# Patient Record
Sex: Female | Born: 1980
Health system: Southern US, Community
[De-identification: ages and names within clinical notes are randomized; demographics above are authoritative.]

## PROBLEM LIST (undated history)

## (undated) ENCOUNTER — Emergency Department (HOSPITAL_COMMUNITY): Disposition: A | Discharge status: Home / Self Care | Payer: Commercial Managed Care - HMO

## (undated) ENCOUNTER — Inpatient Hospital Stay (HOSPITAL_COMMUNITY): Payer: Self-pay

## (undated) DIAGNOSIS — F32A Depression, unspecified: Secondary | ICD-10-CM

## (undated) DIAGNOSIS — F419 Anxiety disorder, unspecified: Secondary | ICD-10-CM

## (undated) DIAGNOSIS — R896 Abnormal cytological findings in specimens from other organs, systems and tissues: Secondary | ICD-10-CM

## (undated) DIAGNOSIS — N200 Calculus of kidney: Secondary | ICD-10-CM

## (undated) DIAGNOSIS — R112 Nausea with vomiting, unspecified: Secondary | ICD-10-CM

## (undated) DIAGNOSIS — O139 Gestational [pregnancy-induced] hypertension without significant proteinuria, unspecified trimester: Secondary | ICD-10-CM

## (undated) DIAGNOSIS — R87629 Unspecified abnormal cytological findings in specimens from vagina: Secondary | ICD-10-CM

## (undated) DIAGNOSIS — K219 Gastro-esophageal reflux disease without esophagitis: Secondary | ICD-10-CM

## (undated) DIAGNOSIS — F988 Other specified behavioral and emotional disorders with onset usually occurring in childhood and adolescence: Secondary | ICD-10-CM

## (undated) DIAGNOSIS — F329 Major depressive disorder, single episode, unspecified: Secondary | ICD-10-CM

## (undated) DIAGNOSIS — Z8619 Personal history of other infectious and parasitic diseases: Secondary | ICD-10-CM

## (undated) DIAGNOSIS — Z9889 Other specified postprocedural states: Secondary | ICD-10-CM

## (undated) DIAGNOSIS — F319 Bipolar disorder, unspecified: Secondary | ICD-10-CM

## (undated) DIAGNOSIS — Z8759 Personal history of other complications of pregnancy, childbirth and the puerperium: Secondary | ICD-10-CM

## (undated) HISTORY — DX: Personal history of other infectious and parasitic diseases: Z86.19

## (undated) HISTORY — PX: WISDOM TOOTH EXTRACTION: SHX21

## (undated) HISTORY — DX: Abnormal cytological findings in specimens from other organs, systems and tissues: R89.6

## (undated) HISTORY — DX: Personal history of other complications of pregnancy, childbirth and the puerperium: Z87.59

## (undated) HISTORY — DX: Gastro-esophageal reflux disease without esophagitis: K21.9

## (undated) HISTORY — DX: Other specified behavioral and emotional disorders with onset usually occurring in childhood and adolescence: F98.8

## (undated) HISTORY — PX: KNEE ARTHROSCOPY: SUR90

## (undated) HISTORY — DX: Bipolar disorder, unspecified: F31.9

---
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
## 2001-06-19 ENCOUNTER — Other Ambulatory Visit: Admission: RE | Admit: 2001-06-19 | Discharge: 2001-06-19 | Payer: Self-pay | Admitting: Gynecology

## 2001-12-31 ENCOUNTER — Other Ambulatory Visit: Admission: RE | Admit: 2001-12-31 | Discharge: 2001-12-31 | Payer: Self-pay | Admitting: Gynecology

## 2002-06-25 ENCOUNTER — Other Ambulatory Visit: Admission: RE | Admit: 2002-06-25 | Discharge: 2002-06-25 | Payer: Self-pay | Admitting: Gynecology

## 2002-12-24 ENCOUNTER — Other Ambulatory Visit: Admission: RE | Admit: 2002-12-24 | Discharge: 2002-12-24 | Payer: Self-pay | Admitting: Obstetrics and Gynecology

## 2003-12-26 ENCOUNTER — Other Ambulatory Visit: Admission: RE | Admit: 2003-12-26 | Discharge: 2003-12-26 | Payer: Self-pay | Admitting: Gynecology

## 2004-12-26 ENCOUNTER — Other Ambulatory Visit: Admission: RE | Admit: 2004-12-26 | Discharge: 2004-12-26 | Payer: Self-pay | Admitting: Gynecology

## 2005-08-05 DIAGNOSIS — Z8742 Personal history of other diseases of the female genital tract: Secondary | ICD-10-CM

## 2005-08-05 HISTORY — DX: Personal history of other diseases of the female genital tract: Z87.42

## 2005-12-20 ENCOUNTER — Other Ambulatory Visit: Admission: RE | Admit: 2005-12-20 | Discharge: 2005-12-20 | Payer: Self-pay | Admitting: Gynecology

## 2006-05-05 HISTORY — PX: THERAPEUTIC ABORTION: SHX798

## 2007-08-31 ENCOUNTER — Other Ambulatory Visit: Admission: RE | Admit: 2007-08-31 | Discharge: 2007-08-31 | Payer: Self-pay | Admitting: Obstetrics and Gynecology

## 2008-09-12 ENCOUNTER — Encounter: Payer: Self-pay | Admitting: Obstetrics and Gynecology

## 2008-09-12 ENCOUNTER — Ambulatory Visit: Payer: Self-pay | Admitting: Obstetrics and Gynecology

## 2008-09-12 ENCOUNTER — Other Ambulatory Visit: Admission: RE | Admit: 2008-09-12 | Discharge: 2008-09-12 | Payer: Self-pay | Admitting: Obstetrics and Gynecology

## 2009-09-11 ENCOUNTER — Ambulatory Visit: Payer: Self-pay | Admitting: Obstetrics and Gynecology

## 2009-09-11 ENCOUNTER — Other Ambulatory Visit: Admission: RE | Admit: 2009-09-11 | Discharge: 2009-09-11 | Payer: Self-pay | Admitting: Obstetrics and Gynecology

## 2010-07-20 ENCOUNTER — Other Ambulatory Visit
Admission: RE | Admit: 2010-07-20 | Discharge: 2010-07-20 | Payer: Self-pay | Source: Home / Self Care | Admitting: Gynecology

## 2010-07-20 ENCOUNTER — Ambulatory Visit: Payer: Self-pay | Admitting: Gynecology

## 2010-10-12 ENCOUNTER — Ambulatory Visit (INDEPENDENT_AMBULATORY_CARE_PROVIDER_SITE_OTHER): Payer: 59 | Admitting: Women's Health

## 2010-10-12 DIAGNOSIS — R823 Hemoglobinuria: Secondary | ICD-10-CM

## 2010-10-12 DIAGNOSIS — B373 Candidiasis of vulva and vagina: Secondary | ICD-10-CM

## 2010-10-12 DIAGNOSIS — N898 Other specified noninflammatory disorders of vagina: Secondary | ICD-10-CM

## 2011-01-09 ENCOUNTER — Ambulatory Visit (INDEPENDENT_AMBULATORY_CARE_PROVIDER_SITE_OTHER): Payer: 59 | Admitting: Gynecology

## 2011-01-09 DIAGNOSIS — L293 Anogenital pruritus, unspecified: Secondary | ICD-10-CM

## 2011-01-09 DIAGNOSIS — N898 Other specified noninflammatory disorders of vagina: Secondary | ICD-10-CM

## 2011-01-09 DIAGNOSIS — B373 Candidiasis of vulva and vagina: Secondary | ICD-10-CM

## 2011-01-09 DIAGNOSIS — R823 Hemoglobinuria: Secondary | ICD-10-CM

## 2011-07-18 DIAGNOSIS — F319 Bipolar disorder, unspecified: Secondary | ICD-10-CM | POA: Insufficient documentation

## 2011-07-18 DIAGNOSIS — IMO0001 Reserved for inherently not codable concepts without codable children: Secondary | ICD-10-CM | POA: Insufficient documentation

## 2011-07-18 DIAGNOSIS — F988 Other specified behavioral and emotional disorders with onset usually occurring in childhood and adolescence: Secondary | ICD-10-CM | POA: Insufficient documentation

## 2011-07-25 ENCOUNTER — Ambulatory Visit (INDEPENDENT_AMBULATORY_CARE_PROVIDER_SITE_OTHER): Payer: 59 | Admitting: Gynecology

## 2011-07-25 ENCOUNTER — Encounter: Payer: Self-pay | Admitting: Gynecology

## 2011-07-25 VITALS — BP 126/74 | Ht 63.0 in | Wt 165.0 lb

## 2011-07-25 DIAGNOSIS — Z01419 Encounter for gynecological examination (general) (routine) without abnormal findings: Secondary | ICD-10-CM

## 2011-07-25 DIAGNOSIS — R102 Pelvic and perineal pain: Secondary | ICD-10-CM

## 2011-07-25 DIAGNOSIS — B3731 Acute candidiasis of vulva and vagina: Secondary | ICD-10-CM

## 2011-07-25 DIAGNOSIS — N949 Unspecified condition associated with female genital organs and menstrual cycle: Secondary | ICD-10-CM

## 2011-07-25 DIAGNOSIS — R823 Hemoglobinuria: Secondary | ICD-10-CM

## 2011-07-25 DIAGNOSIS — N898 Other specified noninflammatory disorders of vagina: Secondary | ICD-10-CM

## 2011-07-25 DIAGNOSIS — B373 Candidiasis of vulva and vagina: Secondary | ICD-10-CM

## 2011-07-25 MED ORDER — NORGESTIM-ETH ESTRAD TRIPHASIC 0.18/0.215/0.25 MG-35 MCG PO TABS: 1.0000 | ORAL_TABLET | Freq: Every day | ORAL | Status: DC

## 2011-07-25 MED ORDER — FLUCONAZOLE 150 MG PO TABS: 150.0000 mg | ORAL_TABLET | Freq: Once | ORAL | Status: AC

## 2011-07-25 NOTE — Progress Notes (Signed)
Jocelyn Waters Aug 15, 1980 [id number]        30 y.o.  for annual exam.  Does note over the past year she is having some fleeting pelvic pain comes every several days lasts just for several minutes sometimes radiates down her thighs. Not related to activity. She is having some constipation the last month due to dietary changes. Not having any dyspareunia nausea vomiting UTI symptoms. Does think that she has a yeast infection with some mild itching and she is prone to getting yeast infections.  Past medical history,surgical history, medications, allergies, family history and social history were all reviewed and documented in the EPIC chart. ROS:  Was performed and pertinent positives and negatives are included in the history.  Exam: chaperone present Filed Vitals:   07/25/11 1513  BP: 126/74   General appearance  Normal Skin grossly normal Head/Neck normal with no cervical or supraclavicular adenopathy thyroid normal Lungs  clear Cardiac RR, without RMG Abdominal  soft, nontender, without masses, organomegaly or hernia Breasts  examined lying and sitting without masses, retractions, discharge or axillary adenopathy. Pelvic  Ext/BUS/vagina  normal with white discharge  Cervix  normal    Uterus  axial, normal size, shape and contour, midline and mobile nontender   Adnexa  Without masses or tenderness    Anus and perineum  normal   Rectovaginal  normal sphincter tone without palpated masses or tenderness.    Assessment/Plan:  30 y.o. female for annual exam.    1. White discharge. KOH wet prep is positive for yeast. We'll treat with Diflucan 150x1 dose. She does have boric acid suppositories that she does use on a weekly basis and notes that that seems to help she'll continue to do so. 2. Fleeting pelvic pain.  No dyspareunia. She does have some dysmenorrhea. We'll start with ultrasound and then go from there. Possibilities for endometriosis was discussed. If ultrasound is normal options for  laparoscopy were reviewed and we'll rediscuss after the ultrasound. Will check urinalysis. 3. Birth control. She is doing well on birth control pills wants to continue I refilled her tri-Sprintec times a year. 4. Breast self. SBE monthly reviewed. 5. Pap smear. I did not do a Pap smear today. She has no history of significantly abnormal Paps in the past and has numerous normal reports in her chart the last one in 2011. We'll plan less frequent screening every 3 years per current guidelines.  6. Health maintenance. She just saw her primary and blood work was done to include cholesterol. No other blood work was done today she'll continue follow up with him for routine health maintenance.    Dara Lords MD, 4:25 PM 07/25/2011

## 2011-07-25 NOTE — Progress Notes (Signed)
Addended byCammie Mcgee T on: 07/25/2011 05:30 PM   Modules accepted: Orders

## 2011-07-25 NOTE — Patient Instructions (Signed)
Follow up for ultrasound. 

## 2011-07-27 LAB — URINE CULTURE

## 2011-08-13 ENCOUNTER — Ambulatory Visit (INDEPENDENT_AMBULATORY_CARE_PROVIDER_SITE_OTHER): Payer: 59 | Admitting: Gynecology

## 2011-08-13 ENCOUNTER — Encounter: Payer: Self-pay | Admitting: Gynecology

## 2011-08-13 ENCOUNTER — Ambulatory Visit (INDEPENDENT_AMBULATORY_CARE_PROVIDER_SITE_OTHER): Payer: 59

## 2011-08-13 DIAGNOSIS — R102 Pelvic and perineal pain: Secondary | ICD-10-CM

## 2011-08-13 DIAGNOSIS — N949 Unspecified condition associated with female genital organs and menstrual cycle: Secondary | ICD-10-CM

## 2011-08-13 DIAGNOSIS — N8300 Follicular cyst of ovary, unspecified side: Secondary | ICD-10-CM

## 2011-08-13 NOTE — Patient Instructions (Signed)
Increase daily fiber as discussed and fluids. If constipation and or abdominal pain continues then call and we will arrange for a gastroenterology referral.

## 2011-08-13 NOTE — Progress Notes (Signed)
Patient presents for ultrasound due to abdominopelvic discomfort described in her previous office visit note.  Ultrasound today shows endometrial echoes 3.5 mm with a normal uterine echo pattern. Right and left ovaries are visualized and normal. There are no abnormalities seen and no cul-de-sac fluid.  Reviewed results of ultrasound with the patient. I feel her pain is probably GI related as she is having constipation issues. I recommended daily fiber supplements with fluids. If her constipation and or abdominal pain continue, she knows to call me and I will refer to gastroenterology for further evaluation. If her symptoms clear then we'll follow. Patient's comfortable with the plan.

## 2012-01-31 ENCOUNTER — Telehealth: Payer: Self-pay | Admitting: *Deleted

## 2012-01-31 MED ORDER — FLUCONAZOLE 150 MG PO TABS: 150.0000 mg | ORAL_TABLET | Freq: Once | ORAL | Status: AC

## 2012-01-31 NOTE — Telephone Encounter (Signed)
Diflucan 150mg x 1 dose

## 2012-01-31 NOTE — Telephone Encounter (Signed)
Left on voicemail rx sent. 

## 2012-01-31 NOTE — Telephone Encounter (Signed)
Pt calling c/o recurrent yeast infection itching, white discharge x 2 days now, pt is requesting Rx. Last yeast infection back in 07/25/11. Please advise

## 2012-05-01 ENCOUNTER — Other Ambulatory Visit: Payer: Self-pay | Admitting: Gynecology

## 2012-05-01 ENCOUNTER — Ambulatory Visit (INDEPENDENT_AMBULATORY_CARE_PROVIDER_SITE_OTHER): Payer: 59 | Admitting: Gynecology

## 2012-05-01 ENCOUNTER — Encounter: Payer: Self-pay | Admitting: Gynecology

## 2012-05-01 ENCOUNTER — Telehealth: Payer: Self-pay | Admitting: Gynecology

## 2012-05-01 DIAGNOSIS — N898 Other specified noninflammatory disorders of vagina: Secondary | ICD-10-CM

## 2012-05-01 DIAGNOSIS — L738 Other specified follicular disorders: Secondary | ICD-10-CM

## 2012-05-01 DIAGNOSIS — Z309 Encounter for contraceptive management, unspecified: Secondary | ICD-10-CM

## 2012-05-01 DIAGNOSIS — Z3049 Encounter for surveillance of other contraceptives: Secondary | ICD-10-CM

## 2012-05-01 DIAGNOSIS — L678 Other hair color and hair shaft abnormalities: Secondary | ICD-10-CM

## 2012-05-01 DIAGNOSIS — L739 Follicular disorder, unspecified: Secondary | ICD-10-CM

## 2012-05-01 LAB — WET PREP FOR TRICH, YEAST, CLUE
Clue Cells Wet Prep HPF POC: NONE SEEN
Yeast Wet Prep HPF POC: NONE SEEN

## 2012-05-01 MED ORDER — LEVONORGESTREL 20 MCG/24HR IU IUD: INTRAUTERINE_SYSTEM | Freq: Once | INTRAUTERINE | Status: DC

## 2012-05-01 NOTE — Progress Notes (Signed)
Patient presents with several issues: 1. Vaginal discharge with some irritation. No odor. Just started period today. 2. ? Ingrown hair off although with discomfort started several days ago. 3. Contraceptive management with questions about options.  Exam with Fleet Contras assistant External with 1-2 cm boil/folliculitis left upper labia majora with some fluctuance, overlying erythema no pointing. No overt cellulitis or inguinal adenopathy. Vagina with cakey discharge and blood. Cervix normal. Uterus normal size midline mobile nontender. Adnexa without masses or tenderness.  Assessment and plan: 1. Vaginal discharge. Wet prep is unremarkable although she is starting.Jovita Gamma her sample of Gynazole-1 to treat suspected yeast. Follow up if symptoms persist or recur. 2. Folliculitis/boil. Recommended warm soaks to the area. If worsens or spreads office visit for reevaluation. If drains and resolves and follow. Do not feel antibiotics at this point warranted. 3. Contraceptive management. I reviewed options with her to include patch ring, Implanon, IUDs both Mirena and ParaGard, sterilization. She's contemplating marriage with possible pregnancy in the future. I gave her literature on Mirena and she is interested in considering initial check with her insurance. I reviewed the insertional process with her and the general risks.  Patient will follow up if she decides to pursue otherwise she is due for her annual in December and knows to schedule this and follow up at that time.

## 2012-05-01 NOTE — Telephone Encounter (Signed)
Pt advised that her [employer] ins covers the Mirena IUD & its insertion at 100%,no copay.Appt is scheduled for 05/04/12 with TF/WL

## 2012-05-01 NOTE — Patient Instructions (Signed)
Warm compresses to vulvar area.  Office visit if area remains tender or enlarges.

## 2012-05-04 ENCOUNTER — Ambulatory Visit (INDEPENDENT_AMBULATORY_CARE_PROVIDER_SITE_OTHER): Payer: 59 | Admitting: Gynecology

## 2012-05-04 ENCOUNTER — Encounter: Payer: Self-pay | Admitting: Gynecology

## 2012-05-04 DIAGNOSIS — Z30430 Encounter for insertion of intrauterine contraceptive device: Secondary | ICD-10-CM

## 2012-05-04 HISTORY — PX: INTRAUTERINE DEVICE INSERTION: SHX323

## 2012-05-04 NOTE — Patient Instructions (Addendum)
Intrauterine Device Insertion Most often, an intrauterine device (IUD) is inserted into the uterus to prevent pregnancy. There are 2 types of IUDs available:  Copper IUD. This type of IUD creates an environment that is not favorable to sperm survival. The mechanism of action of the copper IUD is not known for certain. It can stay in place for 10 years.   Hormone IUD. This type of IUD contains the hormone progestin (synthetic progesterone). The progestin thickens the cervical mucus and prevents sperm from entering the uterus, and it also thins the uterine lining. There is no evidence that the hormone IUD prevents implantation. The hormone IUD can stay in place for up to 5 years.  An IUD is the most cost-effective birth control if left in place for the full duration. It may be removed at any time. LET YOUR CAREGIVER KNOW ABOUT:  Sensitivity to metals.   Medicines taken including herbs, eyedrops, over-the-counter medicines, and creams.   Use of steroids (by mouth or creams).   Previous problems with anesthetics or numbing medicine.   Previous gynecological surgery.   History of blood clots or clotting disorders.   Possibility of pregnancy.   Menstrual irregularities.   Concerns regarding unusual vaginal discharge or odors.   Previous experience with an IUD.   Other health problems.  RISKS AND COMPLICATIONS  Accidental puncture (perforation) of the uterus.   Accidental placement of the IUD either in the muscle layer of the uterus (myometrium) or outside the uterus. If this happen, the IUD can be found essentially floating around the bowels. When this happens, the IUD must be taken out surgically.   The IUD may fall out of the uterus (expulsion). This is more common in women who have recently had a child.    Pregnancy in the fallopian tube (ectopic).  BEFORE THE PROCEDURE  Schedule the IUD insertion for when you will have your menstrual period or right after, to make sure you  are not pregnant. Placement of the IUD is better tolerated shortly after a menstrual cycle.   You may need to take tests or be examined to make sure you are not pregnant.   You may be required to take a pregnancy test.   You may be required to get checked for sexually transmitted infections (STIs) prior to placement. Placing an IUD in someone who has an infection can make an infection worse.   You may be given a pain reliever to take 1 or 2 hours before the procedure.   An exam will be performed to determine the size and position of your uterus.   Ask your caregiver about changing or stopping your regular medicines.  PROCEDURE   A tool (speculum) is placed in the vagina. This allows your caregiver to see the lower part of the uterus (cervix).   The cervix is prepped with a medicine that lowers the risk of infection.   You may be given a medicine to numb each side of the cervix (intracervical or paracervical block). This is used to block and control any discomfort with insertion.   A tool (uterine sound) is inserted into the uterus to determine the length of the uterine cavity and the direction the uterus may be tilted.   A slim instrument (IUD inserter) is inserted through the cervical canal and into your uterus.   The IUD is placed in the uterine cavity and the insertion device is removed.   The nylon string that is attached to the IUD, and used for   eventual IUD removal, is trimmed. It is trimmed so that it lays high in the vagina, just outside the cervix.  AFTER THE PROCEDURE  You may have bleeding after the procedure. This is normal. It varies from light spotting for a few days to menstrual-like bleeding.   You may have mild cramping.   Practice checking the string coming out of the cervix to make sure the IUD remains in the uterus. If you cannot feel the string, you should schedule a "string check" with your caregiver.   If you had a hormone IUD inserted, expect that your  period may be lighter or nonexistent within a year's time (though this is not always the case). There may be delayed fertility with the hormone IUD as a result of its progesterone effect. When you are ready to become pregnant, it is suggested to have the IUD removed up to 1 year in advance.   Yearly exams are advised.  Document Released: 03/20/2011 Document Revised: 07/11/2011 Document Reviewed: 03/20/2011 Exit[employer] Patient Information 2012 [employer]. 

## 2012-05-04 NOTE — Progress Notes (Signed)
Patient presents for Mirena IUD placement. She has read through the booklet, has no contraindications and signed the consent form. She currently is on a normal menses.  I reviewed the insertional process with her as well as the risks to include infection either immediate or long-term, uterine perforation or migration requiring surgery to remove, other complications such as pain, hormonal side effects, infertility and possibilities of failure with subsequent pregnancy.    Exam with Sherrilyn Rist assistant Pelvic: External BUS vagina normal. Cervix normal with normal menses flow. Uterus anteverted, normal size shape contour midline mobile nontender. Adnexa without masses or tenderness.  Procedure: The cervix was cleansed with Betadine, anterior lip grasped with a single-tooth tenaculum, the uterus was sounded and a Mirena IUD was placed according to manufacturer's recommendations without difficulty. The strings were trimmed. The patient tolerated well and will follow up in one month for a postinsertional check.  Lot number: [id number]

## 2012-05-05 ENCOUNTER — Encounter: Payer: Self-pay | Admitting: Gynecology

## 2012-06-01 ENCOUNTER — Ambulatory Visit (INDEPENDENT_AMBULATORY_CARE_PROVIDER_SITE_OTHER): Payer: 59 | Admitting: Gynecology

## 2012-06-01 ENCOUNTER — Encounter: Payer: Self-pay | Admitting: Gynecology

## 2012-06-01 DIAGNOSIS — Z30431 Encounter for routine checking of intrauterine contraceptive device: Secondary | ICD-10-CM

## 2012-06-01 NOTE — Patient Instructions (Signed)
Intrauterine Device Insertion Most often, an intrauterine device (IUD) is inserted into the uterus to prevent pregnancy. There are 2 types of IUDs available:  Copper IUD. This type of IUD creates an environment that is not favorable to sperm survival. The mechanism of action of the copper IUD is not known for certain. It can stay in place for 10 years.  Hormone IUD. This type of IUD contains the hormone progestin (synthetic progesterone). The progestin thickens the cervical mucus and prevents sperm from entering the uterus, and it also thins the uterine lining. There is no evidence that the hormone IUD prevents implantation. The hormone IUD can stay in place for up to 5 years. An IUD is the most cost-effective birth control if left in place for the full duration. It may be removed at any time. LET YOUR CAREGIVER KNOW ABOUT:  Sensitivity to metals.  Medicines taken including herbs, eyedrops, over-the-counter medicines, and creams.  Use of steroids (by mouth or creams).  Previous problems with anesthetics or numbing medicine.  Previous gynecological surgery.  History of blood clots or clotting disorders.  Possibility of pregnancy.  Menstrual irregularities.  Concerns regarding unusual vaginal discharge or odors.  Previous experience with an IUD.  Other health problems. RISKS AND COMPLICATIONS  Accidental puncture (perforation) of the uterus.  Accidental placement of the IUD either in the muscle layer of the uterus (myometrium) or outside the uterus. If this happen, the IUD can be found essentially floating around the bowels. When this happens, the IUD must be taken out surgically.  The IUD may fall out of the uterus (expulsion). This is more common in women who have recently had a child.   Pregnancy in the fallopian tube (ectopic). BEFORE THE PROCEDURE  Schedule the IUD insertion for when you will have your menstrual period or right after, to make sure you are not pregnant.  Placement of the IUD is better tolerated shortly after a menstrual cycle.  You may need to take tests or be examined to make sure you are not pregnant.  You may be required to take a pregnancy test.  You may be required to get checked for sexually transmitted infections (STIs) prior to placement. Placing an IUD in someone who has an infection can make an infection worse.  You may be given a pain reliever to take 1 or 2 hours before the procedure.  An exam will be performed to determine the size and position of your uterus.  Ask your caregiver about changing or stopping your regular medicines. PROCEDURE   A tool (speculum) is placed in the vagina. This allows your caregiver to see the lower part of the uterus (cervix).  The cervix is prepped with a medicine that lowers the risk of infection.  You may be given a medicine to numb each side of the cervix (intracervical or paracervical block). This is used to block and control any discomfort with insertion.  A tool (uterine sound) is inserted into the uterus to determine the length of the uterine cavity and the direction the uterus may be tilted.  A slim instrument (IUD inserter) is inserted through the cervical canal and into your uterus.  The IUD is placed in the uterine cavity and the insertion device is removed.  The nylon string that is attached to the IUD, and used for eventual IUD removal, is trimmed. It is trimmed so that it lays high in the vagina, just outside the cervix. AFTER THE PROCEDURE  You may have bleeding after the  attached to the IUD, and used for eventual IUD removal, is trimmed. It is trimmed so that it lays high in the vagina, just outside the cervix.  AFTER THE PROCEDURE  · You may have bleeding after the procedure. This is normal. It varies from light spotting for a few days to menstrual-like bleeding.   · You may have mild cramping.  · Practice checking the string coming out of the cervix to make sure the IUD remains in the uterus. If you cannot feel the string, you should schedule a "string check" with your caregiver.  · If you had a hormone IUD inserted, expect that your period may be lighter or nonexistent  within a year's time (though this is not always the case). There may be delayed fertility with the hormone IUD as a result of its progesterone effect. When you are ready to become pregnant, it is suggested to have the IUD removed up to 1 year in advance.  · Yearly exams are advised.  Document Released: 03/20/2011 Document Revised: 10/14/2011 Document Reviewed: 03/20/2011  Exit[employer]® Patient Information ©2013 [employer].

## 2012-06-01 NOTE — Progress Notes (Signed)
Patient presents for IUD follow up. That Mirena placed 05/04/2012. Has had some spotting on and off but overall is doing well.  Exam with Sherrilyn Rist Asst. Abdomen soft nontender without masses guarding rebound organomegaly. Pelvic external BUS vagina with dark brown staining. Cervix normal with IUD string visualized and appropriate length. Uterus normal size midline mobile nontender. Adnexa without masses or tenderness.  Assessment and plan: IUD check up doing well. Assuming spotting resolves, she's due for her annual in December and will follow up for this. Follow up sooner if any issues.

## 2012-07-30 ENCOUNTER — Encounter: Payer: Self-pay | Admitting: Gynecology

## 2012-07-30 ENCOUNTER — Ambulatory Visit (INDEPENDENT_AMBULATORY_CARE_PROVIDER_SITE_OTHER): Payer: 59 | Admitting: Gynecology

## 2012-07-30 VITALS — BP 124/82 | Ht 63.0 in | Wt 145.0 lb

## 2012-07-30 DIAGNOSIS — Z13220 Encounter for screening for lipoid disorders: Secondary | ICD-10-CM

## 2012-07-30 DIAGNOSIS — Z131 Encounter for screening for diabetes mellitus: Secondary | ICD-10-CM

## 2012-07-30 DIAGNOSIS — Z01419 Encounter for gynecological examination (general) (routine) without abnormal findings: Secondary | ICD-10-CM

## 2012-07-30 DIAGNOSIS — F3289 Other specified depressive episodes: Secondary | ICD-10-CM

## 2012-07-30 DIAGNOSIS — F329 Major depressive disorder, single episode, unspecified: Secondary | ICD-10-CM

## 2012-07-30 LAB — CBC WITH DIFFERENTIAL/PLATELET
Basophils Relative: 1 % (ref 0–1)
Eosinophils Absolute: 0.1 10*3/uL (ref 0.0–0.7)
HCT: 39.9 % (ref 36.0–46.0)
Hemoglobin: 13.7 g/dL (ref 12.0–15.0)
Lymphs Abs: 2.1 10*3/uL (ref 0.7–4.0)
MCH: 29.3 pg (ref 26.0–34.0)
MCHC: 34.3 g/dL (ref 30.0–36.0)
MCV: 85.4 fL (ref 78.0–100.0)
Monocytes Absolute: 0.5 10*3/uL (ref 0.1–1.0)
Monocytes Relative: 7 % (ref 3–12)
Neutrophils Relative %: 60 % (ref 43–77)
RBC: 4.67 MIL/uL (ref 3.87–5.11)

## 2012-07-30 LAB — GLUCOSE, RANDOM: Glucose, Bld: 82 mg/dL (ref 70–99)

## 2012-07-30 LAB — LIPID PANEL
HDL: 48 mg/dL (ref >39)
Total CHOL/HDL Ratio: 3.6 Ratio
VLDL: 37 mg/dL (ref 0–40)

## 2012-07-30 LAB — TSH: TSH: 1.6 u[IU]/mL (ref 0.350–4.500)

## 2012-07-30 LAB — FOLLICLE STIMULATING HORMONE: FSH: 9.3 m[IU]/mL

## 2012-07-30 NOTE — Patient Instructions (Signed)
You can obtain your lab results by logging on to my chart. Follow up in one year, sooner as needed.

## 2012-07-30 NOTE — Progress Notes (Signed)
Jocelyn Waters Dec 16, 1980 [id number]        31 y.o.  G1P0010 for annual exam.  Several issues noted below.  Past medical history,surgical history, medications, allergies, family history and social history were all reviewed and documented in the EPIC chart. ROS:  Was performed and pertinent positives and negatives are included in the history.  Exam: Fleet Contras assistant Filed Vitals:   07/30/12 1037 07/30/12 1102  BP: 142/90 124/82  Height: 5\' 3"  (1.6 m)   Weight: 145 lb (65.772 kg)    General appearance  Normal Skin grossly normal Head/Neck normal with no cervical or supraclavicular adenopathy thyroid normal Lungs  clear Cardiac RR, without RMG Abdominal  soft, nontender, without masses, organomegaly or hernia Breasts  examined lying and sitting without masses, retractions, discharge or axillary adenopathy. Pelvic  Ext/BUS/vagina  normal   Cervix  normal IUD string visualized  Uterus  anteverted, normal size, shape and contour, midline and mobile nontender   Adnexa  Without masses or tenderness    Anus and perineum  normal   Rectovaginal  normal sphincter tone without palpated masses or tenderness.    Assessment/Plan:  31 y.o. G33P0010 female for annual exam.   1. Mirena IUD 04/2012. Scant to absent menses. Overall doing well we'll continue to monitor. IUD string visualized. 2. Anxiety/depression. Having a slight flare now. Wondered if it was hormonally related. Is being followed for bipolar disorder and ADD.  Check baseline TSH FSH. Otherwise recommended follow up with her psychiatry group who she sees on a regular basis. 3. Breast health. SBE monthly reviewed. 4. Pap smear 07/2010. No Pap smear done today. No history of significant abnormal Pap smears. Plan repeat next year at 3 year interval. 5. Health maintenance. Blood pressure initially 142/90. Retake 124/82. Baseline CBC lipid profile glucose urinalysis ordered. Follow up one year, sooner as needed.    Dara Lords MD,  11:23 AM 07/30/2012

## 2012-07-31 LAB — URINALYSIS W MICROSCOPIC + REFLEX CULTURE
Bilirubin Urine: NEGATIVE
Glucose, UA: NEGATIVE mg/dL
Protein, ur: NEGATIVE mg/dL
Urobilinogen, UA: 0.2 mg/dL (ref 0.0–1.0)

## 2012-08-01 LAB — URINE CULTURE: Colony Count: 5000

## 2012-09-14 ENCOUNTER — Encounter: Payer: Self-pay | Admitting: Gynecology

## 2012-12-21 ENCOUNTER — Ambulatory Visit (INDEPENDENT_AMBULATORY_CARE_PROVIDER_SITE_OTHER): Payer: 59 | Admitting: Women's Health

## 2012-12-21 ENCOUNTER — Encounter: Payer: Self-pay | Admitting: Women's Health

## 2012-12-21 DIAGNOSIS — R102 Pelvic and perineal pain: Secondary | ICD-10-CM

## 2012-12-21 DIAGNOSIS — N949 Unspecified condition associated with female genital organs and menstrual cycle: Secondary | ICD-10-CM

## 2012-12-21 LAB — URINALYSIS W MICROSCOPIC + REFLEX CULTURE
Bacteria, UA: NONE SEEN
Bilirubin Urine: NEGATIVE
Glucose, UA: NEGATIVE mg/dL
Ketones, ur: NEGATIVE mg/dL
Protein, ur: NEGATIVE mg/dL
Urobilinogen, UA: 0.2 mg/dL (ref 0.0–1.0)
WBC, UA: NONE SEEN WBC/hpf (ref <3)

## 2012-12-21 LAB — WET PREP FOR TRICH, YEAST, CLUE
Trich, Wet Prep: NONE SEEN
Yeast Wet Prep HPF POC: NONE SEEN

## 2012-12-21 MED ORDER — IBUPROFEN 600 MG PO TABS: 600.0000 mg | ORAL_TABLET | Freq: Three times a day (TID) | ORAL | Status: DC | PRN

## 2012-12-21 NOTE — Progress Notes (Signed)
Patient ID: Jocelyn Waters, female   DOB: 12/01/1980, 32 y.o.   MRN: [id number] Presents with complaint of pelvic pressure with a pinching sensation for several weeks. Rates pain at 4-5. Mirena IUD placed 04/2012 with rare spotting. Has had some nausea, no vomiting, denies urinary symptoms of pain, burning or frequency. Denies a fever. Same partner.  Exam: Appears well, external genitalia within normal limits, speculum exam scant white discharge, wet prep negative, IUD string not visible, bimanual - tenderness throughout the exam, no adnexal fullness. UA: Negative. U PT negative.  Pelvic pain History of anxiety/bipolar disease  Plan: Motrin 600 every 8 hours as needed for pain, schedule ultrasound.

## 2012-12-22 LAB — URINE CULTURE
Colony Count: NO GROWTH
Organism ID, Bacteria: NO GROWTH

## 2012-12-23 ENCOUNTER — Ambulatory Visit (INDEPENDENT_AMBULATORY_CARE_PROVIDER_SITE_OTHER): Payer: 59 | Admitting: Women's Health

## 2012-12-23 ENCOUNTER — Ambulatory Visit (INDEPENDENT_AMBULATORY_CARE_PROVIDER_SITE_OTHER): Payer: 59

## 2012-12-23 ENCOUNTER — Other Ambulatory Visit: Payer: Self-pay | Admitting: Women's Health

## 2012-12-23 DIAGNOSIS — R102 Pelvic and perineal pain: Secondary | ICD-10-CM

## 2012-12-23 DIAGNOSIS — N83209 Unspecified ovarian cyst, unspecified side: Secondary | ICD-10-CM

## 2012-12-23 DIAGNOSIS — N949 Unspecified condition associated with female genital organs and menstrual cycle: Secondary | ICD-10-CM

## 2012-12-23 DIAGNOSIS — Z975 Presence of (intrauterine) contraceptive device: Secondary | ICD-10-CM

## 2012-12-23 DIAGNOSIS — N839 Noninflammatory disorder of ovary, fallopian tube and broad ligament, unspecified: Secondary | ICD-10-CM

## 2012-12-23 DIAGNOSIS — N831 Corpus luteum cyst of ovary, unspecified side: Secondary | ICD-10-CM

## 2012-12-23 DIAGNOSIS — N8300 Follicular cyst of ovary, unspecified side: Secondary | ICD-10-CM

## 2012-12-23 NOTE — Progress Notes (Signed)
Patient ID: Jocelyn Waters, female   DOB: 11-Jan-1981, 32 y.o.   MRN: [id number] Presents for ultrasound. Was seen in the office with complaint of pinching sensation/ pain with certain movements 2 days ago. Mirena IUD with rare cycle. History of anxiety.  Ultrasound: Anteverted uterus with IUD seen in normal position. Uterus deviated slightly to the right. Right ovary with thickwalled cystic mass with irregular shape border, avascular 31 x 23 x 18 mm. Left ovary normal. Negative cul-de-sac.  Probable functional cyst.   Plan: Continue Motrin 600 every 8 hours as needed. Repeat ultrasound in 2 months to check for resolution of cyst.

## 2013-02-15 ENCOUNTER — Other Ambulatory Visit: Payer: 59

## 2013-02-15 ENCOUNTER — Encounter: Payer: Self-pay | Admitting: Women's Health

## 2013-02-15 ENCOUNTER — Ambulatory Visit: Payer: 59 | Admitting: Women's Health

## 2013-02-15 ENCOUNTER — Ambulatory Visit (INDEPENDENT_AMBULATORY_CARE_PROVIDER_SITE_OTHER): Payer: 59

## 2013-02-15 ENCOUNTER — Ambulatory Visit (INDEPENDENT_AMBULATORY_CARE_PROVIDER_SITE_OTHER): Payer: 59 | Admitting: Women's Health

## 2013-02-15 DIAGNOSIS — N8300 Follicular cyst of ovary, unspecified side: Secondary | ICD-10-CM

## 2013-02-15 DIAGNOSIS — N83209 Unspecified ovarian cyst, unspecified side: Secondary | ICD-10-CM

## 2013-02-15 NOTE — Progress Notes (Signed)
Patient ID: Jocelyn Waters, female   DOB: February 28, 1981, 32 y.o.   MRN: [id number] Presents for followup ultrasound. Was noted to have an avascular 31 x 23 x 18 mm cyst 12/2012 that was causing pain. Mirena IUD with rare cycles. Pain free, no complaints today  Ultrasound: Anteverted uterus deviates to the right. IUD seen in normal position. Right and left ovary normal with follicles. Previous cyst is resolved.  Normal GYN ultrasound with IUD  Plan: Reviewed normality of ultrasound. Annual exam in December or if problems sooner.

## 2013-06-05 HISTORY — PX: IUD REMOVAL: SHX5392

## 2013-06-10 ENCOUNTER — Other Ambulatory Visit: Payer: Self-pay

## 2013-06-22 ENCOUNTER — Ambulatory Visit (INDEPENDENT_AMBULATORY_CARE_PROVIDER_SITE_OTHER): Payer: 59 | Admitting: Women's Health

## 2013-06-22 ENCOUNTER — Encounter: Payer: Self-pay | Admitting: Women's Health

## 2013-06-22 DIAGNOSIS — Z30432 Encounter for removal of intrauterine contraceptive device: Secondary | ICD-10-CM

## 2013-06-22 DIAGNOSIS — Z975 Presence of (intrauterine) contraceptive device: Secondary | ICD-10-CM

## 2013-06-22 NOTE — Progress Notes (Signed)
Patient ID: Jocelyn Waters, female   DOB: 1980/11/25, 32 y.o.   MRN: [id number] Presents to have Mirena IUD removed. Rare cycle, IUD placed September 2013. Would like to try to conceive.   Exam: Appears well, external genitalia within normal limits, speculum exam cervix pink IUD strings not visible IUD grasped with curved forceps in cervical os removed intact shown to patient and discarded. Tolerated well. Bimanual no CMT or adnexal tenderness.  Mirena IUD removal/Preconceptual counseling  Plan: Prenatal vitamin daily, avoid over-the-counter medications, alcohol,  Adderall or Xanax when trying to conceive. Safe pregnancy behaviors reviewed. Reviewed we no longer deliver, viability ultrasound after missed cycle. Reviewed may take several months to get a regular cycle.

## 2013-08-04 ENCOUNTER — Encounter: Payer: 59 | Admitting: Women's Health

## 2013-08-18 ENCOUNTER — Encounter: Payer: Self-pay | Admitting: Women's Health

## 2013-08-18 ENCOUNTER — Ambulatory Visit (INDEPENDENT_AMBULATORY_CARE_PROVIDER_SITE_OTHER): Payer: 59 | Admitting: Women's Health

## 2013-08-18 ENCOUNTER — Other Ambulatory Visit (HOSPITAL_COMMUNITY)
Admission: RE | Admit: 2013-08-18 | Discharge: 2013-08-18 | Disposition: A | Discharge status: Home / Self Care | Payer: 59 | Source: Ambulatory Visit | Attending: Gynecology | Admitting: Gynecology

## 2013-08-18 VITALS — BP 122/72 | Ht 62.75 in | Wt 168.4 lb

## 2013-08-18 DIAGNOSIS — Z01419 Encounter for gynecological examination (general) (routine) without abnormal findings: Secondary | ICD-10-CM

## 2013-08-18 NOTE — Addendum Note (Signed)
Addended by: Richardson ChiquitoWILKINSON, KARI S on: 08/18/2013 02:53 PM   Modules accepted: Orders

## 2013-08-18 NOTE — Patient Instructions (Signed)

## 2013-08-18 NOTE — Progress Notes (Signed)
Jocelyn Waters 03/12/1981 [id number]    History:    The patient presents for annual exam.  Mirena IUD removed 06/2013 and having regular monthly cycle since. Desiring conception. Normal Pap history. Aware of safe pregnancy behaviors. Currently on prenatal vitamin only. History of anxiety and depression in the past.  Past medical history, past surgical history, family history and social history were all reviewed and documented in the EPIC chart.   ROS:  A  ROS was performed and pertinent positives and negatives are included in the history.  Exam:  Filed Vitals:   08/18/13 0945  BP: 122/72    General appearance:  Normal Head/Neck:  Normal, without cervical or supraclavicular adenopathy. Thyroid:  Symmetrical, normal in size, without palpable masses or nodularity. Respiratory  Effort:  Normal  Auscultation:  Clear without wheezing or rhonchi Cardiovascular  Auscultation:  Regular rate, without rubs, murmurs or gallops  Edema/varicosities:  Not grossly evident Abdominal  Soft,nontender, without masses, guarding or rebound.  Liver/spleen:  No organomegaly noted  Hernia:  None appreciated  Skin  Inspection:  Grossly normal  Palpation:  Grossly normal Neurologic/psychiatric  Orientation:  Normal with appropriate conversation.  Mood/affect:  Normal  Genitourinary    Breasts: Examined lying and sitting.     Right: Without masses, retractions, discharge or axillary adenopathy.     Left: Without masses, retractions, discharge or axillary adenopathy.   Inguinal/mons:  Normal without inguinal adenopathy  External genitalia:  Normal  BUS/Urethra/Skene's glands:  Normal  Bladder:  Normal  Vagina:  Normal  Cervix:  Normal  Uterus:   normal in size, shape and contour.  Midline and mobile  Adnexa/parametria:     Rt: Without masses or tenderness.   Lt: Without masses or tenderness.  Anus and perineum: Normal  Digital rectal exam: Normal sphincter tone without palpated masses or  tenderness  Assessment/Plan:  33 y.o. MWF G0 for annual exam.    Desiring conception History of anxiety and depression on no medication  Plan: Reviewed ovulation predictors, instructed to call or return to office with missed cycle for viability ultrasound. Aware we no longer deliver. Continue prenatal vitamin daily, safe pregnancy behaviors. SBE's, regular exercise, calcium rich diet encouraged. CBC, UA, Pap. Pap normal 2011, new screening guidelines reviewed.    Harrington ChallengerYOUNG,NANCY J [facility] SouthwestWHNP, 10:43 AM 08/18/2013

## 2014-03-07 ENCOUNTER — Telehealth: Payer: Self-pay | Admitting: *Deleted

## 2014-03-07 NOTE — Telephone Encounter (Signed)
Pt left message regarding her questions on conception KW CMA

## 2014-03-23 NOTE — Telephone Encounter (Signed)
Pt never called back. KW CMA 

## 2014-03-30 ENCOUNTER — Ambulatory Visit (INDEPENDENT_AMBULATORY_CARE_PROVIDER_SITE_OTHER): Payer: 59 | Admitting: Women's Health

## 2014-03-30 ENCOUNTER — Encounter: Payer: Self-pay | Admitting: Women's Health

## 2014-03-30 DIAGNOSIS — N926 Irregular menstruation, unspecified: Secondary | ICD-10-CM

## 2014-03-30 LAB — TSH: TSH: 2.119 u[IU]/mL (ref 0.350–4.500)

## 2014-03-30 NOTE — Progress Notes (Signed)
Patient ID: Jocelyn Waters, female   DOB: February 17, 1981, 33 y.o.   MRN: [id number] Presents with complaint of not conceiving after 9 months of unprotected intercourse. Monthly cycle every 24-29 days for 2-3 days. States cycles are lighter than they had been in the past. Ovulation predictor kits are showing ovulation, having intercourse every other day, day 7 through 21  most months. Reports husband is healthy.  Exam: Appears well  Desiring conception  Plan: TSH and prolactin today, semen analysis will get scheduled, day 3 of next cycle FSH and estradiol, day 22-25 progesterone level. If all  tests normal proceed to a hysterosalpingogram, reviewed importance of antibiotic day before, day of, day after procedure. If all normal referral to [facility]. Continue prenatal vitamin daily and healthy lifestyle.

## 2014-03-31 ENCOUNTER — Encounter: Payer: Self-pay | Admitting: Women's Health

## 2014-03-31 LAB — PROLACTIN: Prolactin: 8 ng/mL

## 2014-04-12 ENCOUNTER — Other Ambulatory Visit: Payer: 59

## 2014-04-12 DIAGNOSIS — N926 Irregular menstruation, unspecified: Secondary | ICD-10-CM

## 2014-04-13 ENCOUNTER — Encounter: Payer: Self-pay | Admitting: Women's Health

## 2014-04-13 LAB — PROGESTERONE: PROGESTERONE: 9.9 ng/mL

## 2014-04-14 ENCOUNTER — Telehealth: Payer: Self-pay

## 2014-04-14 NOTE — Telephone Encounter (Signed)
Thanks

## 2014-04-14 NOTE — Telephone Encounter (Signed)
At Maryelizabeth Rowan, NP request I scheduled patient's husband an appointment with [facility] for 05/20/14 at 8:00am with Dr. Marcine Matar.  Semen analysis faxed to them. Koya informed.  I faxed medical records along with referral request form to Dr. Lyndal Rainbow office. Nala knows she will hear from them with an appointment date/time after they review her records.

## 2014-04-15 ENCOUNTER — Telehealth: Payer: Self-pay

## 2014-04-15 NOTE — Telephone Encounter (Signed)
Dr. Lyndal Rainbow office called. Patient's appt is 05/20/14 at 1:30pm at the [facility] office. I called patient and left message although I do believe they called her to schedule it.

## 2014-04-18 ENCOUNTER — Encounter: Payer: Self-pay | Admitting: Gynecology

## 2014-04-18 ENCOUNTER — Ambulatory Visit (INDEPENDENT_AMBULATORY_CARE_PROVIDER_SITE_OTHER): Payer: 59 | Admitting: Gynecology

## 2014-04-18 DIAGNOSIS — N912 Amenorrhea, unspecified: Secondary | ICD-10-CM

## 2014-04-18 LAB — PREGNANCY, URINE: Preg Test, Ur: POSITIVE

## 2014-04-18 NOTE — Progress Notes (Signed)
Jocelyn Waters 06-Jan-1981 [id number]        33 y.o.  G1P0010 presents 1 day late for her menses. Has been trying to achieve pregnancy. Is on a prenatal vitamin. Also takes zyrtec but no other medication.  Past medical history,surgical history, problem list, medications, allergies, family history and social history were all reviewed and documented in the EPIC chart.  Directed ROS with pertinent positives and negatives documented in the history of present illness/assessment and plan.  Exam: Kim assistant General appearance:  Normal Abdomen soft nontender without masses guarding rebound organomegaly. Pelvic external BUS vagina normal. Cervix normal. Uterus grossly normal in size midline mobile nontender. Adnexa without masses or tenderness.  Assessment/Plan:  33 y.o. G1P0010 late for her menses x1 day. Positive UPT. Schedule ultrasound for viability in 2 weeks. Routine early prenatal counseling reviewed with avoidance issues discussed. Patient understands we do not do obstetrics and will make an appointment to see an obstetrician for her new OB appointment following her ultrasound for viability.   Note: This document was prepared with digital dictation and possible smart phrase technology. Any transcriptional errors that result from this process are unintentional.   Dara Lords MD, 2:13 PM 04/18/2014

## 2014-04-18 NOTE — Patient Instructions (Signed)
Follow up for ultrasound as scheduled 

## 2014-05-04 ENCOUNTER — Ambulatory Visit (INDEPENDENT_AMBULATORY_CARE_PROVIDER_SITE_OTHER): Payer: 59 | Admitting: Gynecology

## 2014-05-04 ENCOUNTER — Ambulatory Visit (INDEPENDENT_AMBULATORY_CARE_PROVIDER_SITE_OTHER): Payer: 59

## 2014-05-04 ENCOUNTER — Encounter: Payer: Self-pay | Admitting: Gynecology

## 2014-05-04 ENCOUNTER — Other Ambulatory Visit: Payer: Self-pay | Admitting: Gynecology

## 2014-05-04 DIAGNOSIS — N831 Corpus luteum cyst of ovary, unspecified side: Secondary | ICD-10-CM

## 2014-05-04 DIAGNOSIS — O219 Vomiting of pregnancy, unspecified: Secondary | ICD-10-CM

## 2014-05-04 DIAGNOSIS — N912 Amenorrhea, unspecified: Secondary | ICD-10-CM

## 2014-05-04 LAB — US OB TRANSVAGINAL

## 2014-05-04 MED ORDER — DOXYLAMINE-PYRIDOXINE 10-10 MG PO TBEC: 2.0000 | DELAYED_RELEASE_TABLET | Freq: Every evening | ORAL | Status: DC

## 2014-05-04 NOTE — Patient Instructions (Signed)
Start the diclegis medication for the nausea. Take 2 pills at bedtime to see if this doesn't help. You can add one pill in the morning if you need control later in the day and one pill at lunchtime for a total of 4 maximum daily.

## 2014-05-04 NOTE — Progress Notes (Signed)
Jocelyn Waters Jun 16, 1981 [id number]        33 y.o.  G2P0010 presents with her husband at [redacted] weeks gestation for viability ultrasound. Is having a lot of nausea throughout the day. Has tried dietary changes but seems to be getting worse.  Past medical history,surgical history, problem list, medications, allergies, family history and social history were all reviewed and documented in the EPIC chart.  Directed ROS with pertinent positives and negatives documented in the history of present illness/assessment and plan.  Ultrasound shows single viable IUP with size equal dates at 6 weeks 3 days.  Assessment/Plan:  33 y.o. G2P0010 with single viable IUP at 6 weeks. Patient is going to make an appointment to see an obstetrician for ongoing obstetrical care within the next several weeks. Reviewed options for management of her nausea and ultimately has decided on Diclegis 2 pills at bedtime #60 with 1 refill.     Dara LordsFONTAINE,Ahsan Esterline P MD, 3:49 PM 05/04/2014

## 2014-05-09 ENCOUNTER — Telehealth: Payer: Self-pay | Admitting: *Deleted

## 2014-05-09 NOTE — Telephone Encounter (Signed)
FYI Pt is [redacted] weeks pregnant seen at [facility] on 05/04/14 for follow up ultrasound. Pt said on Saturday she noticed brown spotting when wiping only, no pain, appointment with [facility] on 04/16/14. I told pt not usually to have brown spotting in early pregnancy as long as no pain and significant amount of blood. Should anything else be related to pt?

## 2014-05-09 NOTE — Telephone Encounter (Signed)
No. I would recommend watching for now. I assume her new OB appointment is 05/16/2014 and not 04/16/2014 is in your note.

## 2014-05-09 NOTE — Telephone Encounter (Signed)
Pt informed the appointment with OB is on 05/16/14.

## 2014-05-16 ENCOUNTER — Other Ambulatory Visit: Payer: Self-pay

## 2014-06-06 ENCOUNTER — Encounter: Payer: Self-pay | Admitting: Gynecology

## 2014-06-07 ENCOUNTER — Inpatient Hospital Stay (HOSPITAL_COMMUNITY): Admission: AD | Admit: 2014-06-07 | Payer: 59 | Source: Ambulatory Visit | Admitting: Obstetrics

## 2014-06-16 LAB — OB RESULTS CONSOLE GC/CHLAMYDIA
CHLAMYDIA, DNA PROBE: NEGATIVE
Gonorrhea: NEGATIVE

## 2014-06-16 LAB — OB RESULTS CONSOLE HEPATITIS B SURFACE ANTIGEN: HEP B S AG: NEGATIVE

## 2014-06-16 LAB — OB RESULTS CONSOLE ABO/RH: RH Type: POSITIVE

## 2014-06-16 LAB — OB RESULTS CONSOLE ANTIBODY SCREEN: ANTIBODY SCREEN: NEGATIVE

## 2014-06-16 LAB — OB RESULTS CONSOLE RPR: RPR: NONREACTIVE

## 2014-06-16 LAB — OB RESULTS CONSOLE HIV ANTIBODY (ROUTINE TESTING): HIV: NONREACTIVE

## 2014-06-16 LAB — OB RESULTS CONSOLE RUBELLA ANTIBODY, IGM: Rubella: IMMUNE

## 2014-08-05 DIAGNOSIS — Z87442 Personal history of urinary calculi: Secondary | ICD-10-CM

## 2014-08-05 HISTORY — DX: Personal history of urinary calculi: Z87.442

## 2014-08-05 NOTE — L&D Delivery Note (Signed)
Delivery Note Patient pushed for 1 1/2 hours after she was noted to be C/C/+1 and at 8:35 AM a viable and healthy female was delivered via Vaginal, Spontaneous Delivery (Presentation: Right Occiput Anterior).  APGAR: 7/8 weight pending. Shoulders and body were easily delivered.   Placenta status: Intact, Spontaneous.  Cord: 3 vessels with the following complications: None.    Anesthesia: Epidural  Episiotomy: None Lacerations:  NONE  Est. Blood Loss (mL): 347  Mom to postpartum.  Baby to Couplet care / Skin to Skin.  Essie HartINN, Avondre Richens STACIA 12/09/2014, 8:55 AM

## 2014-08-19 ENCOUNTER — Inpatient Hospital Stay (HOSPITAL_COMMUNITY): Payer: 59

## 2014-08-19 ENCOUNTER — Encounter (HOSPITAL_COMMUNITY): Payer: Self-pay | Admitting: *Deleted

## 2014-08-19 ENCOUNTER — Observation Stay (HOSPITAL_COMMUNITY)
Admission: AD | Admit: 2014-08-19 | Discharge: 2014-08-19 | Disposition: A | Discharge status: Home / Self Care | Payer: 59 | Source: Ambulatory Visit | Attending: Obstetrics and Gynecology | Admitting: Obstetrics and Gynecology

## 2014-08-19 DIAGNOSIS — O9989 Other specified diseases and conditions complicating pregnancy, childbirth and the puerperium: Secondary | ICD-10-CM | POA: Diagnosis present

## 2014-08-19 DIAGNOSIS — O2302 Infections of kidney in pregnancy, second trimester: Principal | ICD-10-CM | POA: Diagnosis present

## 2014-08-19 DIAGNOSIS — Z79899 Other long term (current) drug therapy: Secondary | ICD-10-CM | POA: Diagnosis not present

## 2014-08-19 DIAGNOSIS — Z8719 Personal history of other diseases of the digestive system: Secondary | ICD-10-CM | POA: Diagnosis not present

## 2014-08-19 DIAGNOSIS — Z8659 Personal history of other mental and behavioral disorders: Secondary | ICD-10-CM | POA: Diagnosis not present

## 2014-08-19 DIAGNOSIS — R109 Unspecified abdominal pain: Secondary | ICD-10-CM

## 2014-08-19 DIAGNOSIS — Z87891 Personal history of nicotine dependence: Secondary | ICD-10-CM | POA: Insufficient documentation

## 2014-08-19 DIAGNOSIS — Z3A22 22 weeks gestation of pregnancy: Secondary | ICD-10-CM | POA: Insufficient documentation

## 2014-08-19 DIAGNOSIS — N10 Acute tubulo-interstitial nephritis: Secondary | ICD-10-CM

## 2014-08-19 LAB — URINALYSIS, ROUTINE W REFLEX MICROSCOPIC
Bilirubin Urine: NEGATIVE
Glucose, UA: NEGATIVE mg/dL
Ketones, ur: NEGATIVE mg/dL
Leukocytes, UA: NEGATIVE
Nitrite: NEGATIVE
PROTEIN: 100 mg/dL — AB
Urobilinogen, UA: 0.2 mg/dL (ref 0.0–1.0)
pH: 6 (ref 5.0–8.0)

## 2014-08-19 LAB — CBC WITH DIFFERENTIAL/PLATELET
BASOS ABS: 0 10*3/uL (ref 0.0–0.1)
BASOS PCT: 0 % (ref 0–1)
EOS PCT: 1 % (ref 0–5)
Eosinophils Absolute: 0.1 10*3/uL (ref 0.0–0.7)
HCT: 29.9 % — ABNORMAL LOW (ref 36.0–46.0)
Hemoglobin: 10.1 g/dL — ABNORMAL LOW (ref 12.0–15.0)
LYMPHS PCT: 13 % (ref 12–46)
Lymphs Abs: 1.7 10*3/uL (ref 0.7–4.0)
MCH: 29.7 pg (ref 26.0–34.0)
MCHC: 33.8 g/dL (ref 30.0–36.0)
MCV: 87.9 fL (ref 78.0–100.0)
Monocytes Absolute: 1.2 10*3/uL — ABNORMAL HIGH (ref 0.1–1.0)
Monocytes Relative: 9 % (ref 3–12)
NEUTROS ABS: 10.8 10*3/uL — AB (ref 1.7–7.7)
NEUTROS PCT: 77 % (ref 43–77)
Platelets: 202 10*3/uL (ref 150–400)
RBC: 3.4 MIL/uL — ABNORMAL LOW (ref 3.87–5.11)
RDW: 13.5 % (ref 11.5–15.5)
WBC: 13.8 10*3/uL — AB (ref 4.0–10.5)

## 2014-08-19 LAB — URINE MICROSCOPIC-ADD ON

## 2014-08-19 MED ORDER — CEFAZOLIN SODIUM 1-5 GM-% IV SOLN
1.0000 g | Freq: Three times a day (TID) | INTRAVENOUS | Status: DC
  Administered 2014-08-19 (×2): 1 g via INTRAVENOUS
  Filled 2014-08-19 (×3): qty 50

## 2014-08-19 MED ORDER — ACETAMINOPHEN 325 MG PO TABS: 650.0000 mg | ORAL_TABLET | ORAL | Status: DC | PRN

## 2014-08-19 MED ORDER — PROMETHAZINE HCL 12.5 MG PO TABS: 25.0000 mg | ORAL_TABLET | Freq: Four times a day (QID) | ORAL | Status: DC | PRN

## 2014-08-19 MED ORDER — HYDROMORPHONE HCL 1 MG/ML IJ SOLN
1.0000 mg | INTRAMUSCULAR | Status: DC | PRN
  Administered 2014-08-19 (×2): 1 mg via INTRAVENOUS
  Filled 2014-08-19: qty 1
  Filled 2014-08-19: qty 2

## 2014-08-19 MED ORDER — OXYCODONE-ACETAMINOPHEN 5-325 MG PO TABS: 1.0000 | ORAL_TABLET | ORAL | Status: DC | PRN

## 2014-08-19 MED ORDER — LACTATED RINGERS IV SOLN
INTRAVENOUS | Status: DC
  Administered 2014-08-19 (×2): via INTRAVENOUS

## 2014-08-19 MED ORDER — CALCIUM CARBONATE ANTACID 500 MG PO CHEW
2.0000 | CHEWABLE_TABLET | ORAL | Status: DC | PRN
  Filled 2014-08-19: qty 2

## 2014-08-19 MED ORDER — LACTATED RINGERS IV SOLN
INTRAVENOUS | Status: DC
  Administered 2014-08-19: 03:00:00 via INTRAVENOUS

## 2014-08-19 MED ORDER — DOCUSATE SODIUM 100 MG PO CAPS
100.0000 mg | ORAL_CAPSULE | Freq: Every day | ORAL | Status: DC
  Administered 2014-08-19: 100 mg via ORAL
  Filled 2014-08-19 (×2): qty 1

## 2014-08-19 MED ORDER — PRENATAL MULTIVITAMIN CH
1.0000 | ORAL_TABLET | Freq: Every day | ORAL | Status: DC
Start: 2014-08-19 — End: 2014-08-19
  Administered 2014-08-19: 1 via ORAL
  Filled 2014-08-19 (×2): qty 1

## 2014-08-19 MED ORDER — ZOLPIDEM TARTRATE 5 MG PO TABS: 5.0000 mg | ORAL_TABLET | Freq: Every evening | ORAL | Status: DC | PRN

## 2014-08-19 MED ORDER — PROMETHAZINE HCL 25 MG/ML IJ SOLN
12.5000 mg | INTRAMUSCULAR | Status: DC | PRN
  Administered 2014-08-19 (×3): 12.5 mg via INTRAVENOUS
  Filled 2014-08-19 (×3): qty 1

## 2014-08-19 NOTE — H&P (Signed)
See full NMW note below; Briefly this is Waters 34 y.o. G2P0010 335w4d with flank pain and frequency.  UA was neg for LE but had large blood.  No hx of kidney stone but may have one.  Admitted for fetal monitoring, antibiotics in case of early pyelo, fluids and pain control.  Urine is being strained.   Today: Filed Vitals:   08/19/14 0148 08/19/14 0424  BP: 131/75 133/78  Pulse: 97 90  Temp: 98.1 F (36.7 C) 98.1 F (36.7 C)  TempSrc: Oral Oral  Resp: 20 20  Height: 5\' 3"  (1.6 m)   Weight: 87.998 kg (194 lb)   SpO2:  100%    Urine culture P  Renal US- hydro on R but no stone seen; ureteral jets seen bilaterally  Jocelyn Waters      HPI This is Waters 34 y.o. female at 4635w4d who presents with c/o right flank pain and dysuria. Also had some hematuria today.   RN Note:  Expand All Collapse All  PT SAYS SHE STARTED HAVING PAIN IN RIGHT BACK SIDE ON Tuesday NIGHT - WENT AWAY- THEN TODAY VOID Waters LOT - THEN THIS AFTERNOON VOIDING BLOOD- AND PAIN IS SO BAD MAKES HER VOMIT- LAST VOMIT WAS AT 0030. TOOK TYLENOL AT 2300- SLIGHT RELIEF. NEVER HAD KIDNEY STONES.          OB History    Gravida Para Term Preterm AB TAB SAB Ectopic Multiple Living   2    1 1     0      Past Medical History  Diagnosis Date  . Reflux   . Bipolar disorder   . ADD (attention deficit disorder)   . ASCUS (atypical squamous cells of undetermined significance) on Pap smear 2007    Past Surgical History  Procedure Laterality Date  . Therapeutic abortion  05/2006  . Knee arthroscopy    . Intrauterine device insertion  05/04/2012    Mirena  . Iud removal  06/2013    Family History  Problem Relation Age of Onset  . Diabetes Maternal Aunt   . Hypertension Maternal Grandmother   . Hypertension Paternal Grandmother   . Diabetes Paternal Grandfather     History   Substance Use Topics  . Smoking status: Former Smoker -- 0.10 packs/day    Quit date: 09/01/2011  . Smokeless tobacco: Never Used  . Alcohol Use: No     Comment: SOCIALY    Allergies:  Allergies  Allergen Reactions  . Septra [Bactrim] Rash  . Sulfa Antibiotics Rash    Prescriptions prior to admission  Medication Sig Dispense Refill Last Dose  . Doxylamine-Pyridoxine (DICLEGIS) 10-10 MG TBEC Take 2 tablets by mouth Nightly. 60 tablet 1 Past Week at Unknown time  . ondansetron (ZOFRAN) 8 MG tablet Take by mouth every 8 (eight) hours as needed for nausea or vomiting.   Past Month at Unknown time  . Prenatal Multivit-Min-Fe-FA (PRENATAL VITAMINS PO) Take by mouth.   08/18/2014 at Unknown time    Review of Systems  Constitutional: Negative for fever, chills and malaise/fatigue.  Gastrointestinal: Positive for nausea and abdominal pain. Negative for vomiting, diarrhea and constipation.  Genitourinary: Positive for dysuria, hematuria and flank pain.  Neurological: Negative for headaches.   Physical Exam   Blood pressure 131/75, pulse 97, temperature 98.1 F (36.7 C), temperature source Oral, resp. rate 20, height 5\' 3"  (1.6 m), weight 194 lb (87.998 kg), last menstrual period 03/21/2014.  Physical Exam  Constitutional: She is oriented to person,  place, and time. She appears well-developed and well-nourished. No distress.  HENT:  Head: Normocephalic.  Cardiovascular: Normal rate and regular rhythm. Exam reveals no gallop and no friction rub.  No murmur heard. Respiratory: Effort normal and breath sounds normal. No respiratory distress. She has no wheezes. She has no rales.  GI: Soft. She exhibits no distension and no mass. There is tenderness (over right flank, + CVA tenderness). There is no rebound and no guarding.  Musculoskeletal: Normal range of motion.  Neurological: She is alert and oriented to person, place, and  time.  Skin: Skin is warm and dry.  Psychiatric: She has Waters normal mood and affect.    MAU Course  Procedures  MDM  Lab Results Last 24 Hours    Results for orders placed or performed during the hospital encounter of 08/19/14 (from the past 24 hour(s))  Urinalysis, Routine w reflex microscopic Status: Abnormal   Collection Time: 08/19/14 1:30 AM  Result Value Ref Range   Color, Urine YELLOW YELLOW   APPearance CLEAR CLEAR   Specific Gravity, Urine >1.030 (H) 1.005 - 1.030   pH 6.0 5.0 - 8.0   Glucose, UA NEGATIVE NEGATIVE mg/dL   Hgb urine dipstick LARGE (Waters) NEGATIVE   Bilirubin Urine NEGATIVE NEGATIVE   Ketones, ur NEGATIVE NEGATIVE mg/dL   Protein, ur 161 (Waters) NEGATIVE mg/dL   Urobilinogen, UA 0.2 0.0 - 1.0 mg/dL   Nitrite NEGATIVE NEGATIVE   Leukocytes, UA NEGATIVE NEGATIVE  Urine microscopic-add on Status: None   Collection Time: 08/19/14 1:30 AM  Result Value Ref Range   Squamous Epithelial / LPF RARE RARE   WBC, UA 0-2 <3 WBC/hpf   RBC / HPF 11-20 <3 RBC/hpf   Bacteria, UA RARE RARE  CBC with Differential Status: Abnormal   Collection Time: 08/19/14 2:40 AM  Result Value Ref Range   WBC 13.8 (H) 4.0 - 10.5 K/uL   RBC 3.40 (L) 3.87 - 5.11 MIL/uL   Hemoglobin 10.1 (L) 12.0 - 15.0 g/dL   HCT 09.6 (L) 04.5 - 40.9 %   MCV 87.9 78.0 - 100.0 fL   MCH 29.7 26.0 - 34.0 pg   MCHC 33.8 30.0 - 36.0 g/dL   RDW 81.1 91.4 - 78.2 %   Platelets 202 150 - 400 K/uL   Neutrophils Relative % 77 43 - 77 %   Neutro Abs 10.8 (H) 1.7 - 7.7 K/uL   Lymphocytes Relative 13 12 - 46 %   Lymphs Abs 1.7 0.7 - 4.0 K/uL   Monocytes Relative 9 3 - 12 %   Monocytes Absolute 1.2 (H) 0.1 - 1.0 K/uL   Eosinophils Relative 1 0 - 5 %   Eosinophils Absolute 0.1 0.0 - 0.7 K/uL   Basophils Relative 0 0 - 1 %   Basophils Absolute  0.0 0.0 - 0.1 K/uL       Assessment and Plan  Waters: [facility] at [redacted]w[redacted]d   Probable pyelonephritis  P: Admit   IV hydration  Ancef  Strain urine

## 2014-08-19 NOTE — Progress Notes (Signed)
Patient discharged home with family members.  Discharged instructions reviewed. Patient did not have any questions about discharge instructions. Prescription given to patient.  Discharged via wheelchair to car with J.Bass, NT.

## 2014-08-19 NOTE — MAU Note (Signed)
PT  SAYS SHE STARTED HAVING  PAIN IN  RIGHT  BACK  SIDE  ON Tuesday  NIGHT -  WENT  AWAY-  THEN  TODAY  VOID  A LOT  -  THEN THIS  AFTERNOON   VOIDING  BLOOD-  AND  PAIN IS  SO BAD  MAKES HER  VOMIT-  LAST VOMIT  WAS AT 0030.   TOOK TYLENOL   AT 2300-    SLIGHT  RELIEF.       NEVER  HAD  KIDNEY  STONES.

## 2014-08-19 NOTE — Plan of Care (Signed)
Problem: Phase II Progression Outcomes Goal: Electronic fetal monitoring(Doppler,Continuous,Intermittent) EFM (Doppler, Continuous, Intermittent)  Outcome: Completed/Met Date Met:  08/19/14 FHR Q8h

## 2014-08-19 NOTE — Discharge Summary (Signed)
Physician Discharge Summary  Patient ID: Jocelyn Waters MRN: [id number] DOB/AGE: [birth year] 34 y.o.  Admit date: 08/19/2014 Discharge date: 08/19/2014  Admission Diagnoses:kidney stone  Discharge Diagnoses: same   Discharged Condition: good  Hospital Course: Uncomplicated after fluids.  No signs of pyelo- culture pending.  Consults: None  Significant Diagnostic Studies: labs:  Results for orders placed or performed during the hospital encounter of 08/19/14 (from the past 24 hour(s))  Urinalysis, Routine w reflex microscopic     Status: Abnormal   Collection Time: 08/19/14  1:30 AM  Result Value Ref Range   Color, Urine YELLOW YELLOW   APPearance CLEAR CLEAR   Specific Gravity, Urine >1.030 (H) 1.005 - 1.030   pH 6.0 5.0 - 8.0   Glucose, UA NEGATIVE NEGATIVE mg/dL   Hgb urine dipstick LARGE (A) NEGATIVE   Bilirubin Urine NEGATIVE NEGATIVE   Ketones, ur NEGATIVE NEGATIVE mg/dL   Protein, ur 956100 (A) NEGATIVE mg/dL   Urobilinogen, UA 0.2 0.0 - 1.0 mg/dL   Nitrite NEGATIVE NEGATIVE   Leukocytes, UA NEGATIVE NEGATIVE  Urine microscopic-add on     Status: None   Collection Time: 08/19/14  1:30 AM  Result Value Ref Range   Squamous Epithelial / LPF RARE RARE   WBC, UA 0-2 <3 WBC/hpf   RBC / HPF 11-20 <3 RBC/hpf   Bacteria, UA RARE RARE  CBC with Differential     Status: Abnormal   Collection Time: 08/19/14  2:40 AM  Result Value Ref Range   WBC 13.8 (H) 4.0 - 10.5 K/uL   RBC 3.40 (L) 3.87 - 5.11 MIL/uL   Hemoglobin 10.1 (L) 12.0 - 15.0 g/dL   HCT 21.329.9 (L) 08.636.0 - 57.846.0 %   MCV 87.9 78.0 - 100.0 fL   MCH 29.7 26.0 - 34.0 pg   MCHC 33.8 30.0 - 36.0 g/dL   RDW 46.913.5 62.911.5 - 52.815.5 %   Platelets 202 150 - 400 K/uL   Neutrophils Relative % 77 43 - 77 %   Neutro Abs 10.8 (H) 1.7 - 7.7 K/uL   Lymphocytes Relative 13 12 - 46 %   Lymphs Abs 1.7 0.7 - 4.0 K/uL   Monocytes Relative 9 3 - 12 %   Monocytes Absolute 1.2 (H) 0.1 - 1.0 K/uL   Eosinophils Relative 1 0 - 5 %   Eosinophils  Absolute 0.1 0.0 - 0.7 K/uL   Basophils Relative 0 0 - 1 %   Basophils Absolute 0.0 0.0 - 0.1 K/uL    Treatments: IV hydration  Discharge Exam: Blood pressure 118/75, pulse 93, temperature 98.4 F (36.9 C), temperature source Oral, resp. rate 12, height 5\' 3"  (1.6 m), weight 87.998 kg (194 lb), last menstrual period 03/21/2014, SpO2 97 %.   Disposition: Final discharge disposition not confirmed- to home     Medication List    TAKE these medications        Doxylamine-Pyridoxine 10-10 MG Tbec  Commonly known as:  DICLEGIS  Take 2 tablets by mouth Nightly.     ondansetron 8 MG tablet  Commonly known as:  ZOFRAN  Take by mouth every 8 (eight) hours as needed for nausea or vomiting.     oxyCODONE-acetaminophen 5-325 MG per tablet  Commonly known as:  ROXICET  Take 1 tablet by mouth every 4 (four) hours as needed for severe pain.     PRENATAL VITAMINS PO  Take by mouth.     promethazine 12.5 MG tablet  Commonly known as:  PHENERGAN  Take 2 tablets (25 mg total) by mouth every 6 (six) hours as needed for nausea or vomiting.           Follow-up Information    Follow up with Almon Hercules., MD.   Specialty:  Obstetrics and Gynecology   Contact information:   [address] [place] [address] [phone]       Signed: Loney Laurence 08/19/2014, 7:23 PM

## 2014-08-19 NOTE — Progress Notes (Signed)
Pt feeling better and tolerating PO.  Pt desires to go home.  No real signs of pyelo- more likely passed kidney stone.  Will send home with phenergan and percocet.  F/u next week.

## 2014-08-19 NOTE — MAU Provider Note (Signed)
History     CSN: [id number]  Arrival date and time: 08/19/14 0133   None     No chief complaint on file.  HPI This is a 34 y.o. female at [redacted]w[redacted]d who presents with c/o right flank pain and dysuria. Also had some hematuria today.   RN Note:  Expand All Collapse All   PT SAYS SHE STARTED HAVING PAIN IN RIGHT BACK SIDE ON Tuesday NIGHT - WENT AWAY- THEN TODAY VOID A LOT - THEN THIS AFTERNOON VOIDING BLOOD- AND PAIN IS SO BAD MAKES HER VOMIT- LAST VOMIT WAS AT 0030. TOOK TYLENOL AT 2300- SLIGHT RELIEF. NEVER HAD KIDNEY STONES.          OB History    Gravida Para Term Preterm AB TAB SAB Ectopic Multiple Living   0      Past Medical History  Diagnosis Date  . Reflux   . Bipolar disorder   . ADD (attention deficit disorder)   . ASCUS (atypical squamous cells of undetermined significance) on Pap smear 2007    Past Surgical History  Procedure Laterality Date  . Therapeutic abortion  05/2006  . Knee arthroscopy    . Intrauterine device insertion  05/04/2012    Mirena  . Iud removal  06/2013    Family History  Problem Relation Age of Onset  . Diabetes Maternal Aunt   . Hypertension Maternal Grandmother   . Hypertension Paternal Grandmother   . Diabetes Paternal Grandfather     History  Substance Use Topics  . Smoking status: Former Smoker -- 0.10 packs/day    Quit date: 09/01/2011  . Smokeless tobacco: Never Used  . Alcohol Use: No     Comment: SOCIALY    Allergies:  Allergies  Allergen Reactions  . Septra [Bactrim] Rash  . Sulfa Antibiotics Rash    Prescriptions prior to admission  Medication Sig Dispense Refill Last Dose  . Doxylamine-Pyridoxine (DICLEGIS) 10-10 MG TBEC Take 2 tablets by mouth Nightly. 60 tablet 1 Past Week at Unknown time  . ondansetron (ZOFRAN) 8 MG tablet Take by mouth every 8 (eight) hours as needed for nausea or vomiting.   Past Month at Unknown time  . Prenatal  Multivit-Min-Fe-FA (PRENATAL VITAMINS PO) Take by mouth.   08/18/2014 at Unknown time    Review of Systems  Constitutional: Negative for fever, chills and malaise/fatigue.  Gastrointestinal: Positive for nausea and abdominal pain. Negative for vomiting, diarrhea and constipation.  Genitourinary: Positive for dysuria, hematuria and flank pain.  Neurological: Negative for headaches.   Physical Exam   Blood pressure 131/75, pulse 97, temperature 98.1 F (36.7 C), temperature source Oral, resp. rate 20, height  (1.6 m), weight 194 lb (87.998 kg), last menstrual period 03/21/2014.  Physical Exam  Constitutional: She is oriented to person, place, and time. She appears well-developed and well-nourished. No distress.  HENT:  Head: Normocephalic.  Cardiovascular: Normal rate and regular rhythm.  Exam reveals no gallop and no friction rub.   No murmur heard. Respiratory: Effort normal and breath sounds normal. No respiratory distress. She has no wheezes. She has no rales.  GI: Soft. She exhibits no distension and no mass. There is tenderness (over right flank, + CVA tenderness). There is no rebound and no guarding.  Musculoskeletal: Normal range of motion.  Neurological: She is alert and oriented to person, place, and time.  Skin: Skin is warm and dry.  Psychiatric: She has a normal mood and  affect.    MAU Course  Procedures  MDM Results for orders placed or performed during the hospital encounter of 08/19/14 (from the past 24 hour(s))  Urinalysis, Routine w reflex microscopic     Status: Abnormal   Collection Time: 08/19/14  1:30 AM  Result Value Ref Range   Color, Urine YELLOW YELLOW   APPearance CLEAR CLEAR   Specific Gravity, Urine >1.030 (H) 1.005 - 1.030   pH 6.0 5.0 - 8.0   Glucose, UA NEGATIVE NEGATIVE mg/dL   Hgb urine dipstick LARGE (A) NEGATIVE   Bilirubin Urine NEGATIVE NEGATIVE   Ketones, ur NEGATIVE NEGATIVE mg/dL   Protein, ur 161100 (A) NEGATIVE mg/dL    Urobilinogen, UA 0.2 0.0 - 1.0 mg/dL   Nitrite NEGATIVE NEGATIVE   Leukocytes, UA NEGATIVE NEGATIVE  Urine microscopic-add on     Status: None   Collection Time: 08/19/14  1:30 AM  Result Value Ref Range   Squamous Epithelial / LPF RARE RARE   WBC, UA 0-2 <3 WBC/hpf   RBC / HPF 11-20 <3 RBC/hpf   Bacteria, UA RARE RARE  CBC with Differential     Status: Abnormal   Collection Time: 08/19/14  2:40 AM  Result Value Ref Range   WBC 13.8 (H) 4.0 - 10.5 K/uL   RBC 3.40 (L) 3.87 - 5.11 MIL/uL   Hemoglobin 10.1 (L) 12.0 - 15.0 g/dL   HCT 09.629.9 (L) 04.536.0 - 40.946.0 %   MCV 87.9 78.0 - 100.0 fL   MCH 29.7 26.0 - 34.0 pg   MCHC 33.8 30.0 - 36.0 g/dL   RDW 81.113.5 91.411.5 - 78.215.5 %   Platelets 202 150 - 400 K/uL   Neutrophils Relative % 77 43 - 77 %   Neutro Abs 10.8 (H) 1.7 - 7.7 K/uL   Lymphocytes Relative 13 12 - 46 %   Lymphs Abs 1.7 0.7 - 4.0 K/uL   Monocytes Relative 9 3 - 12 %   Monocytes Absolute 1.2 (H) 0.1 - 1.0 K/uL   Eosinophils Relative 1 0 - 5 %   Eosinophils Absolute 0.1 0.0 - 0.7 K/uL   Basophils Relative 0 0 - 1 %   Basophils Absolute 0.0 0.0 - 0.1 K/uL     Assessment and Plan  A:  SIUP at 6275w4d       Probable pyelonephritis  P:  Admit       IV hydration      Ancef      Strain urine        Shalea Tomczak 08/19/2014, 2:02 AM

## 2014-08-20 LAB — CULTURE, OB URINE
Colony Count: 10000
Special Requests: NORMAL

## 2014-11-22 ENCOUNTER — Observation Stay (HOSPITAL_COMMUNITY)
Admission: AD | Admit: 2014-11-22 | Discharge: 2014-11-24 | Disposition: A | Discharge status: Home / Self Care | Payer: 59 | Source: Ambulatory Visit | Attending: Obstetrics and Gynecology | Admitting: Obstetrics and Gynecology

## 2014-11-22 ENCOUNTER — Other Ambulatory Visit: Payer: Self-pay | Admitting: Obstetrics and Gynecology

## 2014-11-22 ENCOUNTER — Encounter (HOSPITAL_COMMUNITY): Payer: Self-pay | Admitting: *Deleted

## 2014-11-22 DIAGNOSIS — Z3A35 35 weeks gestation of pregnancy: Secondary | ICD-10-CM | POA: Insufficient documentation

## 2014-11-22 DIAGNOSIS — O139 Gestational [pregnancy-induced] hypertension without significant proteinuria, unspecified trimester: Secondary | ICD-10-CM | POA: Diagnosis present

## 2014-11-22 DIAGNOSIS — Z8719 Personal history of other diseases of the digestive system: Secondary | ICD-10-CM | POA: Diagnosis not present

## 2014-11-22 DIAGNOSIS — Z8659 Personal history of other mental and behavioral disorders: Secondary | ICD-10-CM | POA: Insufficient documentation

## 2014-11-22 DIAGNOSIS — Z87891 Personal history of nicotine dependence: Secondary | ICD-10-CM | POA: Insufficient documentation

## 2014-11-22 DIAGNOSIS — O1493 Unspecified pre-eclampsia, third trimester: Secondary | ICD-10-CM

## 2014-11-22 DIAGNOSIS — O1490 Unspecified pre-eclampsia, unspecified trimester: Secondary | ICD-10-CM | POA: Diagnosis present

## 2014-11-22 HISTORY — DX: Gestational (pregnancy-induced) hypertension without significant proteinuria, unspecified trimester: O13.9

## 2014-11-22 LAB — URIC ACID: Uric Acid, Serum: 4.5 mg/dL (ref 2.4–7.0)

## 2014-11-22 LAB — CBC
HCT: 30.1 % — ABNORMAL LOW (ref 36.0–46.0)
Hemoglobin: 9.9 g/dL — ABNORMAL LOW (ref 12.0–15.0)
MCH: 27.5 pg (ref 26.0–34.0)
MCHC: 32.9 g/dL (ref 30.0–36.0)
MCV: 83.6 fL (ref 78.0–100.0)
Platelets: 180 K/uL (ref 150–400)
RBC: 3.6 MIL/uL — ABNORMAL LOW (ref 3.87–5.11)
RDW: 16.5 % — ABNORMAL HIGH (ref 11.5–15.5)
WBC: 9.4 K/uL (ref 4.0–10.5)

## 2014-11-22 LAB — URINALYSIS, ROUTINE W REFLEX MICROSCOPIC
Bilirubin Urine: NEGATIVE
Glucose, UA: NEGATIVE mg/dL
Ketones, ur: NEGATIVE mg/dL
Leukocytes, UA: NEGATIVE
Nitrite: NEGATIVE
Protein, ur: NEGATIVE mg/dL
Specific Gravity, Urine: 1.015 (ref 1.005–1.030)
Urobilinogen, UA: 0.2 mg/dL (ref 0.0–1.0)
pH: 6.5 (ref 5.0–8.0)

## 2014-11-22 LAB — COMPREHENSIVE METABOLIC PANEL
ALT: 23 U/L (ref 0–35)
AST: 31 U/L (ref 0–37)
Albumin: 3.1 g/dL — ABNORMAL LOW (ref 3.5–5.2)
Alkaline Phosphatase: 93 U/L (ref 39–117)
Anion gap: 9 (ref 5–15)
BUN: 8 mg/dL (ref 6–23)
CO2: 21 mmol/L (ref 19–32)
Calcium: 8.5 mg/dL (ref 8.4–10.5)
Chloride: 107 mmol/L (ref 96–112)
Creatinine, Ser: 0.47 mg/dL — ABNORMAL LOW (ref 0.50–1.10)
GFR calc Af Amer: >90 mL/min (ref >90)
GFR calc non Af Amer: >90 mL/min (ref >90)
Glucose, Bld: 77 mg/dL (ref 70–99)
Potassium: 3.5 mmol/L (ref 3.5–5.1)
Sodium: 137 mmol/L (ref 135–145)
Total Bilirubin: 0.1 mg/dL — ABNORMAL LOW (ref 0.3–1.2)
Total Protein: 6.6 g/dL (ref 6.0–8.3)

## 2014-11-22 LAB — URINE MICROSCOPIC-ADD ON

## 2014-11-22 LAB — OB RESULTS CONSOLE GBS: STREP GROUP B AG: NEGATIVE

## 2014-11-22 LAB — PROTEIN / CREATININE RATIO, URINE
Creatinine, Urine: 80 mg/dL
Protein Creatinine Ratio: 0.21 — ABNORMAL HIGH (ref 0.00–0.15)
Total Protein, Urine: 17 mg/dL

## 2014-11-22 LAB — LACTATE DEHYDROGENASE: LDH: 159 U/L (ref 94–250)

## 2014-11-22 MED ORDER — CALCIUM CARBONATE ANTACID 500 MG PO CHEW
2.0000 | CHEWABLE_TABLET | ORAL | Status: DC | PRN
Start: 2014-11-22 — End: 2014-11-24
  Administered 2014-11-23: 400 mg via ORAL
  Filled 2014-11-22: qty 1

## 2014-11-22 MED ORDER — FERROUS SULFATE 325 (65 FE) MG PO TABS
325.0000 mg | ORAL_TABLET | Freq: Two times a day (BID) | ORAL | Status: DC
  Administered 2014-11-22 – 2014-11-24 (×4): 325 mg via ORAL
  Filled 2014-11-22 (×4): qty 1

## 2014-11-22 MED ORDER — ACETAMINOPHEN 325 MG PO TABS
650.0000 mg | ORAL_TABLET | ORAL | Status: DC | PRN
  Administered 2014-11-22: 650 mg via ORAL
  Filled 2014-11-22: qty 2

## 2014-11-22 MED ORDER — ACETAMINOPHEN 500 MG PO TABS: 1000.0000 mg | ORAL_TABLET | Freq: Four times a day (QID) | ORAL | Status: DC | PRN

## 2014-11-22 MED ORDER — DOCUSATE SODIUM 100 MG PO CAPS
100.0000 mg | ORAL_CAPSULE | Freq: Every day | ORAL | Status: DC
  Administered 2014-11-23 – 2014-11-24 (×2): 100 mg via ORAL
  Filled 2014-11-22 (×2): qty 1

## 2014-11-22 MED ORDER — PRENATAL MULTIVITAMIN CH
1.0000 | ORAL_TABLET | Freq: Every day | ORAL | Status: DC
  Administered 2014-11-22 – 2014-11-23 (×2): 1 via ORAL
  Filled 2014-11-22 (×2): qty 1

## 2014-11-22 MED ORDER — ZOLPIDEM TARTRATE 5 MG PO TABS: 5.0000 mg | ORAL_TABLET | Freq: Every evening | ORAL | Status: DC | PRN

## 2014-11-22 NOTE — MAU Note (Signed)
Increased swelling in hands and feet over weekend, took BP - it was up.  Regular appt today; BP was up 140/80.  HA since Saturday- took Tylenol, didn't help;has had some intermittent blurring with vision;  Denies constant RUQ pain.

## 2014-11-22 NOTE — MAU Note (Signed)
Urine in lab 

## 2014-11-22 NOTE — H&P (Signed)
34 y.o. 8030w1d  G2P0010 comes in sent from office for elevated BP 140/80 and pitting edema to the knees with HA unresolved by tylenol.  Otherwise has good fetal movement and no bleeding.  She has no history of elevated BPs or other medical problems.  Upon seeing pt on antenatal she states her HA has improved to mild without receiving tylenol yet.  Past Medical History  Diagnosis Date  . Reflux   . Bipolar disorder   . ADD (attention deficit disorder)   . ASCUS (atypical squamous cells of undetermined significance) on Pap smear 2007  . Pregnancy induced hypertension     Past Surgical History  Procedure Laterality Date  . Therapeutic abortion  05/2006  . Knee arthroscopy    . Intrauterine device insertion  05/04/2012    Mirena  . Iud removal  06/2013    OB History  Gravida Para Term Preterm AB SAB TAB Ectopic Multiple Living  2    1  1    0    # Outcome Date GA Lbr Len/2nd Weight Sex Delivery Anes PTL Lv  2 Current           1 TAB               History   Social History  . Marital Status: Married    Spouse Name: N/A  . Number of Children: N/A  . Years of Education: N/A   Occupational History  . Not on file.   Social History Main Topics  . Smoking status: Former Smoker -- 0.10 packs/day    Quit date: 09/01/2011  . Smokeless tobacco: Never Used  . Alcohol Use: No     Comment: SOCIALY  . Drug Use: No  . Sexual Activity: Yes    Birth Control/ Protection: None   Other Topics Concern  . Not on file   Social History Narrative   Septra and Sulfa antibiotics    Filed Vitals:   11/22/14 1929  BP: 132/86  Pulse: 92  Temp: 97.5 F (36.4 C)  Resp: 20    Card: RRR Lungs:  CTAB Abdomen:  soft, gravid Ex:  no cords, erythema, + 2 pitting edema, brisk reflexes and +2 beats of clonus FHT: 140, mod var +accels no decels TOCO quiet SVE: deferred   A/P   Admit for 23 hr obs and 24 hour urine collection  Regular low sodium diet  24hr urine and repeat CBC, CMP in  am  Continue monitoring BPs - BPs initial systolic 160s, now persistent 161W/96E130s/80s (highest diastolic 91)  Tylenol 1000mg  for HA  Jocelyn Waters

## 2014-11-22 NOTE — MAU Note (Signed)
Pt was seen in the MD"s office for regular office visit and pt 's BP was elevated  And pt presented with some swelling in feet and legs and hands

## 2014-11-22 NOTE — MAU Provider Note (Signed)
History     CSN: [id number]  Arrival date and time: 11/22/14 1559   None     Chief Complaint  Patient presents with  . Hypertension   Hypertension This is a new problem. The current episode started today. The problem has been gradually worsening since onset. Associated symptoms include headaches and peripheral edema. Pertinent negatives include no blurred vision or shortness of breath.    34 y.o. G2P0010 @[redacted]w[redacted]d  presents from the office with elevated blood pressures. Pt states she has a headache but no visual disturbances. She has +1 pitting edema in LE's. Patellar reflexes are +3. Brisk. 2 beats clonus. Denies epigastric pain    Past Medical History  Diagnosis Date  . Reflux   . Bipolar disorder   . ADD (attention deficit disorder)   . ASCUS (atypical squamous cells of undetermined significance) on Pap smear 2007  . Pregnancy induced hypertension     Past Surgical History  Procedure Laterality Date  . Therapeutic abortion  05/2006  . Knee arthroscopy    . Intrauterine device insertion  05/04/2012    Mirena  . Iud removal  06/2013    Family History  Problem Relation Age of Onset  . Diabetes Maternal Aunt   . Hypertension Maternal Grandmother   . Hypertension Paternal Grandmother   . Diabetes Paternal Grandfather     History  Substance Use Topics  . Smoking status: Former Smoker -- 0.10 packs/day    Quit date: 09/01/2011  . Smokeless tobacco: Never Used  . Alcohol Use: No     Comment: SOCIALY    Allergies:  Allergies  Allergen Reactions  . Septra [Bactrim] Rash  . Sulfa Antibiotics Rash    Prescriptions prior to admission  Medication Sig Dispense Refill Last Dose  . acetaminophen (TYLENOL) 325 MG tablet Take 650 mg by mouth every 6 (six) hours as needed for mild pain or headache.   Past Week at Unknown time  . calcium carbonate (TUMS - DOSED IN MG ELEMENTAL CALCIUM) 500 MG chewable tablet Chew 1 tablet by mouth 3 (three) times daily as needed for  indigestion or heartburn.   Past Week at Unknown time  . cetirizine (ZYRTEC) 10 MG tablet Take 10 mg by mouth daily as needed for allergies.   Past Week at Unknown time  . ferrous sulfate 325 (65 FE) MG tablet Take 325 mg by mouth 2 (two) times daily with a meal.   11/22/2014 at Unknown time  . ondansetron (ZOFRAN) 8 MG tablet Take by mouth every 8 (eight) hours as needed for nausea or vomiting.   Past Month at Unknown time  . Prenatal Vit-Fe Fumarate-FA (PRENATAL MULTIVITAMIN) TABS tablet Take 1 tablet by mouth at bedtime.   11/21/2014 at Unknown time  . promethazine (PHENERGAN) 12.5 MG tablet Take 2 tablets (25 mg total) by mouth every 6 (six) hours as needed for nausea or vomiting. 30 tablet 4 Past Week at Unknown time  . ranitidine (ZANTAC) 150 MG tablet Take 150 mg by mouth 2 (two) times daily.   11/22/2014 at Unknown time  . Doxylamine-Pyridoxine (DICLEGIS) 10-10 MG TBEC Take 2 tablets by mouth Nightly. (Patient not taking: Reported on 11/22/2014) 60 tablet 1 Not Taking at Unknown time  . oxyCODONE-acetaminophen (ROXICET) 5-325 MG per tablet Take 1 tablet by mouth every 4 (four) hours as needed for severe pain. (Patient not taking: Reported on 11/22/2014) 30 tablet 0 Not Taking at Unknown time    Review of Systems  Eyes: Negative for blurred vision.  Respiratory: Negative for shortness of breath.   Neurological: Positive for headaches.   Physical Exam   Blood pressure 134/85, pulse 96, temperature 98.1 F (36.7 C), temperature source Oral, resp. rate 18, height 5' 2.25" (1.581 m), weight 100.699 kg (222 lb), last menstrual period 03/21/2014.  11/22/14 1751  --  96  --  --  134/85 mmHg  --  --  --  -- BB     11/22/14 1736  --  101  --  --  133/82 mmHg  --  --  --  -- BB    11/22/14 1721  --  92  --  --  130/83 mmHg  --  --  --  -- BB    11/22/14 1706  --  102  --  --  139/83 mmHg  --  --  --  -- BB    11/22/14 1659  --  100  --  --  139/91 mmHg  --  --  --  -- BB    11/22/14 1628  98.1  F (36.7 C)  97  --  18  160/90 mmHg             Results for orders placed or performed during the hospital encounter of 11/22/14 (from the past 24 hour(s))  Protein / creatinine ratio, urine     Status: Abnormal   Collection Time: 11/22/14  4:29 PM  Result Value Ref Range   Creatinine, Urine 80.00 mg/dL   Total Protein, Urine 17 mg/dL   Protein Creatinine Ratio 0.21 (H) 0.00 - 0.15  CBC     Status: Abnormal   Collection Time: 11/22/14  5:05 PM  Result Value Ref Range   WBC 9.4 4.0 - 10.5 K/uL   RBC 3.60 (L) 3.87 - 5.11 MIL/uL   Hemoglobin 9.9 (L) 12.0 - 15.0 g/dL   HCT 16.1 (L) 09.6 - 04.5 %   MCV 83.6 78.0 - 100.0 fL   MCH 27.5 26.0 - 34.0 pg   MCHC 32.9 30.0 - 36.0 g/dL   RDW 40.9 (H) 81.1 - 91.4 %   Platelets 180 150 - 400 K/uL  Comprehensive metabolic panel     Status: Abnormal   Collection Time: 11/22/14  5:05 PM  Result Value Ref Range   Sodium 137 135 - 145 mmol/L   Potassium 3.5 3.5 - 5.1 mmol/L   Chloride 107 96 - 112 mmol/L   CO2 21 19 - 32 mmol/L   Glucose, Bld 77 70 - 99 mg/dL   BUN 8 6 - 23 mg/dL   Creatinine, Ser 7.82 (L) 0.50 - 1.10 mg/dL   Calcium 8.5 8.4 - 95.6 mg/dL   Total Protein 6.6 6.0 - 8.3 g/dL   Albumin 3.1 (L) 3.5 - 5.2 g/dL   AST 31 0 - 37 U/L   ALT 23 0 - 35 U/L   Alkaline Phosphatase 93 39 - 117 U/L   Total Bilirubin 0.1 (L) 0.3 - 1.2 mg/dL   GFR calc non Af Amer >90 >90 mL/min   GFR calc Af Amer >90 >90 mL/min   Anion gap 9 5 - 15  Lactate dehydrogenase     Status: None   Collection Time: 11/22/14  5:05 PM  Result Value Ref Range   LDH 159 94 - 250 U/L  Uric acid     Status: None   Collection Time: 11/22/14  5:05 PM  Result Value Ref Range   Uric Acid, Serum 4.5 2.4 - 7.0 mg/dL  NST-  Reactive Cat 1   Physical Exam  Nursing note and vitals reviewed. Constitutional: She is oriented to person, place, and time. She appears well-developed and well-nourished. No distress.  HENT:  Head: Normocephalic and atraumatic.  Neck: Normal range  of motion.  Cardiovascular: Normal rate.   Respiratory: Effort normal. No respiratory distress.  GI: Soft. She exhibits no distension and no mass. There is no tenderness. There is no rebound and no guarding.  Musculoskeletal: Normal range of motion. She exhibits edema.  +1 pitting edema in ble's  Neurological: She is alert and oriented to person, place, and time. She displays abnormal reflex.  +3 Brisk  Skin: Skin is warm and dry.  Psychiatric: She has a normal mood and affect. Her behavior is normal. Judgment and thought content normal.    MAU Course  Procedures  MDM Cbc cmp Uric acid LDH Prot/creat Ratio Spoke with Dr Claiborne Billings; Pt will be admitted for 23 hours to antenatal unit to collect 24 hour urine   Assessment and Plan  Preeclampsia  Admit Antenatal unit   Jocelyn Waters 11/22/2014, 6:00 PM

## 2014-11-23 ENCOUNTER — Observation Stay (HOSPITAL_COMMUNITY): Payer: 59

## 2014-11-23 DIAGNOSIS — O139 Gestational [pregnancy-induced] hypertension without significant proteinuria, unspecified trimester: Secondary | ICD-10-CM | POA: Insufficient documentation

## 2014-11-23 DIAGNOSIS — Z3A35 35 weeks gestation of pregnancy: Secondary | ICD-10-CM | POA: Insufficient documentation

## 2014-11-23 LAB — COMPREHENSIVE METABOLIC PANEL
ALT: 20 U/L (ref 0–35)
AST: 26 U/L (ref 0–37)
Albumin: 2.8 g/dL — ABNORMAL LOW (ref 3.5–5.2)
Alkaline Phosphatase: 88 U/L (ref 39–117)
Anion gap: 9 (ref 5–15)
BUN: 7 mg/dL (ref 6–23)
CO2: 21 mmol/L (ref 19–32)
Calcium: 8.1 mg/dL — ABNORMAL LOW (ref 8.4–10.5)
Chloride: 106 mmol/L (ref 96–112)
Creatinine, Ser: 0.53 mg/dL (ref 0.50–1.10)
GFR calc Af Amer: >90 mL/min (ref >90)
GFR calc non Af Amer: >90 mL/min (ref >90)
Glucose, Bld: 109 mg/dL — ABNORMAL HIGH (ref 70–99)
Potassium: 3.5 mmol/L (ref 3.5–5.1)
Sodium: 136 mmol/L (ref 135–145)
Total Bilirubin: 0.4 mg/dL (ref 0.3–1.2)
Total Protein: 6 g/dL (ref 6.0–8.3)

## 2014-11-23 LAB — CBC
HCT: 28.3 % — ABNORMAL LOW (ref 36.0–46.0)
Hemoglobin: 9.1 g/dL — ABNORMAL LOW (ref 12.0–15.0)
MCH: 27.1 pg (ref 26.0–34.0)
MCHC: 32.2 g/dL (ref 30.0–36.0)
MCV: 84.2 fL (ref 78.0–100.0)
Platelets: 161 10*3/uL (ref 150–400)
RBC: 3.36 MIL/uL — ABNORMAL LOW (ref 3.87–5.11)
RDW: 16.7 % — ABNORMAL HIGH (ref 11.5–15.5)
WBC: 8.9 10*3/uL (ref 4.0–10.5)

## 2014-11-23 LAB — PROTEIN, URINE, 24 HOUR
Collection Interval-UPROT: 24 hours
Protein, 24H Urine: 295 mg/d — ABNORMAL HIGH (ref <150)
Protein, Urine: 10 mg/dL (ref 5–24)
Urine Total Volume-UPROT: 2950 mL

## 2014-11-23 MED ORDER — PROMETHAZINE HCL 25 MG PO TABS
12.5000 mg | ORAL_TABLET | Freq: Four times a day (QID) | ORAL | Status: DC | PRN
  Administered 2014-11-23: 12.5 mg via ORAL
  Filled 2014-11-23: qty 1

## 2014-11-23 MED ORDER — FAMOTIDINE 20 MG PO TABS
20.0000 mg | ORAL_TABLET | Freq: Once | ORAL | Status: AC
  Administered 2014-11-23: 20 mg via ORAL
  Filled 2014-11-23: qty 1

## 2014-11-23 MED ORDER — FAMOTIDINE 20 MG PO TABS
20.0000 mg | ORAL_TABLET | Freq: Two times a day (BID) | ORAL | Status: DC
  Administered 2014-11-23 – 2014-11-24 (×2): 20 mg via ORAL
  Filled 2014-11-23 (×2): qty 1

## 2014-11-23 MED ORDER — ACETAMINOPHEN 500 MG PO TABS
1000.0000 mg | ORAL_TABLET | Freq: Four times a day (QID) | ORAL | Status: DC | PRN
  Administered 2014-11-23 – 2014-11-24 (×2): 1000 mg via ORAL
  Filled 2014-11-23 (×2): qty 2

## 2014-11-23 MED ORDER — BETAMETHASONE SOD PHOS & ACET 6 (3-3) MG/ML IJ SUSP
12.0000 mg | INTRAMUSCULAR | Status: AC
  Administered 2014-11-23 – 2014-11-24 (×2): 12 mg via INTRAMUSCULAR
  Filled 2014-11-23 (×2): qty 2

## 2014-11-23 NOTE — Progress Notes (Signed)
Ur chart review completed.  

## 2014-11-23 NOTE — Consult Note (Signed)
Maternal Fetal Medicine Consultation  Requesting Provider(s): Carrington ClampMichelle Horvath, MD  Reason for consultation: Gestational hypertension vs. Preeclampsia without severe features  HPI: Jocelyn Waters is a 34 yo G2P0010, EDD 12/26/2014 who is currently at 35w 2d seen for consultation and recommendations for deliver due to gestational hypertension vs. Preeclampsia.  [name] reports that she has become more "swollen" over the past several weeks and had a 5# weight gain over the last week.  She was admitted for a 24-hr urine protein collection and observation.  Preeclampsia labs have been within normal limits.  Her urine protein/creatinine ratio was 0.21.  A 24-hr urine is currently being collected.  [name] reports some frontal headaches that have improved with rest and tylenol.  She denies RUQ pain.  Blood pressures since admission have been mostly in the 130-140/70-80 range.  On admission, she did have a 160/90 blood pressure.  She reports that her most recent clinic ultrasound showed a normally grown fetus (4# 15 oz).  Other that nausea and vomiting through most of her prenatal course, she denies any other prenatal complications.  OB History: OB History    Gravida Para Term Preterm AB TAB SAB Ectopic Multiple Living   2    1 1     0      PMH:  Past Medical History  Diagnosis Date  . Reflux   . Bipolar disorder   . ADD (attention deficit disorder)   . ASCUS (atypical squamous cells of undetermined significance) on Pap smear 2007  . Pregnancy induced hypertension     PSH:  Past Surgical History  Procedure Laterality Date  . Therapeutic abortion  05/2006  . Knee arthroscopy    . Intrauterine device insertion  05/04/2012    Mirena  . Iud removal  06/2013   Meds:  Scheduled Meds: . betamethasone acetate-betamethasone sodium phosphate  12 mg Intramuscular Q24 Hr x 2  . docusate sodium  100 mg Oral Daily  . ferrous sulfate  325 mg Oral BID WC  . prenatal multivitamin  1 tablet Oral  Q1200   Continuous Infusions:  PRN Meds:.acetaminophen, calcium carbonate, promethazine, zolpidem   Allergies:  Allergies  Allergen Reactions  . Septra [Bactrim] Rash  . Sulfa Antibiotics Rash   FH:  Family History  Problem Relation Age of Onset  . Diabetes Maternal Aunt   . Hypertension Maternal Grandmother   . Hypertension Paternal Grandmother   . Diabetes Paternal Grandfather    Soc:  History   Social History  . Marital Status: Married    Spouse Name: N/A  . Number of Children: N/A  . Years of Education: N/A   Occupational History  . Not on file.   Social History Main Topics  . Smoking status: Former Smoker -- 0.10 packs/day    Quit date: 09/01/2011  . Smokeless tobacco: Never Used  . Alcohol Use: No     Comment: SOCIALY  . Drug Use: No  . Sexual Activity: Yes    Birth Control/ Protection: None   Other Topics Concern  . Not on file   Social History Narrative    Review of Systems: no vaginal bleeding or cramping/contractions, no LOF, no nausea/vomiting. All other systems reviewed and are negative.  PE:   Filed Vitals:   11/23/14 1057  BP: 136/88  Pulse: 91  Temp: 97.8 F (36.6 C)  Resp: 18    GEN: well-appearing female ABD: gravid, NT  Please see separate document for fetal ultrasound report.  Labs: CBC  Component Value Date/Time   WBC 8.9 11/23/2014 0815   RBC 3.36* 11/23/2014 0815   HGB 9.1* 11/23/2014 0815   HCT 28.3* 11/23/2014 0815   PLT 161 11/23/2014 0815   MCV 84.2 11/23/2014 0815   MCH 27.1 11/23/2014 0815   MCHC 32.2 11/23/2014 0815   RDW 16.7* 11/23/2014 0815   LYMPHSABS 1.7 08/19/2014 0240   MONOABS 1.2* 08/19/2014 0240   EOSABS 0.1 08/19/2014 0240   BASOSABS 0.0 08/19/2014 0240   CMP     Component Value Date/Time   NA 136 11/23/2014 0815   K 3.5 11/23/2014 0815   CL 106 11/23/2014 0815   CO2 21 11/23/2014 0815   GLUCOSE 109* 11/23/2014 0815   BUN 7 11/23/2014 0815   CREATININE 0.53 11/23/2014 0815   CALCIUM  8.1* 11/23/2014 0815   PROT 6.0 11/23/2014 0815   ALBUMIN 2.8* 11/23/2014 0815   AST 26 11/23/2014 0815   ALT 20 11/23/2014 0815   ALKPHOS 88 11/23/2014 0815   BILITOT 0.4 11/23/2014 0815   GFRNONAA >90 11/23/2014 0815   GFRAA >90 11/23/2014 0815   A/P:  1) Single IUP at 35w 2d  2) Gestational hypertension vs. Preeclampsia without severe features - 24-hr urine protein currently pending.  Concur with decision to administer betamethasone based on new [facility] guidelines.  Based on laboratory work and blood pressures, do not anticipate that the patient will meet criteria for preeclampsia with severe features - if she remains stable, feel that she could be discharged home with close outpatient follow up once the results of the 24-hr urine protein return.  She will need 2x weekly NSTs with weekly AFIs and at least twice weekly blood pressure checks.  Would also recommend weekly preeclampsia labs as an outpatient. If otherwise stable, would recommend delivery at 37 weeks.  Would move toward delivery for severe features (severe range blood pressures SBD> 160 or DB >110; thrombocytopenia < 100K, LFTS > 2x normal, Creat > 1.2 mg/dl, severe headache not improved with medications).   Recommendations were discussed with the patient and her husband.  Thank you for the opportunity to be a part of the care of Jocelyn Waters. Please contact our office if we can be of further assistance.   I spent approximately 30 minutes with this patient with over 50% of time spent in face-to-face counseling.  Alpha Gula, MD Maternal Fetal Medicine

## 2014-11-23 NOTE — Progress Notes (Addendum)
34 y.o. G2P0010 [redacted]w[redacted]d HD# admitted for 35 WKS ELEVATED BP.  PIH.  Pt currently stable with no additional c/o.  HA was better last night but returned with nausea this am.  Good FM.  Swelling is slightly better.  Filed Vitals:   11/22/14 1929 11/22/14 2154 11/22/14 2341 11/23/14 0820  BP: 132/86 135/72 131/56 144/83  Pulse: 92 90 104 95  Temp: 97.5 F (36.4 C)   97.9 F (36.6 C)  TempSrc: Oral   Oral  Resp: 20   18  Height:      Weight:        Lungs CTA Cor RRR Abd  Soft, gravid, nontender Ex SCDs FHTs  Last night 130s, good short term variability, NST R Toco  occ  Results for orders placed or performed during the hospital encounter of 11/22/14 (from the past 24 hour(s))  Protein / creatinine ratio, urine     Status: Abnormal   Collection Time: 11/22/14  4:29 PM  Result Value Ref Range   Creatinine, Urine 80.00 mg/dL   Total Protein, Urine 17 mg/dL   Protein Creatinine Ratio 0.21 (H) 0.00 - 0.15  Urinalysis, Routine w reflex microscopic     Status: Abnormal   Collection Time: 11/22/14  4:29 PM  Result Value Ref Range   Color, Urine YELLOW YELLOW   APPearance CLEAR CLEAR   Specific Gravity, Urine 1.015 1.005 - 1.030   pH 6.5 5.0 - 8.0   Glucose, UA NEGATIVE NEGATIVE mg/dL   Hgb urine dipstick TRACE (A) NEGATIVE   Bilirubin Urine NEGATIVE NEGATIVE   Ketones, ur NEGATIVE NEGATIVE mg/dL   Protein, ur NEGATIVE NEGATIVE mg/dL   Urobilinogen, UA 0.2 0.0 - 1.0 mg/dL   Nitrite NEGATIVE NEGATIVE   Leukocytes, UA NEGATIVE NEGATIVE  Urine microscopic-add on     Status: Abnormal   Collection Time: 11/22/14  4:29 PM  Result Value Ref Range   Squamous Epithelial / LPF FEW (A) RARE   WBC, UA 0-2 <3 WBC/hpf   RBC / HPF 0-2 <3 RBC/hpf   Bacteria, UA MANY (A) RARE  CBC     Status: Abnormal   Collection Time: 11/22/14  5:05 PM  Result Value Ref Range   WBC 9.4 4.0 - 10.5 K/uL   RBC 3.60 (L) 3.87 - 5.11 MIL/uL   Hemoglobin 9.9 (L) 12.0 - 15.0 g/dL   HCT 40.9 (L) 81.1 - 91.4 %   MCV 83.6 78.0 - 100.0 fL   MCH 27.5 26.0 - 34.0 pg   MCHC 32.9 30.0 - 36.0 g/dL   RDW 78.2 (H) 95.6 - 21.3 %   Platelets 180 150 - 400 K/uL  Comprehensive metabolic panel     Status: Abnormal   Collection Time: 11/22/14  5:05 PM  Result Value Ref Range   Sodium 137 135 - 145 mmol/L   Potassium 3.5 3.5 - 5.1 mmol/L   Chloride 107 96 - 112 mmol/L   CO2 21 19 - 32 mmol/L   Glucose, Bld 77 70 - 99 mg/dL   BUN 8 6 - 23 mg/dL   Creatinine, Ser 0.86 (L) 0.50 - 1.10 mg/dL   Calcium 8.5 8.4 - 57.8 mg/dL   Total Protein 6.6 6.0 - 8.3 g/dL   Albumin 3.1 (L) 3.5 - 5.2 g/dL   AST 31 0 - 37 U/L   ALT 23 0 - 35 U/L   Alkaline Phosphatase 93 39 - 117 U/L   Total Bilirubin 0.1 (L) 0.3 - 1.2 mg/dL   GFR  calc non Af Amer >90 >90 mL/min   GFR calc Af Amer >90 >90 mL/min   Anion gap 9 5 - 15  Lactate dehydrogenase     Status: None   Collection Time: 11/22/14  5:05 PM  Result Value Ref Range   LDH 159 94 - 250 U/L  Uric acid     Status: None   Collection Time: 11/22/14  5:05 PM  Result Value Ref Range   Uric Acid, Serum 4.5 2.4 - 7.0 mg/dL    A:  HD#  3248w2d with PIH and some severe symptoms that are stable.  Laboratory values are normal.  BPs are mild range.  Will have MFM do consult [id number]:delivery planning.    P: Continue 24 hour urine collection for protein.  NST today.  MFM consult.  Tylenol for HA.  Since pt is <37 weeks and we may need to deliver early, course of BMZ is ordered.  Magon Croson A

## 2014-11-23 NOTE — Plan of Care (Signed)
Problem: Consults Goal: Birthing Suites Patient Information Press F2 to bring up selections list  Outcome: Not Applicable Date Met:  73/53/29  Pt < [redacted] weeks EGA

## 2014-11-24 NOTE — Discharge Instructions (Signed)
°Hypertension During Pregnancy °Hypertension is also called high blood pressure. Blood pressure moves blood in your body. Sometimes, the force that moves the blood becomes too strong. When you are pregnant, this condition should be watched carefully. It can cause problems for you and your baby. °HOME CARE  °· Make and keep all of your doctor visits. °· Take medicine as told by your doctor. Tell your doctor about all medicines you take. °· Eat very little salt. °· Exercise regularly. °· Do not drink alcohol. °· Do not smoke. °· Do not have drinks with caffeine. °· Lie on your left side when resting. °· Your health care provider may ask you to take one low-dose aspirin (81mg) each day. °GET HELP RIGHT AWAY IF: °· You have bad belly (abdominal) pain. °· You have sudden puffiness (swelling) in the hands, ankles, or face. °· You gain 4 pounds (1.8 kilograms) or more in 1 week. °· You throw up (vomit) repeatedly. °· You have bleeding from the vagina. °· You do not feel the baby moving as much. °· You have a headache. °· You have blurred or double vision. °· You have muscle twitching or spasms. °· You have shortness of breath. °· You have blue fingernails and lips. °· You have blood in your pee (urine). °MAKE SURE YOU: °· Understand these instructions. °· Will watch your condition. °· Will get help right away if you are not doing well or get worse. °Document Released: 08/24/2010 Document Revised: 12/06/2013 Document Reviewed: 02/18/2013 °ExitCare® Patient Information ©2015 [employer]. This information is not intended to replace advice given to you by your health care provider. Make sure you discuss any questions you have with your health care provider. ° ° °Preeclampsia and Eclampsia °Preeclampsia is a serious condition that develops only during pregnancy. It is also called toxemia of pregnancy. This condition causes high blood pressure along with other symptoms, such as swelling and headaches. These may develop as the  condition gets worse. Preeclampsia may occur 20 weeks or later into your pregnancy.  °Diagnosing and treating preeclampsia early is very important. If not treated early, it can cause serious problems for you and your baby. One problem it can lead to is eclampsia, which is a condition that causes muscle jerking or shaking (convulsions) in the mother. Delivering your baby is the best treatment for preeclampsia or eclampsia.  °RISK FACTORS °The cause of preeclampsia is not known. You may be more likely to develop preeclampsia if you have certain risk factors. These include:  °· Being pregnant for the first time. °· Having preeclampsia in a past pregnancy. °· Having a family history of preeclampsia. °· Having high blood pressure. °· Being pregnant with twins or triplets. °· Being 35 or older. °· Being African American. °· Having kidney disease or diabetes. °· Having medical conditions such as lupus or blood diseases. °· Being very overweight (obese). °SIGNS AND SYMPTOMS  °The earliest signs of preeclampsia are: °· High blood pressure. °· Increased protein in your urine. Your health care provider will check for this at every prenatal visit. °Other symptoms that can develop include:  °· Severe headaches. °· Sudden weight gain. °· Swelling of your hands, face, legs, and feet. °· Feeling sick to your stomach (nauseous) and throwing up (vomiting). °· Vision problems (blurred or double vision). °· Numbness in your face, arms, legs, and feet. °· Dizziness. °· Slurred speech. °· Sensitivity to bright lights. °· Abdominal pain. °DIAGNOSIS  °There are no screening tests for preeclampsia. Your health   provider will ask you about symptoms and check for signs of preeclampsia during your prenatal visits. You may also have tests, including:  Urine testing.  Blood testing.  Checking your baby's heart rate.  Checking the health of your baby and your placenta using images created with sound waves (ultrasound). TREATMENT    You can work out the best treatment approach together with your health care provider. It is very important to keep all prenatal appointments. If you have an increased risk of preeclampsia, you may need more frequent prenatal exams.  Your health care provider may prescribe bed rest.  You may have to eat as little salt as possible.  You may need to take medicine to lower your blood pressure if the condition does not respond to more conservative measures.  You may need to stay in the hospital if your condition is severe. There, treatment will focus on controlling your blood pressure and fluid retention. You may also need to take medicine to prevent seizures.  If the condition gets worse, your baby may need to be delivered early to protect you and the baby. You may have your labor started with medicine (be induced), or you may have a cesarean delivery.  Preeclampsia usually goes away after the baby is born. HOME CARE INSTRUCTIONS   Only take over-the-counter or prescription medicines as directed by your health care provider.  Lie on your left side while resting. This keeps pressure off your baby.  Elevate your feet while resting.  Get regular exercise. Ask your health care provider what type of exercise is safe for you.  Avoid caffeine and alcohol.  Do not smoke.  Drink 6-8 glasses of water every day.  Eat a balanced diet that is low in salt. Do not add salt to your food.  Avoid stressful situations as much as possible.  Get plenty of rest and sleep.  Keep all prenatal appointments and tests as scheduled. SEEK MEDICAL CARE IF:  You are gaining more weight than expected.  You have any headaches, abdominal pain, or nausea.  You are bruising more than usual.  You feel dizzy or light-headed. SEEK IMMEDIATE MEDICAL CARE IF:   You develop sudden or severe swelling anywhere in your body. This usually happens in the legs.  You gain 5 lb (2.3 kg) or more in a week.  You have a  severe headache, dizziness, problems with your vision, or confusion.  You have severe abdominal pain.  You have lasting nausea or vomiting.  You have a seizure.  You have trouble moving any part of your body.  You develop numbness in your body.  You have trouble speaking.  You have any abnormal bleeding.  You develop a stiff neck.  You pass out. MAKE SURE YOU:   Understand these instructions.  Will watch your condition.  Will get help right away if you are not doing well or get worse. Document Released: 07/19/2000 Document Revised: 07/27/2013 Document Reviewed: 05/14/2013 [facility] Patient Information 2015 [place], [place]. This information is not intended to replace advice given to you by your health care provider. Make sure you discuss any questions you have with your health care provider.  Early Elective Birth Early elective birth refers to making a choice to have a baby before the time the baby is due. The length of a pregnancy is 9 months, or 40 weeks, starting from the beginning of a woman's last menstrual period. Most women naturally go into labor around 40 weeks of gestation. A full-term pregnancy  is considered between 37 weeks and 42 weeks of gestation. Currently, early elective births can take place sometime after 39 weeks of gestation. Most health care providers practice within the guidelines of delivering a baby no later than 42 weeks of gestation and no earlier than 39 weeks of gestation. There are exceptions to this time interval, and the risks involved to the mother and baby need to be considered in those cases.  Induction of labor refers to the use of medicines to bring aboutcontractions. Labor is when the cervix starts to widen (dilate). Active labor is when there are contractions and the cervix has dilated to at least 4 cm. Oftentimes, the earlier a mother is in her pregnancy, the longer it takes to get induced. When the cervix is ready (dilated and soft), an induction  may take less than a day. However, when a cervix is far away from being ready (long, closed, and firm), it may take days in a hospital for labor to start.  Currently, 39 weeks of gestation is considered the earliest a health care providershould start the induction process. This is because the longer the baby stays inside the uterus, the lower the risks are to both the baby and mother. However, sometimes there are very good reasons for a pregnancy to be induced before 39 weeks of gestation. These exceptions are specific to each individual pregnancy and need to be considered on a case-by-case basis. A good reason to induce one pregnancy may not be a good reason for another pregnancy.  REASONS FOR ELECTIVE BIRTH It may be safer to induce labor before 39 weeks of gestation if:   A woman is carrying more than 1 baby. Current standards are to deliver twin pregnancies at 38 weeks of gestation.  A woman is having complications, such as:  High blood pressure caused by pregnancy (preeclampsia).  Bleeding.  Infection.  There are conditions affecting the baby's health, such as:  Intrauterine growth restriction (IUGR), where the baby is not growing well.  Having abnormal fetal heart rate patterns on the monitor (nonreassuring tracing).  Having a lack of fluid that surrounds the baby (oligohydramnios).  Having placental issues.  Fluid that surrounds the baby (amniotic fluid) is leaking. There are many other safety reasons that a pregnancy may need to be induced early. REASONS AGAINST ELECTIVE BIRTH Sometimes early elective birth is not the best choice. It may not be a good idea if:   An early birth is just more convenient.  You want the baby to be born on a certain date, like a holiday.  You are more likely to need a cesarean delivery before 39 weeks of gestation. A cesarean delivery can lead to other problems. Problems include infection, bleeding, and not having enough iron in your blood  (anemia), which can cause weakness.  Babies born early (34-37 weeks of gestation):  May need special care at the hospital or in a special care nursery.  Are at a greater risk for:  Brain damage.  Feeding problems.  Breathing problems.  Slow physical and mental development.  May need special care in a neonatal intensive care unit (NICU), but this is rare. The length of the baby's stay in the hospital will depend on how quickly he or she progresses to a safe level of care.  Are at a greater risk for:  Infection.  Bleeding inside the brain.  Dying during their first year of life. REDUCING EARLY ELECTIVE BIRTHS Carrying a baby longer than 42 weeks of gestation  is not good for the baby or the mother. A full-term pregnancy is best for baby and mother. Anything earlier can be risky for you and your baby. Remember:  An early elective birth may lead to a cesarean delivery. This can lead to other problems for the mother and baby.  An early elective birth can result in developmental problems for your child.  A baby's brain continues to develop while in the uterus.  A baby's body continues to develop. The baby will be better able to breathe and eat when he or she is born near the due date.  A baby who stays in the uterus longer responds better. The baby will also bond better with you. Document Released: 04/03/2011 Document Revised: 12/06/2013 Document Reviewed: 02/18/2013 [facility] Patient Information 2015 [place], [place]. This information is not intended to replace advice given to you by your health care provider. Make sure you discuss any questions you have with your health care provider.  Labor Induction  Labor induction is when steps are taken to cause a pregnant woman to begin the labor process. Most women go into labor on their own between 37 weeks and 42 weeks of the pregnancy. When this does not happen or when there is a medical need, methods may be used to induce labor. Labor  induction causes a pregnant woman's uterus to contract. It also causes the cervix to soften (ripen), open (dilate), and thin out (efface). Usually, labor is not induced before 39 weeks of the pregnancy unless there is a problem with the baby or mother.  Before inducing labor, your health care provider will consider a number of factors, including the following:  The medical condition of you and the baby.   How many weeks along you are.   The status of the baby's lung maturity.   The condition of the cervix.   The position of the baby.  WHAT ARE THE REASONS FOR LABOR INDUCTION? Labor may be induced for the following reasons:  The health of the baby or mother is at risk.   The pregnancy is overdue by 1 week or more.   The water breaks but labor does not start on its own.   The mother has a health condition or serious illness, such as high blood pressure, infection, placental abruption, or diabetes.  The amniotic fluid amounts are low around the baby.   The baby is distressed.  Convenience or wanting the baby to be born on a certain date is not a reason for inducing labor. WHAT METHODS ARE USED FOR LABOR INDUCTION? Several methods of labor induction may be used, such as:   Prostaglandin medicine. This medicine causes the cervix to dilate and ripen. The medicine will also start contractions. It can be taken by mouth or by inserting a suppository into the vagina.   Inserting a thin tube (catheter) with a balloon on the end into the vagina to dilate the cervix. Once inserted, the balloon is expanded with water, which causes the cervix to open.   Stripping the membranes. Your health care provider separates amniotic sac tissue from the cervix, causing the cervix to be stretched and causing the release of a hormone called progesterone. This may cause the uterus to contract. It is often done during an office visit. You will be sent home to wait for the contractions to begin. You  will then come in for an induction.   Breaking the water. Your health care provider makes a hole in the amniotic sac using  a small instrument. Once the amniotic sac breaks, contractions should begin. This may still take hours to see an effect.   Medicine to trigger or strengthen contractions. This medicine is given through an IV access tube inserted into a vein in your arm.  All of the methods of induction, besides stripping the membranes, will be done in the hospital. Induction is done in the hospital so that you and the baby can be carefully monitored.  HOW LONG DOES IT TAKE FOR LABOR TO BE INDUCED? Some inductions can take up to 2-3 days. Depending on the cervix, it usually takes less time. It takes longer when you are induced early in the pregnancy or if this is your first pregnancy. If a mother is still pregnant and the induction has been going on for 2-3 days, either the mother will be sent home or a cesarean delivery will be needed. WHAT ARE THE RISKS ASSOCIATED WITH LABOR INDUCTION? Some of the risks of induction include:   Changes in fetal heart rate, such as too high, too low, or erratic.   Fetal distress.   Chance of infection for the mother and baby.   Increased chance of having a cesarean delivery.   Breaking off (abruption) of the placenta from the uterus (rare).   Uterine rupture (very rare).  When induction is needed for medical reasons, the benefits of induction may outweigh the risks. WHAT ARE SOME REASONS FOR NOT INDUCING LABOR? Labor induction should not be done if:   It is shown that your baby does not tolerate labor.   You have had previous surgeries on your uterus, such as a myomectomy or the removal of fibroids.   Your placenta lies very low in the uterus and blocks the opening of the cervix (placenta previa).   Your baby is not in a head-down position.   The umbilical cord drops down into the birth canal in front of the baby. This could cut off  the baby's blood and oxygen supply.   You have had a previous cesarean delivery.   There are unusual circumstances, such as the baby being extremely premature.  Document Released: 12/11/2006 Document Revised: 03/24/2013 Document Reviewed: 02/18/2013 [facility] Patient Information 2015 [place], [place]. This information is not intended to replace advice given to you by your health care provider. Make sure you discuss any questions you have with your health care provider.

## 2014-11-24 NOTE — Discharge Summary (Signed)
Obstetric Discharge Summary Reason for Admission: Pregnancy Induced Hypertension Prenatal Procedures: NST and ultrasound Intrapartum Procedures: N/A, undelivered Postpartum Procedures: N/A, undelivered Complications-Operative and Postpartum: N/A, undelivered HEMOGLOBIN  Date Value Ref Range Status  11/23/2014 9.1* 12.0 - 15.0 g/dL Final   HCT  Date Value Ref Range Status  11/23/2014 28.3* 36.0 - 46.0 % Final    Physical Exam:  General: alert, cooperative and appears stated age NST reactive [place]: AFI 17cm  Results for orders placed or performed during the hospital encounter of 11/22/14 (from the past 72 hour(s))  Protein / creatinine ratio, urine     Status: Abnormal   Collection Time: 11/22/14  4:29 PM  Result Value Ref Range   Creatinine, Urine 80.00 mg/dL   Total Protein, Urine 17 mg/dL    Comment: NO NORMAL RANGE ESTABLISHED FOR THIS TEST   Protein Creatinine Ratio 0.21 (H) 0.00 - 0.15  Urinalysis, Routine w reflex microscopic     Status: Abnormal   Collection Time: 11/22/14  4:29 PM  Result Value Ref Range   Color, Urine YELLOW YELLOW   APPearance CLEAR CLEAR   Specific Gravity, Urine 1.015 1.005 - 1.030   pH 6.5 5.0 - 8.0   Glucose, UA NEGATIVE NEGATIVE mg/dL   Hgb urine dipstick TRACE (A) NEGATIVE   Bilirubin Urine NEGATIVE NEGATIVE   Ketones, ur NEGATIVE NEGATIVE mg/dL   Protein, ur NEGATIVE NEGATIVE mg/dL   Urobilinogen, UA 0.2 0.0 - 1.0 mg/dL   Nitrite NEGATIVE NEGATIVE   Leukocytes, UA NEGATIVE NEGATIVE  Urine microscopic-add on     Status: Abnormal   Collection Time: 11/22/14  4:29 PM  Result Value Ref Range   Squamous Epithelial / LPF FEW (A) RARE   WBC, UA 0-2 <3 WBC/hpf   RBC / HPF 0-2 <3 RBC/hpf   Bacteria, UA MANY (A) RARE  CBC     Status: Abnormal   Collection Time: 11/22/14  5:05 PM  Result Value Ref Range   WBC 9.4 4.0 - 10.5 K/uL   RBC 3.60 (L) 3.87 - 5.11 MIL/uL   Hemoglobin 9.9 (L) 12.0 - 15.0 g/dL   HCT 30.1 (L) 36.0 - 46.0 %   MCV 83.6  78.0 - 100.0 fL   MCH 27.5 26.0 - 34.0 pg   MCHC 32.9 30.0 - 36.0 g/dL   RDW 16.5 (H) 11.5 - 15.5 %   Platelets 180 150 - 400 K/uL  Comprehensive metabolic panel     Status: Abnormal   Collection Time: 11/22/14  5:05 PM  Result Value Ref Range   Sodium 137 135 - 145 mmol/L   Potassium 3.5 3.5 - 5.1 mmol/L   Chloride 107 96 - 112 mmol/L   CO2 21 19 - 32 mmol/L   Glucose, Bld 77 70 - 99 mg/dL   BUN 8 6 - 23 mg/dL   Creatinine, Ser 0.47 (L) 0.50 - 1.10 mg/dL   Calcium 8.5 8.4 - 10.5 mg/dL   Total Protein 6.6 6.0 - 8.3 g/dL   Albumin 3.1 (L) 3.5 - 5.2 g/dL   AST 31 0 - 37 U/L   ALT 23 0 - 35 U/L   Alkaline Phosphatase 93 39 - 117 U/L   Total Bilirubin 0.1 (L) 0.3 - 1.2 mg/dL   GFR calc non Af Amer >90 >90 mL/min   GFR calc Af Amer >90 >90 mL/min    Comment: (NOTE) The eGFR has been calculated using the CKD EPI equation. This calculation has not been validated in all clinical situations.  eGFR's persistently <90 mL/min signify possible Chronic Kidney Disease.    Anion gap 9 5 - 15  Lactate dehydrogenase     Status: None   Collection Time: 11/22/14  5:05 PM  Result Value Ref Range   LDH 159 94 - 250 U/L  Uric acid     Status: None   Collection Time: 11/22/14  5:05 PM  Result Value Ref Range   Uric Acid, Serum 4.5 2.4 - 7.0 mg/dL  Protein, urine, 24 hour     Status: Abnormal   Collection Time: 11/22/14  7:32 PM  Result Value Ref Range   Urine Total Volume-UPROT 2950 mL   Collection Interval-UPROT 24 hours   Protein, Urine 10 5 - 24 mg/dL   Protein, 24H Urine 295 (H) <150 mg/day    Comment: Performed at [facility]  Comprehensive metabolic panel     Status: Abnormal   Collection Time: 11/23/14  8:15 AM  Result Value Ref Range   Sodium 136 135 - 145 mmol/L   Potassium 3.5 3.5 - 5.1 mmol/L   Chloride 106 96 - 112 mmol/L   CO2 21 19 - 32 mmol/L   Glucose, Bld 109 (H) 70 - 99 mg/dL   BUN 7 6 - 23 mg/dL   Creatinine, Ser 0.53 0.50 - 1.10 mg/dL   Calcium 8.1 (L) 8.4  - 10.5 mg/dL   Total Protein 6.0 6.0 - 8.3 g/dL   Albumin 2.8 (L) 3.5 - 5.2 g/dL   AST 26 0 - 37 U/L   ALT 20 0 - 35 U/L   Alkaline Phosphatase 88 39 - 117 U/L   Total Bilirubin 0.4 0.3 - 1.2 mg/dL   GFR calc non Af Amer >90 >90 mL/min   GFR calc Af Amer >90 >90 mL/min    Comment: (NOTE) The eGFR has been calculated using the CKD EPI equation. This calculation has not been validated in all clinical situations. eGFR's persistently <90 mL/min signify possible Chronic Kidney Disease.    Anion gap 9 5 - 15  CBC     Status: Abnormal   Collection Time: 11/23/14  8:15 AM  Result Value Ref Range   WBC 8.9 4.0 - 10.5 K/uL   RBC 3.36 (L) 3.87 - 5.11 MIL/uL   Hemoglobin 9.1 (L) 12.0 - 15.0 g/dL   HCT 28.3 (L) 36.0 - 46.0 %   MCV 84.2 78.0 - 100.0 fL   MCH 27.1 26.0 - 34.0 pg   MCHC 32.2 30.0 - 36.0 g/dL   RDW 16.7 (H) 11.5 - 15.5 %   Platelets 161 150 - 400 K/uL     Discharge Diagnoses: Gestational Hypertension, undelivered, BMZ x 2, MFM Consult  Discharge Information: Date: 11/24/2014 Activity: pelvic rest Diet: routine Medications: PNV Condition: stable Instructions: refer to practice specific booklet Discharge to: home Follow-up Information    Follow up with [facility] Lars Masson, MD On 11/25/2014.   Specialty:  Obstetrics   Why:  For a BP check & NST   Contact information:   [address] [place] [address] [phone]       Marcial Pacas. 11/24/2014, 9:01 AM

## 2014-11-25 ENCOUNTER — Encounter (HOSPITAL_COMMUNITY): Payer: Self-pay | Admitting: *Deleted

## 2014-11-25 ENCOUNTER — Inpatient Hospital Stay (HOSPITAL_COMMUNITY)
Admission: AD | Admit: 2014-11-25 | Discharge: 2014-11-25 | Disposition: A | Discharge status: Home / Self Care | Payer: 59 | Source: Ambulatory Visit | Attending: Obstetrics & Gynecology | Admitting: Obstetrics & Gynecology

## 2014-11-25 DIAGNOSIS — O1403 Mild to moderate pre-eclampsia, third trimester: Secondary | ICD-10-CM | POA: Diagnosis not present

## 2014-11-25 DIAGNOSIS — O133 Gestational [pregnancy-induced] hypertension without significant proteinuria, third trimester: Secondary | ICD-10-CM | POA: Insufficient documentation

## 2014-11-25 DIAGNOSIS — Z3A35 35 weeks gestation of pregnancy: Secondary | ICD-10-CM | POA: Diagnosis not present

## 2014-11-25 DIAGNOSIS — O9989 Other specified diseases and conditions complicating pregnancy, childbirth and the puerperium: Secondary | ICD-10-CM | POA: Diagnosis present

## 2014-11-25 DIAGNOSIS — Z87891 Personal history of nicotine dependence: Secondary | ICD-10-CM | POA: Insufficient documentation

## 2014-11-25 HISTORY — DX: Unspecified abnormal cytological findings in specimens from vagina: R87.629

## 2014-11-25 NOTE — Discharge Instructions (Signed)

## 2014-11-25 NOTE — MAU Provider Note (Signed)
Chief Complaint: fetal monitoring    First Provider Initiated Contact with Patient 11/25/14 1754     SUBJECTIVE HPI: Jocelyn Waters is a 34 y.o. G2P0010 at [redacted]w[redacted]d by LMP who was sent from office for further fetal monitoring. She reports having had "dips" in fetal heart on NST at the office. She is being followed closely for GHTN/ evolving mild preeclampsia. 24 hr urine 3 days ago had  protein. PreE labs otherwise reassuring. AFI 16.9 2 days ago. She had a mild headache earlier today mostly relieved by acetaminophen. Denies visual disturbances or epigastric pain. Reports serial  blood pressures in the office were improved today. Reports good fetal movement, no vaginal bleeding, no contractions, no leakage of fluid. Pregnancy course otherwise uncomplicated.  Past Medical History  Diagnosis Date  . Reflux   . Bipolar disorder   . ADD (attention deficit disorder)   . ASCUS (atypical squamous cells of undetermined significance) on Pap smear 2007  . Pregnancy induced hypertension   . Vaginal Pap smear, abnormal    OB History  Gravida Para Term Preterm AB SAB TAB Ectopic Multiple Living  0    # Outcome Date GA Lbr Len/2nd Weight Sex Delivery Anes PTL Lv  2 Current           1 TAB              Past Surgical History  Procedure Laterality Date  . Therapeutic abortion  05/2006  . Knee arthroscopy    . Intrauterine device insertion  05/04/2012    Mirena  . Iud removal  06/2013   History   Social History  . Marital Status: Married    Spouse Name: N/A  . Number of Children: N/A  . Years of Education: N/A   Occupational History  . Not on file.   Social History Main Topics  . Smoking status: Former Smoker -- 0.10 packs/day    Quit date: 09/01/2011  . Smokeless tobacco: Never Used  . Alcohol Use: No     Comment: SOCIALY  . Drug Use: No  . Sexual Activity: Yes    Birth Control/ Protection: None   Other Topics Concern  . Not on file   Social History Narrative    No current facility-administered medications on file prior to encounter.   Current Outpatient Prescriptions on File Prior to Encounter  Medication Sig Dispense Refill  . calcium carbonate (TUMS - DOSED IN MG ELEMENTAL CALCIUM) 500 MG chewable tablet Chew 1 tablet by mouth 3 (three) times daily as needed for indigestion or heartburn.    . cetirizine (ZYRTEC) 10 MG tablet Take 10 mg by mouth daily as needed for allergies.    . ferrous sulfate 325 (65 FE) MG tablet Take 325 mg by mouth 2 (two) times daily with a meal.    . Prenatal Vit-Fe Fumarate-FA (PRENATAL MULTIVITAMIN) TABS tablet Take 1 tablet by mouth at bedtime.    . promethazine (PHENERGAN) 12.5 MG tablet Take 2 tablets (25 mg total) by mouth every 6 (six) hours as needed for nausea or vomiting. 30 tablet 4  . ranitidine (ZANTAC) 150 MG tablet Take 150 mg by mouth 2 (two) times daily.     Allergies  Allergen Reactions  . Septra [Bactrim] Rash  . Sulfa Antibiotics Rash    Review of Systems  Constitutional: Negative for fever.  Eyes: Negative for blurred vision, double vision and photophobia.  Respiratory: Negative for cough and shortness  of breath.   Cardiovascular: Negative for chest pain.  Gastrointestinal: Negative for nausea and vomiting.  Neurological: Negative for dizziness and headaches.    OBJECTIVE Blood pressure 135/76, pulse 79, last menstrual period 03/21/2014.  Filed Vitals:   11/25/14 1719  BP: 135/76  Pulse: 79   Filed Vitals:   11/25/14 1719 11/25/14 1903 11/25/14 1907  BP: 135/76 129/78 120/68  Pulse: 79 76 69   GENERAL: Well-developed, well-nourished female in no acute distress.  HEART: normal rate RESP: normal effort GI: Abdomen soft, non-tender. Positive bowel sounds 4. MS: Nontender, no edema NEURO: Alert and oriented  EFM  X 2 hr Baseline 135-140, moderate variability, accelerations to 160-170, no decelerations Toco: no UCs   LAB RESULTS No results found for this or any previous  visit (from the past 24 hour(s)).  IMAGING [facility]  11/23/2014   OBSTETRICAL ULTRASOUND: This exam was performed within a [facility]. The OB US report was generated in the AS system, and faxed to the ordering physician.   This report is available in the [facility] PACS. See the [facility] Obstetric US report via the Image Link.   MAU COURSE  ASSESSMENT No diagnosis found.  PLAN Discharge home in stable condition. Preeclampsia and fetal activity precautions.    Medication List    TAKE these medications        acetaminophen 500 MG tablet  Commonly known as:  TYLENOL  Take 1,000 mg by mouth every 6 (six) hours as needed for headache.     calcium carbonate 500 MG chewable tablet  Commonly known as:  TUMS - dosed in mg elemental calcium  Chew 1 tablet by mouth 3 (three) times daily as needed for indigestion or heartburn.     cetirizine 10 MG tablet  Commonly known as:  ZYRTEC  Take 10 mg by mouth daily as needed for allergies.     ferrous sulfate 325 (65 FE) MG tablet  Take 325 mg by mouth 2 (two) times daily with a meal.     prenatal multivitamin Tabs tablet  Take 1 tablet by mouth at bedtime.     promethazine 12.5 MG tablet  Commonly known as:  PHENERGAN  Take 2 tablets (25 mg total) by mouth every 6 (six) hours as needed for nausea or vomiting.     ranitidine 150 MG tablet  Commonly known as:  ZANTAC  Take 150 mg by mouth 2 (two) times daily.       Follow-up Information    [facility], Sanjuana MaeWALDA STACIA, MD.   Specialty:  Obstetrics and Gynecology   Why:  Keep your scheduled prenatal appointment   Contact information:   [address] [place] [address] [phone]       Follow up On 11/29/2014.          Danae Orleanseirdre C Giannie Soliday, CNM 11/25/2014  5:57 PM

## 2014-11-25 NOTE — MAU Note (Signed)
Pt presents to MAU for fetal monitoring. Pt was evaluated at the office today and had a NRNST. Sent over for prolonged monitoring.

## 2014-11-30 ENCOUNTER — Telehealth (HOSPITAL_COMMUNITY): Payer: Self-pay | Admitting: *Deleted

## 2014-11-30 ENCOUNTER — Encounter (HOSPITAL_COMMUNITY): Payer: Self-pay | Admitting: *Deleted

## 2014-11-30 NOTE — Telephone Encounter (Signed)
Preadmission screen  

## 2014-12-06 ENCOUNTER — Encounter (HOSPITAL_COMMUNITY): Payer: Self-pay

## 2014-12-06 ENCOUNTER — Inpatient Hospital Stay (HOSPITAL_COMMUNITY)
Admission: RE | Admit: 2014-12-06 | Discharge: 2014-12-11 | DRG: 775 | Disposition: A | Discharge status: Home / Self Care | Payer: 59 | Source: Ambulatory Visit | Attending: Obstetrics & Gynecology | Admitting: Obstetrics & Gynecology

## 2014-12-06 DIAGNOSIS — K219 Gastro-esophageal reflux disease without esophagitis: Secondary | ICD-10-CM | POA: Diagnosis present

## 2014-12-06 DIAGNOSIS — Z6839 Body mass index (BMI) 39.0-39.9, adult: Secondary | ICD-10-CM | POA: Diagnosis not present

## 2014-12-06 DIAGNOSIS — O99214 Obesity complicating childbirth: Secondary | ICD-10-CM | POA: Diagnosis present

## 2014-12-06 DIAGNOSIS — Z3A37 37 weeks gestation of pregnancy: Secondary | ICD-10-CM | POA: Diagnosis present

## 2014-12-06 DIAGNOSIS — F319 Bipolar disorder, unspecified: Secondary | ICD-10-CM | POA: Diagnosis present

## 2014-12-06 DIAGNOSIS — O9962 Diseases of the digestive system complicating childbirth: Secondary | ICD-10-CM | POA: Diagnosis present

## 2014-12-06 DIAGNOSIS — Z87891 Personal history of nicotine dependence: Secondary | ICD-10-CM

## 2014-12-06 DIAGNOSIS — Z3490 Encounter for supervision of normal pregnancy, unspecified, unspecified trimester: Secondary | ICD-10-CM

## 2014-12-06 DIAGNOSIS — O99344 Other mental disorders complicating childbirth: Secondary | ICD-10-CM | POA: Diagnosis present

## 2014-12-06 DIAGNOSIS — O139 Gestational [pregnancy-induced] hypertension without significant proteinuria, unspecified trimester: Secondary | ICD-10-CM | POA: Diagnosis present

## 2014-12-06 DIAGNOSIS — O133 Gestational [pregnancy-induced] hypertension without significant proteinuria, third trimester: Principal | ICD-10-CM | POA: Diagnosis present

## 2014-12-06 HISTORY — DX: Calculus of kidney: N20.0

## 2014-12-06 LAB — PROTEIN / CREATININE RATIO, URINE
Creatinine, Urine: 100 mg/dL
Protein Creatinine Ratio: 0.16 mg/mg{Cre} — ABNORMAL HIGH (ref 0.00–0.15)
Total Protein, Urine: 16 mg/dL

## 2014-12-06 LAB — COMPREHENSIVE METABOLIC PANEL
ALK PHOS: 113 U/L (ref 38–126)
ALT: 15 U/L (ref 14–54)
ANION GAP: 11 (ref 5–15)
AST: 23 U/L (ref 15–41)
Albumin: 3 g/dL — ABNORMAL LOW (ref 3.5–5.0)
BUN: 10 mg/dL (ref 6–20)
CO2: 19 mmol/L — ABNORMAL LOW (ref 22–32)
Calcium: 9 mg/dL (ref 8.9–10.3)
Chloride: 106 mmol/L (ref 101–111)
Creatinine, Ser: 0.54 mg/dL (ref 0.44–1.00)
GFR calc Af Amer: >60 mL/min (ref >60)
Glucose, Bld: 132 mg/dL — ABNORMAL HIGH (ref 70–99)
POTASSIUM: 3.8 mmol/L (ref 3.5–5.1)
SODIUM: 136 mmol/L (ref 135–145)
TOTAL PROTEIN: 6.2 g/dL — AB (ref 6.5–8.1)
Total Bilirubin: <0.1 mg/dL — ABNORMAL LOW (ref 0.3–1.2)

## 2014-12-06 LAB — CBC
HCT: 31.7 % — ABNORMAL LOW (ref 36.0–46.0)
Hemoglobin: 10.5 g/dL — ABNORMAL LOW (ref 12.0–15.0)
MCH: 27.6 pg (ref 26.0–34.0)
MCHC: 33.1 g/dL (ref 30.0–36.0)
MCV: 83.2 fL (ref 78.0–100.0)
Platelets: 194 10*3/uL (ref 150–400)
RBC: 3.81 MIL/uL — ABNORMAL LOW (ref 3.87–5.11)
RDW: 17.3 % — AB (ref 11.5–15.5)
WBC: 10.7 10*3/uL — AB (ref 4.0–10.5)

## 2014-12-06 LAB — TYPE AND SCREEN
ABO/RH(D): A POS
ANTIBODY SCREEN: NEGATIVE

## 2014-12-06 MED ORDER — OXYTOCIN BOLUS FROM INFUSION: 500.0000 mL | INTRAVENOUS | Status: DC

## 2014-12-06 MED ORDER — LACTATED RINGERS IV SOLN
500.0000 mL | INTRAVENOUS | Status: DC | PRN
  Administered 2014-12-08: 500 mL via INTRAVENOUS

## 2014-12-06 MED ORDER — ACETAMINOPHEN 325 MG PO TABS: 650.0000 mg | ORAL_TABLET | ORAL | Status: DC | PRN

## 2014-12-06 MED ORDER — FLEET ENEMA 7-19 GM/118ML RE ENEM: 1.0000 | ENEMA | RECTAL | Status: DC | PRN

## 2014-12-06 MED ORDER — CITRIC ACID-SODIUM CITRATE 334-500 MG/5ML PO SOLN: 30.0000 mL | ORAL | Status: DC | PRN

## 2014-12-06 MED ORDER — OXYCODONE-ACETAMINOPHEN 5-325 MG PO TABS: 2.0000 | ORAL_TABLET | ORAL | Status: DC | PRN

## 2014-12-06 MED ORDER — LACTATED RINGERS IV SOLN
INTRAVENOUS | Status: DC
  Administered 2014-12-06 – 2014-12-09 (×6): via INTRAVENOUS

## 2014-12-06 MED ORDER — OXYTOCIN 40 UNITS IN LACTATED RINGERS INFUSION - SIMPLE MED
62.5000 mL/h | INTRAVENOUS | Status: DC
  Administered 2014-12-09: 999 mL/h via INTRAVENOUS

## 2014-12-06 MED ORDER — ZOLPIDEM TARTRATE 5 MG PO TABS
5.0000 mg | ORAL_TABLET | Freq: Every evening | ORAL | Status: DC | PRN
  Administered 2014-12-06: 5 mg via ORAL
  Filled 2014-12-06: qty 1

## 2014-12-06 MED ORDER — OXYCODONE-ACETAMINOPHEN 5-325 MG PO TABS
1.0000 | ORAL_TABLET | ORAL | Status: DC | PRN
  Administered 2014-12-09: 1 via ORAL

## 2014-12-06 MED ORDER — LIDOCAINE HCL (PF) 1 % IJ SOLN
30.0000 mL | INTRAMUSCULAR | Status: DC | PRN
  Filled 2014-12-06: qty 30

## 2014-12-06 MED ORDER — TERBUTALINE SULFATE 1 MG/ML IJ SOLN: 0.2500 mg | Freq: Once | INTRAMUSCULAR | Status: AC | PRN

## 2014-12-06 MED ORDER — MISOPROSTOL 25 MCG QUARTER TABLET
25.0000 ug | ORAL_TABLET | ORAL | Status: DC | PRN
  Administered 2014-12-06 – 2014-12-07 (×4): 25 ug via VAGINAL
  Filled 2014-12-06 (×2): qty 0.25
  Filled 2014-12-06: qty 1
  Filled 2014-12-06 (×2): qty 0.25

## 2014-12-06 MED ORDER — ONDANSETRON HCL 4 MG/2ML IJ SOLN
4.0000 mg | Freq: Four times a day (QID) | INTRAMUSCULAR | Status: DC | PRN
  Administered 2014-12-07 – 2014-12-09 (×4): 4 mg via INTRAVENOUS
  Filled 2014-12-06 (×4): qty 2

## 2014-12-06 NOTE — H&P (Signed)
34 y.o. 3034w1d  G2P0010 comes in for induction at term for PIH.  Otherwise has good fetal movement and no bleeding.  Pt had had severe HA in past but has no HA or s/s of severe preeclampsia tonight.  Past Medical History  Diagnosis Date  . Reflux   . Bipolar disorder   . ADD (attention deficit disorder)   . ASCUS (atypical squamous cells of undetermined significance) on Pap smear 2007  . Pregnancy induced hypertension   . Vaginal Pap smear, abnormal   . Hx of varicella   . GERD (gastroesophageal reflux disease)     Past Surgical History  Procedure Laterality Date  . Therapeutic abortion  05/2006  . Knee arthroscopy    . Intrauterine device insertion  05/04/2012    Mirena  . Iud removal  06/2013    OB History  Gravida Para Term Preterm AB SAB TAB Ectopic Multiple Living  2    1  1    0    # Outcome Date GA Lbr Len/2nd Weight Sex Delivery Anes PTL Lv  2 Current           1 TAB               History   Social History  . Marital Status: Married    Spouse Name: N/A  . Number of Children: N/A  . Years of Education: N/A   Occupational History  . Not on file.   Social History Main Topics  . Smoking status: Former Smoker -- 0.10 packs/day    Quit date: 09/01/2011  . Smokeless tobacco: Never Used  . Alcohol Use: No     Comment: SOCIALY  . Drug Use: No  . Sexual Activity: Yes    Birth Control/ Protection: None   Other Topics Concern  . Not on file   Social History Narrative   Septra and Sulfa antibiotics    Prenatal Transfer Tool  Maternal Diabetes: No Genetic Screening: Normal Maternal Ultrasounds/Referrals: Normal Fetal Ultrasounds or other Referrals:  None Maternal Substance Abuse:  No Significant Maternal Medications:  None Significant Maternal Lab Results: Lab values include: Other: last set of PIH labs were normal.  Other PNC: uncomplicated.    Filed Vitals:   12/06/14 2010  BP: 137/92  Pulse: 112  Temp: 98.4 F (36.9 C)  TempSrc: Oral  Resp: 20   Height: 5\' 3"  (1.6 m)  Weight: 100.699 kg (222 lb)     Lungs/Cor:  NAD Abdomen:  soft, gravid Ex:  no cords, erythema SVE:  Cl/th/ high in office- cervical check here waiting for cytotec FHTs:  120s, good STV, NST R Toco:  qocc   A/P   At 37 weeks with PIH.  PIH labs.  Induction with cytotec.  GBS neg.  Jocelyn Waters A

## 2014-12-07 LAB — ABO/RH: ABO/RH(D): A POS

## 2014-12-07 LAB — RPR: RPR: NONREACTIVE

## 2014-12-07 MED ORDER — TERBUTALINE SULFATE 1 MG/ML IJ SOLN: 0.2500 mg | Freq: Once | INTRAMUSCULAR | Status: AC | PRN

## 2014-12-07 MED ORDER — FAMOTIDINE 20 MG PO TABS
20.0000 mg | ORAL_TABLET | Freq: Every day | ORAL | Status: DC
  Administered 2014-12-08 – 2014-12-11 (×4): 20 mg via ORAL
  Filled 2014-12-07 (×4): qty 1

## 2014-12-07 MED ORDER — OXYTOCIN 40 UNITS IN LACTATED RINGERS INFUSION - SIMPLE MED
1.0000 m[IU]/min | INTRAVENOUS | Status: DC
  Administered 2014-12-07: 2 m[IU]/min via INTRAVENOUS
  Filled 2014-12-07: qty 1000

## 2014-12-07 MED ORDER — BUTORPHANOL TARTRATE 1 MG/ML IJ SOLN
1.0000 mg | INTRAMUSCULAR | Status: DC | PRN
  Administered 2014-12-07 – 2014-12-08 (×4): 1 mg via INTRAVENOUS
  Filled 2014-12-07 (×4): qty 1

## 2014-12-07 NOTE — Progress Notes (Signed)
Pt feeling more uncomfortable, requesting IV pain medication FHT: 145 Cat 1 TOCO: q2-3 SVE: 1/70/-2 Attempted foley bulb placement, had to d/c d/t pt discomfort Will continue pitocin, reassess and may attempt again later if no further dilation

## 2014-12-07 NOTE — Progress Notes (Signed)
Pt doing well, resting napping FHT 130 Cat 1 TOCO q3-4 SVE 0.5/80/-2 Not able to place foley bulb at this time Will start pitocin 2x2 and reassess for possibly placement later this evening.

## 2014-12-07 NOTE — Progress Notes (Signed)
Pt comfortable, just showered, in good spirits. FHT 130 Cat 1 TOCO 2-4 SVE: deferred until RN places next cytotec if able Discussed induction process

## 2014-12-08 ENCOUNTER — Encounter (HOSPITAL_COMMUNITY): Payer: Self-pay

## 2014-12-08 ENCOUNTER — Inpatient Hospital Stay (HOSPITAL_COMMUNITY): Payer: 59 | Admitting: Anesthesiology

## 2014-12-08 LAB — CBC
HCT: 32.6 % — ABNORMAL LOW (ref 36.0–46.0)
Hemoglobin: 10.5 g/dL — ABNORMAL LOW (ref 12.0–15.0)
MCH: 27.2 pg (ref 26.0–34.0)
MCHC: 32.2 g/dL (ref 30.0–36.0)
MCV: 84.5 fL (ref 78.0–100.0)
Platelets: 157 10*3/uL (ref 150–400)
RBC: 3.86 MIL/uL — ABNORMAL LOW (ref 3.87–5.11)
RDW: 17.4 % — ABNORMAL HIGH (ref 11.5–15.5)
WBC: 9.6 10*3/uL (ref 4.0–10.5)

## 2014-12-08 MED ORDER — DIPHENHYDRAMINE HCL 50 MG/ML IJ SOLN: 12.5000 mg | INTRAMUSCULAR | Status: DC | PRN

## 2014-12-08 MED ORDER — FENTANYL 2.5 MCG/ML BUPIVACAINE 1/10 % EPIDURAL INFUSION (WH - ANES)
14.0000 mL/h | INTRAMUSCULAR | Status: DC | PRN
  Administered 2014-12-08 – 2014-12-09 (×4): 14 mL/h via EPIDURAL
  Filled 2014-12-08 (×3): qty 125

## 2014-12-08 MED ORDER — LIDOCAINE HCL (PF) 1 % IJ SOLN
INTRAMUSCULAR | Status: DC | PRN
  Administered 2014-12-08 (×2): 8 mL

## 2014-12-08 MED ORDER — EPHEDRINE 5 MG/ML INJ
10.0000 mg | INTRAVENOUS | Status: DC | PRN
  Filled 2014-12-08: qty 2

## 2014-12-08 MED ORDER — PHENYLEPHRINE 40 MCG/ML (10ML) SYRINGE FOR IV PUSH (FOR BLOOD PRESSURE SUPPORT)
80.0000 ug | PREFILLED_SYRINGE | INTRAVENOUS | Status: DC | PRN
  Filled 2014-12-08: qty 2
  Filled 2014-12-08: qty 20

## 2014-12-08 NOTE — Progress Notes (Signed)
Jocelyn Waters is a 34 y.o. G2P0010 at 5929w3d by LMP admitted for induction of labor due to Hypertension.  Subjective: Patient w/o complaints  Objective: BP 142/92 mmHg  Pulse 88  Temp(Src) 97.8 F (36.6 C) (Oral)  Resp 18  Ht 5\' 3"  (1.6 m)  Wt 100.699 kg (222 lb)  BMI 39.34 kg/m2  LMP 03/21/2014      FHT:  FHR: 140 bpm, variability: moderate,  accelerations:  Present,  decelerations:  Absent UC:   regular, every 2-3 minutes SVE:   Dilation: 1 (external os; closed internal os) Effacement (%): 70 Station: -2 Exam by:: Dr. Mora ApplPinn  Labs: Lab Results  Component Value Date   WBC 10.7* 12/06/2014   HGB 10.5* 12/06/2014   HCT 31.7* 12/06/2014   MCV 83.2 12/06/2014   PLT 194 12/06/2014    Assessment / Plan: Induction of labor due to gestational hypertension,  progressing well on pitocin Will let patient shower and eat, will stop pitocin.  After she eats and showers, will place foley bulb, then re-start pitocin.  Labor: early latent labor  Preeclampsia:  no signs or symptoms of toxicity Fetal Wellbeing:  Category I Pain Control:  Labor support without medications I/D:  n/a   Essie HartINN, Cherity Blickenstaff STACIA 12/08/2014, 8:22 AM

## 2014-12-08 NOTE — Anesthesia Preprocedure Evaluation (Signed)
Anesthesia Evaluation  Patient identified by MRN, date of birth, ID band Patient awake    Reviewed: Allergy & Precautions, H&P , NPO status , Patient's Chart, lab work & pertinent test results  Airway Mallampati: II  TM Distance: >3 FB Neck ROM: full    Dental no notable dental hx.    Pulmonary former smoker,    Pulmonary exam normal       Cardiovascular hypertension, Normal cardiovascular exam    Neuro/Psych negative neurological ROS     GI/Hepatic Neg liver ROS,   Endo/Other  Morbid obesity  Renal/GU      Musculoskeletal   Abdominal (+) + obese,   Peds  Hematology negative hematology ROS (+)   Anesthesia Other Findings   Reproductive/Obstetrics (+) Pregnancy                             Anesthesia Physical Anesthesia Plan  ASA: III  Anesthesia Plan: Epidural   Post-op Pain Management:    Induction:   Airway Management Planned:   Additional Equipment:   Intra-op Plan:   Post-operative Plan:   Informed Consent: I have reviewed the patients History and Physical, chart, labs and discussed the procedure including the risks, benefits and alternatives for the proposed anesthesia with the patient or authorized representative who has indicated his/her understanding and acceptance.     Plan Discussed with:   Anesthesia Plan Comments:         Anesthesia Quick Evaluation

## 2014-12-08 NOTE — Progress Notes (Signed)
Jocelyn Waters is a 34 y.o. G2P0010 at 2884w3d by LMP admitted for induction of labor due to Hypertension.  Subjective: Patient now comfortable s/p epidural  Objective: BP 119/75 mmHg  Pulse 91  Temp(Src) 97.6 F (36.4 C) (Oral)  Resp 20  Ht 5\' 3"  (1.6 m)  Wt 100.699 kg (222 lb)  BMI 39.34 kg/m2  SpO2 100%  LMP 03/21/2014      FHT:  FHR: 135 bpm, variability: moderate,  accelerations:  Present,  decelerations:  Absent UC:   regular, every 2 minutes SVE:   Dilation: 2 Effacement (%): 50 Station: -2 Exam by:: Dr. Mora ApplPinn  Labs: Lab Results  Component Value Date   WBC 9.6 12/08/2014   HGB 10.5* 12/08/2014   HCT 32.6* 12/08/2014   MCV 84.5 12/08/2014   PLT 157 12/08/2014    Assessment / Plan: Induction of labor due to gestational hypertension,  Patient still in latent labor Foley bulb removed and AROM performed  IUPC placed to be able to aggressively augment pitocin   Preeclampsia:  no signs or symptoms of toxicity Fetal Wellbeing:  Category I Pain Control:  Epidural I/D:  n/a Anticipated MOD:  NSVD  Jocelyn Waters 12/08/2014, 10:50 PM

## 2014-12-08 NOTE — Anesthesia Procedure Notes (Signed)
Epidural Patient location during procedure: OB Start time: 12/08/2014 9:43 PM End time: 12/08/2014 9:47 PM  Staffing Anesthesiologist: Leilani AbleHATCHETT, Louise Rawson Performed by: anesthesiologist   Preanesthetic Checklist Completed: patient identified, surgical consent, pre-op evaluation, timeout performed, IV checked, risks and benefits discussed and monitors and equipment checked  Epidural Patient position: sitting Prep: site prepped and draped and DuraPrep Patient monitoring: continuous pulse ox and blood pressure Approach: midline Location: L3-L4 Injection technique: LOR air  Needle:  Needle type: Tuohy  Needle gauge: 17 G Needle length: 9 cm and 9 Needle insertion depth: 6 cm Catheter type: closed end flexible Catheter size: 19 Gauge Catheter at skin depth: 11 cm Test dose: negative and Other  Assessment Sensory level: T9 Events: blood not aspirated, injection not painful, no injection resistance, negative IV test and no paresthesia  Additional Notes Reason for block:procedure for pain

## 2014-12-08 NOTE — Progress Notes (Signed)
Jocelyn Waters is a 34 y.o. G2P0010 at 2557w3d by LMP admitted for induction of labor due to Hypertension.  Subjective: Patient starting to feel more uncomfortable  Objective: BP 130/87 mmHg  Pulse 83  Temp(Src) 98 F (36.7 C) (Oral)  Resp 20  Ht 5\' 3"  (1.6 m)  Wt 100.699 kg (222 lb)  BMI 39.34 kg/m2  LMP 03/21/2014      FHT:  FHR: 125 bpm, variability: moderate,  accelerations:  Present,  decelerations:  Absent UC:   regular, every 2 minutes SVE:   Dilation: 1 Effacement (%): 70 Station: -2 Exam by:: Dr. Mora ApplPinn  Labs: Lab Results  Component Value Date   WBC 9.6 12/08/2014   HGB 10.5* 12/08/2014   HCT 32.6* 12/08/2014   MCV 84.5 12/08/2014   PLT 157 12/08/2014    Assessment / Plan: Induction of labor due to gestational hypertension. Patient has foley bulb in place on 10mU of pitocin  Labor: awaiting the FB to fall out until 2000 if not will remove and try to AROM Preeclampsia:  no signs or symptoms of toxicity Fetal Wellbeing:  Category I Pain Control:  IV stadol I/D:  n/a Anticipated MOD:  unsure SVD vs primary Cesarean delivery  Jocelyn Waters, Jocelyn Waters 12/08/2014, 4:55 PM

## 2014-12-09 ENCOUNTER — Encounter (HOSPITAL_COMMUNITY): Payer: Self-pay

## 2014-12-09 LAB — CBC
HCT: 31.5 % — ABNORMAL LOW (ref 36.0–46.0)
Hemoglobin: 10.3 g/dL — ABNORMAL LOW (ref 12.0–15.0)
MCH: 27.4 pg (ref 26.0–34.0)
MCHC: 32.7 g/dL (ref 30.0–36.0)
MCV: 83.8 fL (ref 78.0–100.0)
PLATELETS: 141 10*3/uL — AB (ref 150–400)
RBC: 3.76 MIL/uL — AB (ref 3.87–5.11)
RDW: 17.2 % — ABNORMAL HIGH (ref 11.5–15.5)
WBC: 15.4 10*3/uL — ABNORMAL HIGH (ref 4.0–10.5)

## 2014-12-09 MED ORDER — ACETAMINOPHEN 325 MG PO TABS: 650.0000 mg | ORAL_TABLET | ORAL | Status: DC | PRN

## 2014-12-09 MED ORDER — SIMETHICONE 80 MG PO CHEW: 80.0000 mg | CHEWABLE_TABLET | ORAL | Status: DC | PRN

## 2014-12-09 MED ORDER — OXYCODONE-ACETAMINOPHEN 5-325 MG PO TABS: 2.0000 | ORAL_TABLET | ORAL | Status: DC | PRN

## 2014-12-09 MED ORDER — MISOPROSTOL 200 MCG PO TABS
1000.0000 ug | ORAL_TABLET | Freq: Once | ORAL | Status: AC
  Administered 2014-12-09: 1000 ug via RECTAL

## 2014-12-09 MED ORDER — OXYCODONE-ACETAMINOPHEN 5-325 MG PO TABS
1.0000 | ORAL_TABLET | ORAL | Status: DC | PRN
  Administered 2014-12-09 – 2014-12-10 (×2): 1 via ORAL
  Filled 2014-12-09 (×3): qty 1

## 2014-12-09 MED ORDER — BENZOCAINE-MENTHOL 20-0.5 % EX AERO
1.0000 "application " | INHALATION_SPRAY | CUTANEOUS | Status: DC | PRN
  Administered 2014-12-09 – 2014-12-11 (×2): 1 via TOPICAL
  Filled 2014-12-09 (×3): qty 56

## 2014-12-09 MED ORDER — OXYTOCIN 40 UNITS IN LACTATED RINGERS INFUSION - SIMPLE MED: 62.5000 mL/h | INTRAVENOUS | Status: DC | PRN

## 2014-12-09 MED ORDER — ONDANSETRON HCL 4 MG PO TABS: 4.0000 mg | ORAL_TABLET | ORAL | Status: DC | PRN

## 2014-12-09 MED ORDER — LANOLIN HYDROUS EX OINT: TOPICAL_OINTMENT | CUTANEOUS | Status: DC | PRN

## 2014-12-09 MED ORDER — DIBUCAINE 1 % RE OINT
1.0000 "application " | TOPICAL_OINTMENT | RECTAL | Status: DC | PRN
  Administered 2014-12-09: 1 via RECTAL
  Filled 2014-12-09 (×2): qty 28

## 2014-12-09 MED ORDER — ZOLPIDEM TARTRATE 5 MG PO TABS: 5.0000 mg | ORAL_TABLET | Freq: Every evening | ORAL | Status: DC | PRN

## 2014-12-09 MED ORDER — TETANUS-DIPHTH-ACELL PERTUSSIS 5-2.5-18.5 LF-MCG/0.5 IM SUSP: 0.5000 mL | Freq: Once | INTRAMUSCULAR | Status: DC

## 2014-12-09 MED ORDER — SENNOSIDES-DOCUSATE SODIUM 8.6-50 MG PO TABS
2.0000 | ORAL_TABLET | ORAL | Status: DC
  Administered 2014-12-10: 2 via ORAL
  Filled 2014-12-09 (×2): qty 2

## 2014-12-09 MED ORDER — DIPHENHYDRAMINE HCL 25 MG PO CAPS: 25.0000 mg | ORAL_CAPSULE | Freq: Four times a day (QID) | ORAL | Status: DC | PRN

## 2014-12-09 MED ORDER — ONDANSETRON HCL 4 MG/2ML IJ SOLN: 4.0000 mg | INTRAMUSCULAR | Status: DC | PRN

## 2014-12-09 MED ORDER — MISOPROSTOL 200 MCG PO TABS
ORAL_TABLET | ORAL | Status: AC
  Filled 2014-12-09: qty 5

## 2014-12-09 MED ORDER — IBUPROFEN 600 MG PO TABS
600.0000 mg | ORAL_TABLET | Freq: Four times a day (QID) | ORAL | Status: DC
  Administered 2014-12-09 – 2014-12-11 (×8): 600 mg via ORAL
  Filled 2014-12-09 (×8): qty 1

## 2014-12-09 MED ORDER — WITCH HAZEL-GLYCERIN EX PADS: 1.0000 "application " | MEDICATED_PAD | CUTANEOUS | Status: DC | PRN

## 2014-12-09 MED ORDER — PRENATAL MULTIVITAMIN CH
1.0000 | ORAL_TABLET | Freq: Every day | ORAL | Status: DC
Start: 2014-12-09 — End: 2014-12-11
  Administered 2014-12-09 – 2014-12-10 (×2): 1 via ORAL
  Filled 2014-12-09 (×2): qty 1

## 2014-12-09 NOTE — Progress Notes (Signed)
Jocelyn Waters is a 34 y.o. G2P0010 at 5537w4d by LMP admitted for Induction for gestational hypertension  Subjective: Feels more pressure  Objective: BP 110/69 mmHg  Pulse 79  Temp(Src) 99 F (37.2 C) (Oral)  Resp 20  Ht 5\' 3"  (1.6 m)  Wt 100.699 kg (222 lb)  BMI 39.34 kg/m2  SpO2 100%  LMP 03/21/2014 I/O last 3 completed shifts: In: -  Out: 775 [Urine:525; Emesis/NG output:250]    FHT:  FHR: 130 bpm, variability: moderate,  accelerations:  Abscent,  decelerations:  Present early variables UC:   regular, every 2 minutes SVE:   Dilation: 10 Effacement (%): 100 Station: +1 Exam by:: Jocelyn GelinasG. Whitfield, RN  Labs: Lab Results  Component Value Date   WBC 9.6 12/08/2014   HGB 10.5* 12/08/2014   HCT 32.6* 12/08/2014   MCV 84.5 12/08/2014   PLT 157 12/08/2014    Assessment / Plan: Induction of labor due to gestational hypertension,  progressing well on pitocin  Labor: Progressing normally and Now in second stage of labor Preeclampsia:  no signs or symptoms of toxicity Fetal Wellbeing:  Category II Pain Control:  Epidural I/D:  n/a Anticipated MOD:  NSVD  Jocelyn Waters 12/09/2014, 7:35 AM

## 2014-12-09 NOTE — Progress Notes (Signed)
Pt refused to stay on left lateral, she states foley catheter is uncomfortable, turn back to low fowlers.

## 2014-12-09 NOTE — Addendum Note (Signed)
Addendum  created 12/09/14 1420 by Angela Adamana G Yarel Rushlow, CRNA   Modules edited: Charges VN, Notes Section   Notes Section:  File: [id number]

## 2014-12-09 NOTE — Lactation Note (Signed)
This note was copied from the chart of Girl Akili Durley. Lactation Consultation Note  Patient Name: Girl Alda Leangie Smick ZOXWR'UToday's Date: 12/09/2014 Reason for consult: Initial assessment Baby 7 hours of life. Mom reports that baby has nursed twice, didn't latch/nurse well first time after delivery, but then nursed better the second time for 30 minutes. Assisted mom to latch baby in football position to right breast. Mom able to hand express colostrum from both breasts. Baby sleepy at breast and took several attempts before baby interested in latching. Baby appears to have a tight labial frenulum. Demonstrated to mom how baby's tongue is restricted and has a small crease in tip when baby extends tongue slightly past lower gumline. Enc mom to discuss with pediatrician if mom continues to have nipple soreness/difficulty with maintaining a deep latch after her milk comes in.  Mom reports that she is not experiencing any pain while baby nursing, but baby is suckling very gently off-and-on with this feeding. Discussed with mom how to safely un-latch baby if baby slips to tip of her nipple, and enc re-latching more deeply.  Mom aware of OP/BFSG, community resources, and [facility]. Mom states that she attended BF classes just before birth of this baby. Enc mom to call out for assistance with latching/nursing as needed.   Maternal Data Has patient been taught Hand Expression?: Yes Does the patient have breastfeeding experience prior to this delivery?: No  Feeding Feeding Type: Breast Fed Length of feed:  (LC assessed first 10 minutes of BF. )  LATCH Score/Interventions Latch: Repeated attempts needed to sustain latch, nipple held in mouth throughout feeding, stimulation needed to elicit sucking reflex. Intervention(s): Adjust position;Assist with latch;Breast compression  Audible Swallowing: A few with stimulation Intervention(s): Skin to skin;Hand expression  Type of Nipple:  Everted at rest and after stimulation  Comfort (Breast/Nipple): Soft / non-tender     Hold (Positioning): Assistance needed to correctly position infant at breast and maintain latch. Intervention(s): Breastfeeding basics reviewed;Support Pillows;Position options;Skin to skin  LATCH Score: 7  Lactation Tools Discussed/Used     Consult Status Consult Status: Follow-up Date: 12/10/14 Follow-up type: In-patient    Geralynn OchsWILLIARD, Elouise Divelbiss 12/09/2014, 3:41 PM

## 2014-12-09 NOTE — Anesthesia Postprocedure Evaluation (Signed)
  Anesthesia Post-op Note  Patient: Jocelyn Waters  Procedure(s) Performed: * No procedures listed *  Patient Location: Mother/Baby  Anesthesia Type:Epidural  Level of Consciousness: awake and alert   Airway and Oxygen Therapy: Patient Spontanous Breathing  Post-op Pain: mild  Post-op Assessment: Post-op Vital signs reviewed, Patient's Cardiovascular Status Stable, Respiratory Function Stable, No signs of Nausea or vomiting, Pain level controlled, No headache, No residual numbness and No residual motor weakness  Post-op Vital Signs: Reviewed  Last Vitals:  Filed Vitals:   12/09/14 1158  BP: 131/82  Pulse: 106  Temp: 37 C  Resp: 18    Complications: No apparent anesthesia complications

## 2014-12-10 LAB — CBC
HCT: 28.2 % — ABNORMAL LOW (ref 36.0–46.0)
HEMOGLOBIN: 9.1 g/dL — AB (ref 12.0–15.0)
MCH: 27 pg (ref 26.0–34.0)
MCHC: 32.3 g/dL (ref 30.0–36.0)
MCV: 83.7 fL (ref 78.0–100.0)
Platelets: 138 10*3/uL — ABNORMAL LOW (ref 150–400)
RBC: 3.37 MIL/uL — ABNORMAL LOW (ref 3.87–5.11)
RDW: 17.2 % — ABNORMAL HIGH (ref 11.5–15.5)
WBC: 16 10*3/uL — ABNORMAL HIGH (ref 4.0–10.5)

## 2014-12-10 NOTE — Progress Notes (Signed)
PPD#1 Pt without c/o.  Mother and baby are doing well.  VSSAF IMP/ stable Plan/ routine care.

## 2014-12-10 NOTE — Progress Notes (Signed)
CLINICAL SOCIAL WORK MATERNAL/CHILD NOTE  Patient Details  Name: Girl Alena Blankenbeckler MRN: [id number] Date of Birth: 12/09/2013  Date: 12/10/2014  Clinical Social Worker Initiating Note: Aleta Manternach, LCSWDate/ Time Initiated: 12/09/14/    Child's Name:     Legal Guardian:  ([place])   Need for Interpreter: None   Date of Referral: 12/09/14   Reason for Referral: Other (Comment)   Referral Source: [facility]   Address: [address] [place], [place] [address]  Phone number:  [phone])   Household Members: Parents   Natural Supports (not living in the home): Extended Family, Immediate Family   Professional Supports:None   Employment: (Both parents employed)   Type of Work:     Education:     Printmaker   Other Resources:     Cultural/Religious Considerations Which May Impact Care: none reported  Strengths:   Home prepared for newborn;  Ability to meet basic needs Risk Factors/Current Problems:  (Mother has Hx of International aid/development worker and ADD)   Cognitive State: Alert , Able to Concentrate    Mood/Affect: Calm , Happy , Bright    CSW Assessment: Acknowledged order for social work consult to assess mother's hx of bipolar and ADD. Met with mother who was pleasant and receptive to social work. Parents are married and have no other dependents. Spouse and maternal grandmother were present during [facility] visit. Mother reports that she was diagnosed with bipolar years ago, and took medication for the bipolar and ADD. However, later the bipolar diagnoses was challenged and it was questionable whether it was an accurate diagnoses. Informed that the issue was never resolved and she has not been on any psychiatric medication for almost 2 years. She denies any current symptoms of depression or anxiety. No acute social concerns noted or reported at this time. Mother informed of  social work Fish farm manager.  CSW Plan/Description:    Patient/Family Education - PP Depression information and resources No current barriers to discharge No further intervention needed at this time.  Lashawnta Burgert J, LCSW 12/10/2014, 3:11 PM

## 2014-12-11 NOTE — Discharge Summary (Signed)
Obstetric Discharge Summary Reason for Admission: induction of labor Prenatal Procedures: NST and ultrasound Intrapartum Procedures: spontaneous vaginal delivery Postpartum Procedures: none Complications-Operative and Postpartum: none HEMOGLOBIN  Date Value Ref Range Status  12/10/2014 9.1* 12.0 - 15.0 g/dL Final   HCT  Date Value Ref Range Status  12/10/2014 28.2* 36.0 - 46.0 % Final    Physical Exam:  General: alert Lochia: appropriate Uterine Fundus: firm  Discharge Diagnoses: Preelampsia  Discharge Information: Date: 12/11/2014 Activity: pelvic rest Diet: routine Medications: PNV and Ibuprofen Condition: stable Instructions: refer to practice specific booklet Discharge to: home Follow-up Information    Follow up with Levi AlandANDERSON,MARK E, MD. Schedule an appointment as soon as possible for a visit in 1 month.   Specialty:  Obstetrics and Gynecology   Contact information:   [address] [place] [phone]       Newborn Data: Live born female  Birth Weight: 6 lb 8.1 oz (2950 g) APGAR: 7, 8  Home with mother.  ANDERSON,MARK E 12/11/2014, 8:25 AM

## 2014-12-11 NOTE — Progress Notes (Signed)
PPD#2 Pt without complaints.  VSSAF IMP/doing well Plan/ Discharge.

## 2014-12-11 NOTE — Lactation Note (Signed)
This note was copied from the chart of Jocelyn Waters. Lactation Consultation Note  Patient Name: Jocelyn Alda Leangie Cleere RUEAV'WToday's Date: 12/11/2014 Reason for consult: Follow-up assessment;Late preterm infant . Mom has been supplementing with Alimentum after nursing due to weight loss. Has DEBP setup in room- reports she has used once and obtained only a few drops. Reports baby has been latching well. Asleep in dad's arms at present. Has Medela pump at home Plan is to nurse baby, supplement with EBM or formula and then pump to promote milk supply. No questions at present, Reviewed BFSG and OP appointments as resources for support after DC. To call prn  Maternal Data Formula Feeding for Exclusion: No Has patient been taught Hand Expression?: Yes Does the patient have breastfeeding experience prior to this delivery?: No  Feeding Nipple Type: Regular Length of feed: 15 min  LATCH Score/Interventions                      Lactation Tools Discussed/Used Pump Review: Milk Storage Initiated by:: RN Date initiated:: 12/11/14   Consult Status Consult Status: Complete    Jocelyn Waters, Jocelyn Waters 12/11/2014, 10:02 AM

## 2015-01-10 ENCOUNTER — Other Ambulatory Visit: Payer: Self-pay | Admitting: Obstetrics & Gynecology

## 2015-01-11 LAB — CYTOLOGY - PAP

## 2016-01-12 ENCOUNTER — Other Ambulatory Visit: Payer: Self-pay | Admitting: Obstetrics & Gynecology

## 2016-01-16 LAB — CYTOLOGY - PAP

## 2016-01-22 ENCOUNTER — Encounter: Payer: Self-pay | Admitting: Genetic Counselor

## 2016-01-22 ENCOUNTER — Other Ambulatory Visit: Payer: 59

## 2016-01-24 ENCOUNTER — Telehealth: Payer: Self-pay | Admitting: Genetic Counselor

## 2016-01-24 NOTE — Telephone Encounter (Signed)
Jocelyn Waters was accidentally referred to cancer genetic counseling instead of prenatal.  Discussed prenatal [facility] counselors at [facility].  Gave her the phone number for Quinn PlowmanKaren Corneliussen at [phone] who can direct her on whether or not she needs a doc's referral or can self-refer.  Also gave her my direct phone number should she have any other questions or should the given phone number not work.

## 2016-01-31 ENCOUNTER — Other Ambulatory Visit: Payer: Self-pay | Admitting: Family Medicine

## 2016-01-31 DIAGNOSIS — R1011 Right upper quadrant pain: Secondary | ICD-10-CM

## 2016-02-01 ENCOUNTER — Encounter: Payer: 59 | Admitting: Genetic Counselor

## 2016-02-01 ENCOUNTER — Other Ambulatory Visit: Payer: 59

## 2016-02-09 ENCOUNTER — Ambulatory Visit
Admission: RE | Admit: 2016-02-09 | Discharge: 2016-02-09 | Disposition: A | Discharge status: Home / Self Care | Payer: 59 | Source: Ambulatory Visit | Attending: Family Medicine | Admitting: Family Medicine

## 2016-02-09 DIAGNOSIS — R1011 Right upper quadrant pain: Secondary | ICD-10-CM

## 2016-02-12 ENCOUNTER — Other Ambulatory Visit (HOSPITAL_COMMUNITY): Payer: Self-pay | Admitting: Family Medicine

## 2016-02-12 DIAGNOSIS — R1011 Right upper quadrant pain: Secondary | ICD-10-CM

## 2016-02-16 ENCOUNTER — Ambulatory Visit (HOSPITAL_COMMUNITY)
Admission: RE | Admit: 2016-02-16 | Discharge: 2016-02-16 | Disposition: A | Discharge status: Home / Self Care | Payer: Commercial Managed Care - HMO | Source: Ambulatory Visit | Attending: Family Medicine | Admitting: Family Medicine

## 2016-02-16 ENCOUNTER — Other Ambulatory Visit: Payer: Self-pay | Admitting: Physician Assistant

## 2016-02-16 DIAGNOSIS — M7989 Other specified soft tissue disorders: Secondary | ICD-10-CM

## 2016-02-16 DIAGNOSIS — R1011 Right upper quadrant pain: Secondary | ICD-10-CM | POA: Diagnosis present

## 2016-02-16 MED ORDER — TECHNETIUM TC 99M MEBROFENIN IV KIT
5.2000 | PACK | Freq: Once | INTRAVENOUS | Status: AC | PRN
  Administered 2016-02-16: 5 via INTRAVENOUS

## 2016-05-10 ENCOUNTER — Other Ambulatory Visit: Payer: Self-pay | Admitting: Obstetrics & Gynecology

## 2016-05-10 LAB — OB RESULTS CONSOLE GC/CHLAMYDIA
Chlamydia: NEGATIVE
Gonorrhea: NEGATIVE

## 2016-06-03 ENCOUNTER — Inpatient Hospital Stay (HOSPITAL_COMMUNITY)
Admission: AD | Admit: 2016-06-03 | Discharge: 2016-06-03 | Disposition: A | Discharge status: Home / Self Care | Payer: Commercial Managed Care - HMO | Source: Ambulatory Visit | Attending: Obstetrics and Gynecology | Admitting: Obstetrics and Gynecology

## 2016-06-03 ENCOUNTER — Encounter (HOSPITAL_COMMUNITY): Payer: Self-pay | Admitting: *Deleted

## 2016-06-03 DIAGNOSIS — Z87891 Personal history of nicotine dependence: Secondary | ICD-10-CM | POA: Insufficient documentation

## 2016-06-03 DIAGNOSIS — Z3A01 Less than 8 weeks gestation of pregnancy: Secondary | ICD-10-CM | POA: Diagnosis not present

## 2016-06-03 DIAGNOSIS — O210 Mild hyperemesis gravidarum: Secondary | ICD-10-CM | POA: Insufficient documentation

## 2016-06-03 LAB — URINALYSIS, ROUTINE W REFLEX MICROSCOPIC
Bilirubin Urine: NEGATIVE
Glucose, UA: NEGATIVE mg/dL
KETONES UR: NEGATIVE mg/dL
NITRITE: NEGATIVE
PROTEIN: NEGATIVE mg/dL
Specific Gravity, Urine: 1.025 (ref 1.005–1.030)
pH: 6 (ref 5.0–8.0)

## 2016-06-03 LAB — URINE MICROSCOPIC-ADD ON

## 2016-06-03 MED ORDER — PROMETHAZINE HCL 12.5 MG PO TABS: 25.0000 mg | ORAL_TABLET | Freq: Four times a day (QID) | ORAL | 4 refills | Status: DC | PRN

## 2016-06-03 MED ORDER — METOCLOPRAMIDE HCL 10 MG PO TABS: 10.0000 mg | ORAL_TABLET | Freq: Three times a day (TID) | ORAL | 1 refills | Status: DC

## 2016-06-03 MED ORDER — RANITIDINE HCL 150 MG PO TABS: 150.0000 mg | ORAL_TABLET | Freq: Two times a day (BID) | ORAL | 1 refills | Status: DC

## 2016-06-03 MED ORDER — M.V.I. ADULT IV INJ
INJECTION | Freq: Once | INTRAVENOUS | Status: AC
  Administered 2016-06-03: 11:00:00 via INTRAVENOUS
  Filled 2016-06-03: qty 1000

## 2016-06-03 NOTE — Discharge Instructions (Signed)
Hyperemesis Gravidarum  Hyperemesis gravidarum is a severe form of nausea and vomiting that happens during pregnancy. Hyperemesis is worse than morning sickness. It may cause you to have nausea or vomiting all day for many days. It may keep you from eating and drinking enough food and liquids. Hyperemesis usually occurs during the first half (the first 20 weeks) of pregnancy. It often goes away once a woman is in her second half of pregnancy. However, sometimes hyperemesis continues through an entire pregnancy.   CAUSES   The cause of this condition is not completely known but is thought to be related to changes in the body's hormones when pregnant. It could be from the high level of the pregnancy hormone or an increase in estrogen in the body.   SIGNS AND SYMPTOMS    Severe nausea and vomiting.   Nausea that does not go away.   Vomiting that does not allow you to keep any food down.   Weight loss and body fluid loss (dehydration).   Having no desire to eat or not liking food you have previously enjoyed.  DIAGNOSIS   Your health care provider will do a physical exam and ask you about your symptoms. He or she may also order blood tests and urine tests to make sure something else is not causing the problem.   TREATMENT   You may only need medicine to control the problem. If medicines do not control the nausea and vomiting, you will be treated in the hospital to prevent dehydration, increased acid in the blood (acidosis), weight loss, and changes in the electrolytes in your body that may harm the unborn baby (fetus). You may need IV fluids.   HOME CARE INSTRUCTIONS    Only take over-the-counter or prescription medicines as directed by your health care provider.   Try eating a couple of dry crackers or toast in the morning before getting out of bed.   Avoid foods and smells that upset your stomach.   Avoid fatty and spicy foods.   Eat 5-6 small meals a day.   Do not drink when eating meals. Drink between  meals.   For snacks, eat high-protein foods, such as cheese.   Eat or suck on things that have ginger in them. Ginger helps nausea.   Avoid food preparation. The smell of food can spoil your appetite.   Avoid iron pills and iron in your multivitamins until after 3-4 months of being pregnant. However, consult with your health care provider before stopping any prescribed iron pills.  SEEK MEDICAL CARE IF:    Your abdominal pain increases.   You have a severe headache.   You have vision problems.   You are losing weight.  SEEK IMMEDIATE MEDICAL CARE IF:    You are unable to keep fluids down.   You vomit blood.   You have constant nausea and vomiting.   You have excessive weakness.   You have extreme thirst.   You have dizziness or fainting.   You have a fever or persistent symptoms for more than 2-3 days.   You have a fever and your symptoms suddenly get worse.  MAKE SURE YOU:    Understand these instructions.   Will watch your condition.   Will get help right away if you are not doing well or get worse.     This information is not intended to replace advice given to you by your health care provider. Make sure you discuss any questions you have with   your health care provider.     Document Released: 07/22/2005 Document Revised: 05/12/2013 Document Reviewed: 03/03/2013  Elsevier Interactive Patient Education 2016 [employer]

## 2016-06-03 NOTE — MAU Note (Signed)
Has been been vomiting a lot since Tues. Was started on Phenergan on Thurs.  Now is not keeping down water or gatorade. Called office was told to come in . Has been weak and dizzy

## 2016-06-03 NOTE — MAU Provider Note (Signed)
History   Patient Jocelyn Waters is a 35 year old G3P1011 here with complaints of nauseu and vomiting. Patient has been vomiting since last Tuesday and her doctor put her on phenergen but she thinks it is not working. Felt very nauseas this weekend and threw up 6 times in the past 24 hours. Was able to keep her toast down this morning and her chicken last night.   CSN: [id number]  Arrival date and time: 06/03/16 [id number]   None     Chief Complaint  Patient presents with  . Emesis During Pregnancy  . Dizziness   Dizziness  Associated symptoms include vomiting. Nothing aggravates the symptoms. The treatment provided no relief.  Emesis   This is a recurrent problem. The current episode started in the past 7 days. The problem occurs 5 to 10 times per day. The problem has been unchanged. The emesis has an appearance of bile. There has been no fever. Associated symptoms include dizziness. The treatment provided no relief.    OB History    Gravida Para Term Preterm AB Living   3 1 1  0 1 1   SAB TAB Ectopic Multiple Live Births   0 1 0 0 1      Past Medical History:  Diagnosis Date  . ADD (attention deficit disorder)   . ASCUS (atypical squamous cells of undetermined significance) on Pap smear 2007  . Bipolar disorder (HCC)   . GERD (gastroesophageal reflux disease)    on Pepcid at home  . Hx of varicella   . Kidney stone    Passed stone January 2016  . Pregnancy induced hypertension    Gestational hypertension  . Reflux   . Vaginal Pap smear, abnormal     Past Surgical History:  Procedure Laterality Date  . INTRAUTERINE DEVICE INSERTION  05/04/2012   Mirena  . IUD REMOVAL  06/2013  . KNEE ARTHROSCOPY    . THERAPEUTIC ABORTION  05/2006    Family History  Problem Relation Age of Onset  . Diabetes Maternal Aunt   . Hypertension Maternal Grandmother   . Hypertension Paternal Grandmother   . Diabetes Paternal Grandfather     Social History  Substance Use Topics  .  Smoking status: Former Smoker    Packs/day: 0.10    Quit date: 09/01/2011  . Smokeless tobacco: Never Used  . Alcohol use No     Comment: SOCIALY    Allergies:  Allergies  Allergen Reactions  . Septra [Bactrim] Rash  . Sulfa Antibiotics Rash    Prescriptions Prior to Admission  Medication Sig Dispense Refill Last Dose  . calcium carbonate (TUMS - DOSED IN MG ELEMENTAL CALCIUM) 500 MG chewable tablet Chew 1 tablet by mouth 3 (three) times daily as needed for indigestion or heartburn.   12/05/2014 at Unknown time  . cetirizine (ZYRTEC) 10 MG tablet Take 10 mg by mouth daily as needed for allergies.   Past Week at Unknown time  . promethazine (PHENERGAN) 12.5 MG tablet Take 2 tablets (25 mg total) by mouth every 6 (six) hours as needed for nausea or vomiting. 30 tablet 4 12/05/2014 at Unknown time    Review of Systems  Constitutional: Negative.   HENT: Negative.   Eyes: Negative.   Respiratory: Negative.   Cardiovascular: Negative.   Gastrointestinal: Positive for vomiting.  Genitourinary: Negative.   Musculoskeletal: Negative.   Skin: Negative.   Neurological: Positive for dizziness.  Endo/Heme/Allergies: Negative.   Psychiatric/Behavioral: Negative.    Physical Exam  Blood pressure 127/77, pulse 97, temperature 97.5 F (36.4 C), temperature source Oral, resp. rate 18, height 5\' 3"  (1.6 m), weight 79.2 kg (174 lb 8 oz), last menstrual period 01/27/2016, SpO2 99 %, currently breastfeeding.  Physical Exam  Constitutional: She is oriented to person, place, and time. She appears well-developed.  Neck: Normal range of motion.  Cardiovascular: Normal rate and regular rhythm.   GI: Soft. Bowel sounds are normal.  Neurological: She is alert and oriented to person, place, and time.  Skin: Skin is warm and dry.    MAU Course  Procedures  MDM -UA -IV fluids with vitamins.   Assessment and Plan  Spoke with Dr. Claiborne Billingsallahan, patient to receive IV fluids with multivitamins here in  MAU and then discharge home with prescriptions for Zantac and reglan.  Patient now feels better and wants to go home; she had some crackers which she kept down. Does not feel dizzy anymore. She will keep her appointment with her regular OB-GYN.   Charlesetta GaribaldiKathryn Lorraine Kooistra CNM 06/03/2016, 9:08 AM

## 2016-06-17 LAB — OB RESULTS CONSOLE RPR: RPR: NONREACTIVE

## 2016-06-17 LAB — OB RESULTS CONSOLE ABO/RH: RH TYPE: POSITIVE

## 2016-06-17 LAB — OB RESULTS CONSOLE RUBELLA ANTIBODY, IGM: RUBELLA: IMMUNE

## 2016-06-17 LAB — OB RESULTS CONSOLE ANTIBODY SCREEN: Antibody Screen: NEGATIVE

## 2016-06-17 LAB — OB RESULTS CONSOLE HIV ANTIBODY (ROUTINE TESTING): HIV: NONREACTIVE

## 2016-06-17 LAB — OB RESULTS CONSOLE HEPATITIS B SURFACE ANTIGEN: Hepatitis B Surface Ag: NEGATIVE

## 2016-07-01 ENCOUNTER — Inpatient Hospital Stay (HOSPITAL_COMMUNITY)
Admission: AD | Admit: 2016-07-01 | Discharge: 2016-07-01 | Disposition: A | Discharge status: Home / Self Care | Payer: Commercial Managed Care - HMO | Source: Ambulatory Visit | Attending: Obstetrics | Admitting: Obstetrics

## 2016-07-01 ENCOUNTER — Encounter (HOSPITAL_COMMUNITY): Payer: Self-pay | Admitting: *Deleted

## 2016-07-01 DIAGNOSIS — O26892 Other specified pregnancy related conditions, second trimester: Secondary | ICD-10-CM | POA: Diagnosis present

## 2016-07-01 DIAGNOSIS — O99612 Diseases of the digestive system complicating pregnancy, second trimester: Secondary | ICD-10-CM | POA: Diagnosis not present

## 2016-07-01 DIAGNOSIS — K2970 Gastritis, unspecified, without bleeding: Secondary | ICD-10-CM | POA: Diagnosis not present

## 2016-07-01 DIAGNOSIS — A084 Viral intestinal infection, unspecified: Secondary | ICD-10-CM | POA: Insufficient documentation

## 2016-07-01 DIAGNOSIS — Z87891 Personal history of nicotine dependence: Secondary | ICD-10-CM | POA: Diagnosis not present

## 2016-07-01 DIAGNOSIS — Z79899 Other long term (current) drug therapy: Secondary | ICD-10-CM | POA: Insufficient documentation

## 2016-07-01 DIAGNOSIS — O99342 Other mental disorders complicating pregnancy, second trimester: Secondary | ICD-10-CM | POA: Diagnosis present

## 2016-07-01 DIAGNOSIS — Z3A14 14 weeks gestation of pregnancy: Secondary | ICD-10-CM | POA: Insufficient documentation

## 2016-07-01 LAB — URINALYSIS, ROUTINE W REFLEX MICROSCOPIC
Bilirubin Urine: NEGATIVE
Glucose, UA: 500 mg/dL — AB
Ketones, ur: >80 mg/dL — AB
LEUKOCYTES UA: NEGATIVE
Nitrite: NEGATIVE
PROTEIN: NEGATIVE mg/dL
Specific Gravity, Urine: 1.02 (ref 1.005–1.030)
pH: 6.5 (ref 5.0–8.0)

## 2016-07-01 LAB — COMPREHENSIVE METABOLIC PANEL
ALBUMIN: 3.7 g/dL (ref 3.5–5.0)
ALT: 14 U/L (ref 14–54)
AST: 20 U/L (ref 15–41)
Alkaline Phosphatase: 65 U/L (ref 38–126)
Anion gap: 13 (ref 5–15)
BILIRUBIN TOTAL: 0.4 mg/dL (ref 0.3–1.2)
BUN: 11 mg/dL (ref 6–20)
CO2: 20 mmol/L — ABNORMAL LOW (ref 22–32)
CREATININE: 0.48 mg/dL (ref 0.44–1.00)
Calcium: 9.1 mg/dL (ref 8.9–10.3)
Chloride: 103 mmol/L (ref 101–111)
GFR calc Af Amer: >60 mL/min (ref >60)
GLUCOSE: 97 mg/dL (ref 65–99)
Potassium: 3.5 mmol/L (ref 3.5–5.1)
Sodium: 136 mmol/L (ref 135–145)
Total Protein: 7.4 g/dL (ref 6.5–8.1)

## 2016-07-01 LAB — CBC WITH DIFFERENTIAL/PLATELET
BASOS ABS: 0 10*3/uL (ref 0.0–0.1)
Basophils Relative: 0 %
Eosinophils Absolute: 0 10*3/uL (ref 0.0–0.7)
Eosinophils Relative: 0 %
HEMATOCRIT: 37.4 % (ref 36.0–46.0)
Hemoglobin: 13 g/dL (ref 12.0–15.0)
LYMPHS PCT: 10 %
Lymphs Abs: 1.4 10*3/uL (ref 0.7–4.0)
MCH: 29.3 pg (ref 26.0–34.0)
MCHC: 34.8 g/dL (ref 30.0–36.0)
MCV: 84.2 fL (ref 78.0–100.0)
Monocytes Absolute: 0.4 10*3/uL (ref 0.1–1.0)
Monocytes Relative: 3 %
NEUTROS ABS: 12.3 10*3/uL — AB (ref 1.7–7.7)
Neutrophils Relative %: 87 %
Platelets: 225 10*3/uL (ref 150–400)
RBC: 4.44 MIL/uL (ref 3.87–5.11)
RDW: 14.1 % (ref 11.5–15.5)
WBC: 14 10*3/uL — AB (ref 4.0–10.5)

## 2016-07-01 LAB — URINE MICROSCOPIC-ADD ON

## 2016-07-01 MED ORDER — PROMETHAZINE HCL 25 MG/ML IJ SOLN
25.0000 mg | Freq: Once | INTRAMUSCULAR | Status: AC
  Administered 2016-07-01: 25 mg via INTRAVENOUS
  Filled 2016-07-01: qty 1

## 2016-07-01 MED ORDER — ONDANSETRON 8 MG PO TBDP: 8.0000 mg | ORAL_TABLET | Freq: Three times a day (TID) | ORAL | 0 refills | Status: DC | PRN

## 2016-07-01 MED ORDER — ONDANSETRON HCL 4 MG/2ML IJ SOLN
4.0000 mg | Freq: Once | INTRAMUSCULAR | Status: AC
  Administered 2016-07-01: 4 mg via INTRAVENOUS
  Filled 2016-07-01: qty 2

## 2016-07-01 MED ORDER — LACTATED RINGERS IV BOLUS (SEPSIS): 1000.0000 mL | Freq: Once | INTRAVENOUS | Status: DC

## 2016-07-01 MED ORDER — LACTATED RINGERS IV BOLUS (SEPSIS)
1000.0000 mL | Freq: Once | INTRAVENOUS | Status: AC
  Administered 2016-07-01: 1000 mL via INTRAVENOUS

## 2016-07-01 MED ORDER — DEXTROSE 5 % IN LACTATED RINGERS IV BOLUS
1000.0000 mL | Freq: Once | INTRAVENOUS | Status: AC
  Administered 2016-07-01: 1000 mL via INTRAVENOUS

## 2016-07-01 NOTE — MAU Note (Signed)
Pt presents to MAU with complaints of nausea vomiting and diarrhea since this morning. Reports child and husband have had a virus

## 2016-07-01 NOTE — Discharge Instructions (Signed)

## 2016-07-01 NOTE — MAU Provider Note (Signed)
History     CSN: [id number]  Arrival date and time: 07/01/16 1448   First Provider Initiated Contact with Patient 07/01/16 1552      Chief Complaint  Patient presents with  . Emesis   HPI Ms. Jocelyn Waters is a 35 y.o. G3P1011 at [redacted]w[redacted]d who presents to MAU today with complaint of N/V/D since last night. She denies lower abdominal pain, vaginal bleeding, LOF today. She has had issues with N/V throughout the pregnancy, but current medications of Phenergan and Reglan are not helping. She has also had diarrhea since last night. All other family members have been sick as well with the same symptoms.   OB History    Gravida Para Term Preterm AB Living   3 1 1  0 1 1   SAB TAB Ectopic Multiple Live Births   0 1 0 0 1      Past Medical History:  Diagnosis Date  . ADD (attention deficit disorder)   . ASCUS (atypical squamous cells of undetermined significance) on Pap smear 2007  . Bipolar disorder (HCC)   . GERD (gastroesophageal reflux disease)    on Pepcid at home  . Hx of varicella   . Kidney stone    Passed stone January 2016  . Pregnancy induced hypertension    Gestational hypertension  . Reflux   . Vaginal Pap smear, abnormal     Past Surgical History:  Procedure Laterality Date  . INTRAUTERINE DEVICE INSERTION  05/04/2012   Mirena  . IUD REMOVAL  06/2013  . KNEE ARTHROSCOPY    . THERAPEUTIC ABORTION  05/2006    Family History  Problem Relation Age of Onset  . Diabetes Maternal Aunt   . Hypertension Maternal Grandmother   . Hypertension Paternal Grandmother   . Diabetes Paternal Grandfather     Social History  Substance Use Topics  . Smoking status: Former Smoker    Packs/day: 0.10    Quit date: 09/01/2011  . Smokeless tobacco: Never Used  . Alcohol use No     Comment: SOCIALY    Allergies:  Allergies  Allergen Reactions  . Septra [Bactrim] Rash  . Sulfa Antibiotics Rash    Prescriptions Prior to Admission  Medication Sig Dispense Refill Last  Dose  . cetirizine (ZYRTEC) 10 MG tablet Take 10 mg by mouth daily as needed for allergies.   Past Month at Unknown time  . metoCLOPramide (REGLAN) 10 MG tablet Take 1 tablet (10 mg total) by mouth 3 (three) times daily with meals. 90 tablet 1 07/01/2016 at Unknown time  . Prenatal Vit-Fe Fumarate-FA (PRENATAL MULTIVITAMIN) TABS tablet Take 1 tablet by mouth daily at 12 noon.   06/30/2016 at Unknown time  . promethazine (PHENERGAN) 12.5 MG tablet Take 2 tablets (25 mg total) by mouth every 6 (six) hours as needed for nausea or vomiting. 30 tablet 4 07/01/2016 at Unknown time  . ranitidine (ZANTAC) 150 MG tablet Take 1 tablet (150 mg total) by mouth 2 (two) times daily. 60 tablet 1 06/30/2016 at Unknown time    Review of Systems  Constitutional: Positive for malaise/fatigue. Negative for fever.  Gastrointestinal: Positive for abdominal pain, diarrhea, heartburn, nausea and vomiting. Negative for constipation.  Genitourinary: Negative for dysuria, frequency and urgency.       Neg - vaginal bleeding, discharge, LOF   Physical Exam   Blood pressure (!) 98/54, pulse 101, temperature 99.2 F (37.3 C), temperature source Oral, resp. rate 16, height 5' 2.8" (1.595 m), weight 181 lb  12 oz (82.4 kg), last menstrual period 01/27/2016, SpO2 93 %, currently breastfeeding.  Physical Exam  Nursing note and vitals reviewed. Constitutional: She is oriented to person, place, and time. She appears well-developed and well-nourished. No distress.  HENT:  Head: Normocephalic and atraumatic.  Cardiovascular: Normal rate.   Respiratory: Effort normal.  GI: Soft. She exhibits no distension and no mass. Bowel sounds are decreased. There is no tenderness. There is no rebound and no guarding.  Neurological: She is alert and oriented to person, place, and time.  Skin: Skin is warm and dry. No erythema.  Psychiatric: She has a normal mood and affect.    Results for orders placed or performed during the hospital  encounter of 07/01/16 (from the past 24 hour(s))  CBC with Differential/Platelet     Status: Abnormal   Collection Time: 07/01/16  4:14 PM  Result Value Ref Range   WBC 14.0 (H) 4.0 - 10.5 K/uL   RBC 4.44 3.87 - 5.11 MIL/uL   Hemoglobin 13.0 12.0 - 15.0 g/dL   HCT 40.937.4 81.136.0 - 91.446.0 %   MCV 84.2 78.0 - 100.0 fL   MCH 29.3 26.0 - 34.0 pg   MCHC 34.8 30.0 - 36.0 g/dL   RDW 78.214.1 95.611.5 - 21.315.5 %   Platelets 225 150 - 400 K/uL   Neutrophils Relative % 87 %   Neutro Abs 12.3 (H) 1.7 - 7.7 K/uL   Lymphocytes Relative 10 %   Lymphs Abs 1.4 0.7 - 4.0 K/uL   Monocytes Relative 3 %   Monocytes Absolute 0.4 0.1 - 1.0 K/uL   Eosinophils Relative 0 %   Eosinophils Absolute 0.0 0.0 - 0.7 K/uL   Basophils Relative 0 %   Basophils Absolute 0.0 0.0 - 0.1 K/uL  Comprehensive metabolic panel     Status: Abnormal   Collection Time: 07/01/16  4:14 PM  Result Value Ref Range   Sodium 136 135 - 145 mmol/L   Potassium 3.5 3.5 - 5.1 mmol/L   Chloride 103 101 - 111 mmol/L   CO2 20 (L) 22 - 32 mmol/L   Glucose, Bld 97 65 - 99 mg/dL   BUN 11 6 - 20 mg/dL   Creatinine, Ser 0.860.48 0.44 - 1.00 mg/dL   Calcium 9.1 8.9 - 57.810.3 mg/dL   Total Protein 7.4 6.5 - 8.1 g/dL   Albumin 3.7 3.5 - 5.0 g/dL   AST 20 15 - 41 U/L   ALT 14 14 - 54 U/L   Alkaline Phosphatase 65 38 - 126 U/L   Total Bilirubin 0.4 0.3 - 1.2 mg/dL   GFR calc non Af Amer >60 >60 mL/min   GFR calc Af Amer >60 >60 mL/min   Anion gap 13 5 - 15    MAU Course  Procedures None  MDM FHR -  UA, CBC, CMP today Patient unable to given urine sample upon arrival. Active emesis while in MAU.  IV LR with 25 mg Phenergan given Patient reports some improvement in symptoms and is able to tolerate ice chips 1 liter IV D5LR - towards end of this liter of fluids, patient reports one episode of emesis and still does not have the urge to urinate.  1 liter LR bolus ordered with 4 mg IV Zofran given  No additional episodes of emesis after Zofran. Patient able  to tolerate ice chips  Assessment and Plan  A: SIUP at 1338w6d Viral gastroenteritis  P:  Discharge home Rx for Zofran given to patient  Warning  signs for worsening condition discussed Patient advised to [facility] as scheduled for routine prenatal care or sooner PRN Patient may return to MAU as needed or if her condition were to change or worsen   Marny LowensteinJulie N Sapphira Harjo, PA-C  07/01/2016, 7:57 PM

## 2016-07-01 NOTE — MAU Note (Signed)
Pt up to bathroom to give urine sample

## 2016-07-01 NOTE — MAU Note (Signed)
Patient was feeling better, but when she sat up she vomited again.

## 2016-08-05 NOTE — L&D Delivery Note (Addendum)
Delivery Note Patient pushed for 10 minutes after she was determined to be c/c/+3.  At 15:43 A live healthy female infant born in OP position over an intact perineum.  Anterior shoulder easily delivered followed by posterior Shoulder and body.  Infant placed on maternal abdomen and noted to have spontaneous, vigorous cry. Cord double clamped and cut. Cord blood obtained. Placenta spontaneously delivered intact w/ trailing membranes. Uterine atony alleviated with massage and IV pitocin. There were no complications. Patient tolerated delivery well.  Anesthesia: Epidural  Episiotomy: None Lacerations: None Suture Repair: n/a  Est. Blood Loss (mL): 300  Mom to postpartum.  Baby to Couplet care / Skin to Skin.  Essie Hart STACIA 12/03/2016, 4:10 PM

## 2016-09-04 DIAGNOSIS — R030 Elevated blood-pressure reading, without diagnosis of hypertension: Secondary | ICD-10-CM | POA: Diagnosis not present

## 2016-09-06 DIAGNOSIS — R030 Elevated blood-pressure reading, without diagnosis of hypertension: Secondary | ICD-10-CM | POA: Diagnosis not present

## 2016-09-17 DIAGNOSIS — Z23 Encounter for immunization: Secondary | ICD-10-CM | POA: Diagnosis not present

## 2016-10-04 ENCOUNTER — Encounter (HOSPITAL_COMMUNITY): Payer: Self-pay | Admitting: *Deleted

## 2016-10-04 ENCOUNTER — Inpatient Hospital Stay (HOSPITAL_COMMUNITY)
Admission: AD | Admit: 2016-10-04 | Discharge: 2016-10-04 | Disposition: A | Discharge status: Home / Self Care | Payer: Commercial Managed Care - HMO | Source: Ambulatory Visit | Attending: Obstetrics | Admitting: Obstetrics

## 2016-10-04 DIAGNOSIS — R030 Elevated blood-pressure reading, without diagnosis of hypertension: Secondary | ICD-10-CM | POA: Insufficient documentation

## 2016-10-04 DIAGNOSIS — O26893 Other specified pregnancy related conditions, third trimester: Secondary | ICD-10-CM | POA: Insufficient documentation

## 2016-10-04 DIAGNOSIS — O99613 Diseases of the digestive system complicating pregnancy, third trimester: Secondary | ICD-10-CM | POA: Insufficient documentation

## 2016-10-04 DIAGNOSIS — Z87891 Personal history of nicotine dependence: Secondary | ICD-10-CM | POA: Diagnosis not present

## 2016-10-04 DIAGNOSIS — K219 Gastro-esophageal reflux disease without esophagitis: Secondary | ICD-10-CM | POA: Insufficient documentation

## 2016-10-04 DIAGNOSIS — Z3A28 28 weeks gestation of pregnancy: Secondary | ICD-10-CM | POA: Diagnosis not present

## 2016-10-04 LAB — COMPREHENSIVE METABOLIC PANEL
ALBUMIN: 3 g/dL — AB (ref 3.5–5.0)
ALT: 13 U/L — ABNORMAL LOW (ref 14–54)
ANION GAP: 9 (ref 5–15)
AST: 19 U/L (ref 15–41)
Alkaline Phosphatase: 54 U/L (ref 38–126)
BILIRUBIN TOTAL: 0.2 mg/dL — AB (ref 0.3–1.2)
BUN: 8 mg/dL (ref 6–20)
CO2: 22 mmol/L (ref 22–32)
Calcium: 8.8 mg/dL — ABNORMAL LOW (ref 8.9–10.3)
Chloride: 105 mmol/L (ref 101–111)
Creatinine, Ser: 0.55 mg/dL (ref 0.44–1.00)
GFR calc Af Amer: >60 mL/min (ref >60)
Glucose, Bld: 100 mg/dL — ABNORMAL HIGH (ref 65–99)
POTASSIUM: 3.1 mmol/L — AB (ref 3.5–5.1)
Sodium: 136 mmol/L (ref 135–145)
TOTAL PROTEIN: 6.4 g/dL — AB (ref 6.5–8.1)

## 2016-10-04 LAB — URINALYSIS, ROUTINE W REFLEX MICROSCOPIC
Bilirubin Urine: NEGATIVE
GLUCOSE, UA: NEGATIVE mg/dL
Ketones, ur: NEGATIVE mg/dL
Leukocytes, UA: NEGATIVE
NITRITE: NEGATIVE
PROTEIN: NEGATIVE mg/dL
Specific Gravity, Urine: 1.008 (ref 1.005–1.030)
pH: 7 (ref 5.0–8.0)

## 2016-10-04 LAB — CBC
HEMATOCRIT: 30.5 % — AB (ref 36.0–46.0)
Hemoglobin: 10.1 g/dL — ABNORMAL LOW (ref 12.0–15.0)
MCH: 28.2 pg (ref 26.0–34.0)
MCHC: 33.1 g/dL (ref 30.0–36.0)
MCV: 85.2 fL (ref 78.0–100.0)
Platelets: 193 10*3/uL (ref 150–400)
RBC: 3.58 MIL/uL — ABNORMAL LOW (ref 3.87–5.11)
RDW: 15 % (ref 11.5–15.5)
WBC: 11.4 10*3/uL — ABNORMAL HIGH (ref 4.0–10.5)

## 2016-10-04 LAB — PROTEIN / CREATININE RATIO, URINE: CREATININE, URINE: 38 mg/dL

## 2016-10-04 NOTE — MAU Note (Addendum)
Been having BP issues for a few wks; 150's /80's today (slide in type of machine).  Was seeing spots at times this morning, ? Sinus headache. Denies constant RUQ pain, some increase in swelling.

## 2016-10-04 NOTE — MAU Provider Note (Signed)
History     CSN: [id number]  Arrival date and time: 10/04/16 1308   First Provider Initiated Contact with Patient 10/04/16 1405      Chief Complaint  Patient presents with  . Hypertension   Jocelyn Waters is a 36 y.o. G3P1011 at 5234w3d presenting with elevated blood pressures. Yesterday had 3 blood pressures in the 150s over 80s checked at work and at home. Today while at work blood pressure was 155/85. One episode of scotomata this morning when she stood up from sitting. No visual symptoms since then and denies H/A or abdominal pain. Good fetal movement. Ultrasound last week showed good fetal  growth. States her blood pressures began to be elevated in the 140s to 80s about 4 weeks ago at office visits. Had normal preeclampsia labs done 4 weeks ago and a 24-hour urine done last week with total protein 200. She has a history of preeclampsia at 35 weeks with induction of labor at 37 weeks.     OB History  Gravida Para Term Preterm AB Living  3 1 1  0 1 1  SAB TAB Ectopic Multiple Live Births  0 1 0 0 1    # Outcome Date GA Lbr Len/2nd Weight Sex Delivery Anes PTL Lv  3 Current           2 Term 12/09/14 [id number] 07:36 / 02:09 2.95 kg (6 lb 8.1 oz) F Vag-Spont EPI  LIV  1 TAB                Past Medical History:  Diagnosis Date  . ADD (attention deficit disorder)   . ASCUS (atypical squamous cells of undetermined significance) on Pap smear 2007  . Bipolar disorder (HCC)   . GERD (gastroesophageal reflux disease)    on Pepcid at home  . Hx of varicella   . Kidney stone    Passed stone January 2016  . Pregnancy induced hypertension    Gestational hypertension  . Reflux   . Vaginal Pap smear, abnormal     Past Surgical History:  Procedure Laterality Date  . INTRAUTERINE DEVICE INSERTION  05/04/2012   Mirena  . IUD REMOVAL  06/2013  . KNEE ARTHROSCOPY    . THERAPEUTIC ABORTION  05/2006    Family History  Problem Relation Age of Onset  . Diabetes Maternal Aunt   .  Hypertension Maternal Grandmother   . Hypertension Paternal Grandmother   . Diabetes Paternal Grandfather     Social History  Substance Use Topics  . Smoking status: Former Smoker    Packs/day: 0.10    Quit date: 09/01/2011  . Smokeless tobacco: Never Used  . Alcohol use No     Comment: SOCIALY    Allergies:  Allergies  Allergen Reactions  . Biaxin [Clarithromycin] Rash  . Septra [Bactrim] Rash  . Sulfa Antibiotics Rash    Prescriptions Prior to Admission  Medication Sig Dispense Refill Last Dose  . acetaminophen (TYLENOL) 325 MG tablet Take 650 mg by mouth every 6 (six) hours as needed for mild pain or moderate pain.   Past Week at Unknown time  . ferrous sulfate 325 (65 FE) MG tablet Take 325 mg by mouth 2 (two) times daily with a meal.   10/03/2016 at Unknown time  . Prenatal Vit-Fe Fumarate-FA (PRENATAL MULTIVITAMIN) TABS tablet Take 1 tablet by mouth daily at 12 noon.   10/03/2016 at Unknown time  . promethazine (PHENERGAN) 25 MG tablet Take 1 tablet by mouth every 6 (six)  hours as needed.   Past Week at Unknown time  . ranitidine (ZANTAC) 150 MG tablet Take 1 tablet (150 mg total) by mouth 2 (two) times daily. 60 tablet 1 10/04/2016 at Unknown time  . metoCLOPramide (REGLAN) 10 MG tablet Take 1 tablet (10 mg total) by mouth 3 (three) times daily with meals. (Patient not taking: Reported on 10/04/2016) 90 tablet 1 Not Taking at Unknown time  . ondansetron (ZOFRAN ODT) 8 MG disintegrating tablet Take 1 tablet (8 mg total) by mouth every 8 (eight) hours as needed for nausea or vomiting. (Patient not taking: Reported on 10/04/2016) 20 tablet 0 Not Taking at Unknown time  . promethazine (PHENERGAN) 12.5 MG tablet Take 2 tablets (25 mg total) by mouth every 6 (six) hours as needed for nausea or vomiting. (Patient not taking: Reported on 10/04/2016) 30 tablet 4 Not Taking at Unknown time    Review of Systems  Constitutional: Negative for fatigue and fever.  Respiratory: Negative for shortness  of breath.   Gastrointestinal: Negative for abdominal pain and vomiting.  Genitourinary: Negative for dysuria.  Neurological: Negative for dizziness.  Psychiatric/Behavioral: The patient is not nervous/anxious.    Physical Exam   Blood pressure 133/74, pulse 97, temperature 98.4 F (36.9 C), temperature source Oral, resp. rate 18, weight 97.4 kg (214 lb 12 oz), last menstrual period 01/27/2016, SpO2 98 %, currently breastfeeding. Vitals:   10/04/16 1350 10/04/16 1420 10/04/16 1435 10/04/16 1449  BP: 133/74 121/79 123/74 138/90  Pulse: 97 100 95 97  Resp:      Temp:      TempSrc:      SpO2:      Weight:        Isolated 138/90  Physical Exam  Nursing note and vitals reviewed. Constitutional: She is oriented to person, place, and time. She appears well-developed and well-nourished.  HENT:  Head: Normocephalic.  Eyes: Pupils are equal, round, and reactive to light.  Neck: Normal range of motion.  Cardiovascular: Normal rate.   Respiratory: Effort normal.  GI: Soft. There is no tenderness.  Musculoskeletal: Normal range of motion. She exhibits no edema.  Neurological: She is alert and oriented to person, place, and time. She displays normal reflexes.  Skin: Skin is warm and dry.  Psychiatric: She has a normal mood and affect. Her behavior is normal. Thought content normal.   MAU Course  Procedures   Results for orders placed or performed during the hospital encounter of 10/04/16 (from the past 24 hour(s))  Urinalysis, Routine w reflex microscopic     Status: Abnormal   Collection Time: 10/04/16  1:25 PM  Result Value Ref Range   Color, Urine STRAW (A) YELLOW   APPearance CLEAR CLEAR   Specific Gravity, Urine 1.008 1.005 - 1.030   pH 7.0 5.0 - 8.0   Glucose, UA NEGATIVE NEGATIVE mg/dL   Hgb urine dipstick SMALL (A) NEGATIVE   Bilirubin Urine NEGATIVE NEGATIVE   Ketones, ur NEGATIVE NEGATIVE mg/dL   Protein, ur NEGATIVE NEGATIVE mg/dL   Nitrite NEGATIVE NEGATIVE    Leukocytes, UA NEGATIVE NEGATIVE   RBC / HPF 0-5 0 - 5 RBC/hpf   WBC, UA 0-5 0 - 5 WBC/hpf   Bacteria, UA MANY (A) NONE SEEN   Squamous Epithelial / LPF 0-5 (A) NONE SEEN   Mucous PRESENT   Protein / creatinine ratio, urine     Status: None   Collection Time: 10/04/16  1:30 PM  Result Value Ref Range   Creatinine, Urine  38.00 mg/dL   Total Protein, Urine <6 mg/dL   Protein Creatinine Ratio        0.00 - 0.15 mg/mg[Cre]  CBC     Status: Abnormal   Collection Time: 10/04/16  2:14 PM  Result Value Ref Range   WBC 11.4 (H) 4.0 - 10.5 K/uL   RBC 3.58 (L) 3.87 - 5.11 MIL/uL   Hemoglobin 10.1 (L) 12.0 - 15.0 g/dL   HCT 16.1 (L) 09.6 - 04.5 %   MCV 85.2 78.0 - 100.0 fL   MCH 28.2 26.0 - 34.0 pg   MCHC 33.1 30.0 - 36.0 g/dL   RDW 40.9 81.1 - 91.4 %   Platelets 193 150 - 400 K/uL  Comprehensive metabolic panel     Status: Abnormal   Collection Time: 10/04/16  2:14 PM  Result Value Ref Range   Sodium 136 135 - 145 mmol/L   Potassium 3.1 (L) 3.5 - 5.1 mmol/L   Chloride 105 101 - 111 mmol/L   CO2 22 22 - 32 mmol/L   Glucose, Bld 100 (H) 65 - 99 mg/dL   BUN 8 6 - 20 mg/dL   Creatinine, Ser 7.82 0.44 - 1.00 mg/dL   Calcium 8.8 (L) 8.9 - 10.3 mg/dL   Total Protein 6.4 (L) 6.5 - 8.1 g/dL   Albumin 3.0 (L) 3.5 - 5.0 g/dL   AST 19 15 - 41 U/L   ALT 13 (L) 14 - 54 U/L   Alkaline Phosphatase 54 38 - 126 U/L   Total Bilirubin 0.2 (L) 0.3 - 1.2 mg/dL   GFR calc non Af Amer >60 >60 mL/min   GFR calc Af Amer >60 >60 mL/min   Anion gap 9 5 - 15   EFM: Baseline 135, moderate variability, reactive. Toco no contractions  Consulted Dr. Chestine Spore will discharge home with reassurance and preeclampsia precautions. Bring BP cuff to office to calibrate readings  Assessment and Plan   1. Elevated BP without diagnosis of hypertension    Allergies as of 10/04/2016      Reactions   Biaxin [clarithromycin] Rash   Septra [bactrim] Rash   Sulfa Antibiotics Rash      Medication List    STOP taking  these medications   acetaminophen 325 MG tablet Commonly known as:  TYLENOL   metoCLOPramide 10 MG tablet Commonly known as:  REGLAN   ondansetron 8 MG disintegrating tablet Commonly known as:  ZOFRAN ODT   promethazine 12.5 MG tablet Commonly known as:  PHENERGAN   promethazine 25 MG tablet Commonly known as:  PHENERGAN     TAKE these medications   ferrous sulfate 325 (65 FE) MG tablet Take 325 mg by mouth 2 (two) times daily with a meal.   prenatal multivitamin Tabs tablet Take 1 tablet by mouth daily at 12 noon.   ranitidine 150 MG tablet Commonly known as:  ZANTAC Take 1 tablet (150 mg total) by mouth 2 (two) times daily.      Follow-up Information    [facility] Follow up.   Contact information: [address] [place] [address] [phone]        [facility] Lizabeth Leyden, MD Follow up on 10/11/2016.   Specialty:  Obstetrics Why:  Call to move up appointment if needed Contact information: [address] [place] [address] [phone]           Caren Griffins CNM 10/04/2016, 2:11 PM

## 2016-10-04 NOTE — Discharge Instructions (Signed)
Blood Pressure Record Sheet Your blood pressure on this visit to the emergency department or clinic is elevated. This does not necessarily mean you have high blood pressure (hypertension), but it does mean that your blood pressure needs to be rechecked. Many times your blood pressure can increase due to illness, pain, anxiety, or other factors. We recommend that you get a series of blood pressure readings done over a period of 5 days. It is best to get a reading in the morning and one in the evening. You should make sure to sit and relax for 1-5 minutes before the reading is taken. Write the readings down and make a follow-up appointment with your health care provider to discuss the results. If there is not a free clinic or a drug store with a blood-pressure-taking machine near you, you can purchase blood-pressure-taking equipment from a drug store. Having one in the home allows you the convenience of taking your blood pressure while you are home and relaxed. Blood Pressure Log Date: _______________________  a.m. _____________________  p.m. _____________________ Date: _______________________  a.m. _____________________  p.m. _____________________ Date: _______________________  a.m. _____________________  p.m. _____________________ Date: _______________________  a.m. _____________________  p.m. _____________________ Date: _______________________  a.m. _____________________  p.m. _____________________ This information is not intended to replace advice given to you by your health care provider. Make sure you discuss any questions you have with your health care provider. Document Released: 04/20/2003 Document Revised: 07/05/2016 Document Reviewed: 09/14/2013 Elsevier Interactive Patient Education  2017 Elsevier Inc.  

## 2016-10-11 ENCOUNTER — Inpatient Hospital Stay (HOSPITAL_COMMUNITY)
Admission: AD | Admit: 2016-10-11 | Discharge: 2016-10-11 | Disposition: A | Discharge status: Home / Self Care | Payer: Commercial Managed Care - HMO | Source: Ambulatory Visit | Attending: Obstetrics & Gynecology | Admitting: Obstetrics & Gynecology

## 2016-10-11 ENCOUNTER — Encounter (HOSPITAL_COMMUNITY): Payer: Self-pay | Admitting: *Deleted

## 2016-10-11 DIAGNOSIS — O99613 Diseases of the digestive system complicating pregnancy, third trimester: Secondary | ICD-10-CM | POA: Diagnosis not present

## 2016-10-11 DIAGNOSIS — O09523 Supervision of elderly multigravida, third trimester: Secondary | ICD-10-CM | POA: Diagnosis not present

## 2016-10-11 DIAGNOSIS — Z87442 Personal history of urinary calculi: Secondary | ICD-10-CM | POA: Insufficient documentation

## 2016-10-11 DIAGNOSIS — Z881 Allergy status to other antibiotic agents status: Secondary | ICD-10-CM | POA: Insufficient documentation

## 2016-10-11 DIAGNOSIS — O0940 Supervision of pregnancy with grand multiparity, unspecified trimester: Secondary | ICD-10-CM

## 2016-10-11 DIAGNOSIS — Z3A29 29 weeks gestation of pregnancy: Secondary | ICD-10-CM | POA: Insufficient documentation

## 2016-10-11 DIAGNOSIS — K219 Gastro-esophageal reflux disease without esophagitis: Secondary | ICD-10-CM | POA: Insufficient documentation

## 2016-10-11 DIAGNOSIS — Z87891 Personal history of nicotine dependence: Secondary | ICD-10-CM | POA: Insufficient documentation

## 2016-10-11 DIAGNOSIS — O133 Gestational [pregnancy-induced] hypertension without significant proteinuria, third trimester: Secondary | ICD-10-CM | POA: Diagnosis not present

## 2016-10-11 DIAGNOSIS — O139 Gestational [pregnancy-induced] hypertension without significant proteinuria, unspecified trimester: Secondary | ICD-10-CM | POA: Diagnosis not present

## 2016-10-11 DIAGNOSIS — Z882 Allergy status to sulfonamides status: Secondary | ICD-10-CM | POA: Insufficient documentation

## 2016-10-11 DIAGNOSIS — I10 Essential (primary) hypertension: Secondary | ICD-10-CM | POA: Diagnosis present

## 2016-10-11 LAB — CBC
HEMATOCRIT: 30.6 % — AB (ref 36.0–46.0)
HEMOGLOBIN: 10.2 g/dL — AB (ref 12.0–15.0)
MCH: 28.3 pg (ref 26.0–34.0)
MCHC: 33.3 g/dL (ref 30.0–36.0)
MCV: 85 fL (ref 78.0–100.0)
Platelets: 152 10*3/uL (ref 150–400)
RBC: 3.6 MIL/uL — AB (ref 3.87–5.11)
RDW: 15.6 % — ABNORMAL HIGH (ref 11.5–15.5)
WBC: 10.1 10*3/uL (ref 4.0–10.5)

## 2016-10-11 LAB — COMPREHENSIVE METABOLIC PANEL
ALBUMIN: 3 g/dL — AB (ref 3.5–5.0)
ALK PHOS: 53 U/L (ref 38–126)
ALT: 12 U/L — ABNORMAL LOW (ref 14–54)
ANION GAP: 9 (ref 5–15)
AST: 23 U/L (ref 15–41)
BILIRUBIN TOTAL: 0.2 mg/dL — AB (ref 0.3–1.2)
BUN: 10 mg/dL (ref 6–20)
CALCIUM: 8.5 mg/dL — AB (ref 8.9–10.3)
CO2: 19 mmol/L — ABNORMAL LOW (ref 22–32)
Chloride: 107 mmol/L (ref 101–111)
Creatinine, Ser: 0.57 mg/dL (ref 0.44–1.00)
GLUCOSE: 133 mg/dL — AB (ref 65–99)
POTASSIUM: 3.7 mmol/L (ref 3.5–5.1)
Sodium: 135 mmol/L (ref 135–145)
TOTAL PROTEIN: 6.6 g/dL (ref 6.5–8.1)

## 2016-10-11 LAB — PROTEIN / CREATININE RATIO, URINE
CREATININE, URINE: 79 mg/dL
Protein Creatinine Ratio: 0.16 mg/mg{Cre} — ABNORMAL HIGH (ref 0.00–0.15)
TOTAL PROTEIN, URINE: 13 mg/dL

## 2016-10-11 MED ORDER — ACETAMINOPHEN 500 MG PO TABS
1000.0000 mg | ORAL_TABLET | Freq: Once | ORAL | Status: AC
  Administered 2016-10-11: 1000 mg via ORAL
  Filled 2016-10-11: qty 2

## 2016-10-11 NOTE — Discharge Instructions (Signed)
Hypertension During Pregnancy °Hypertension, commonly called high blood pressure, is when the force of blood pumping through your arteries is too strong. Arteries are blood vessels that carry blood from the heart throughout the body. Hypertension during pregnancy can cause problems for you and your baby. Your baby may be born early (prematurely) or may not weigh as much as he or she should at birth. Very bad cases of hypertension during pregnancy can be life-threatening. °Different types of hypertension can occur during pregnancy. These include: °· Chronic hypertension. This happens when: °¨ You have hypertension before pregnancy and it continues during pregnancy. °¨ You develop hypertension before you are [redacted] weeks pregnant, and it continues during pregnancy. °· Gestational hypertension. This is hypertension that develops after the 20th week of pregnancy. °· Preeclampsia, also called toxemia of pregnancy. This is a very serious type of hypertension that develops only during pregnancy. It affects the whole body, and it can be very dangerous for you and your baby. °Gestational hypertension and preeclampsia usually go away within 6 weeks after your baby is born. Women who have hypertension during pregnancy have a greater chance of developing hypertension later in life or during future pregnancies. °What are the causes? °The exact cause of hypertension is not known. °What increases the risk? °There are certain factors that make it more likely for you to develop hypertension during pregnancy. These include: °· Having hypertension during a previous pregnancy or prior to pregnancy. °· Being overweight. °· Being older than age 40. °· Being pregnant for the first time or being pregnant with more than one baby. °· Becoming pregnant using fertilization methods such as IVF (in vitro fertilization). °· Having diabetes, kidney problems, or systemic lupus erythematosus. °· Having a family history of hypertension. °What are the  signs or symptoms? °Chronic hypertension and gestational hypertension rarely cause symptoms. Preeclampsia causes symptoms, which may include: °· Increased protein in your urine. Your health care provider will check for this at every visit before you give birth (prenatal visit). °· Severe headaches. °· Sudden weight gain. °· Swelling of the hands, face, legs, and feet. °· Nausea and vomiting. °· Vision problems, such as blurred or double vision. °· Numbness in the face, arms, legs, and feet. °· Dizziness. °· Slurred speech. °· Sensitivity to bright lights. °· Abdominal pain. °· Convulsions. °How is this diagnosed? °You may be diagnosed with hypertension during a routine prenatal exam. At each prenatal visit, you may: °· Have a urine test to check for high amounts of protein in your urine. °· Have your blood pressure checked. A blood pressure reading is recorded as two numbers, such as "120 over 80" (or 120/80). The first ("top") number is called the systolic pressure. It is a measure of the pressure in your arteries when your heart beats. The second ("bottom") number is called the diastolic pressure. It is a measure of the pressure in your arteries as your heart relaxes between beats. Blood pressure is measured in a unit called mm Hg. A normal blood pressure reading is: °¨ Systolic: below 120. °¨ Diastolic: below 80. °The type of hypertension that you are diagnosed with depends on your test results and when your symptoms developed. °· Chronic hypertension is usually diagnosed before 20 weeks of pregnancy. °· Gestational hypertension is usually diagnosed after 20 weeks of pregnancy. °· Hypertension with high amounts of protein in the urine is diagnosed as preeclampsia. °· Blood pressure measurements that stay above 160 systolic, or above 110 diastolic, are signs of severe preeclampsia. °  How is this treated? °Treatment for hypertension during pregnancy varies depending on the type of hypertension you have and how  serious it is. °· If you take medicines called ACE inhibitors to treat chronic hypertension, you may need to switch medicines. ACE inhibitors should not be taken during pregnancy. °· If you have gestational hypertension, you may need to take blood pressure medicine. °· If you are at risk for preeclampsia, your health care provider may recommend that you take a low-dose aspirin every day to prevent high blood pressure during your pregnancy. °· If you have severe preeclampsia, you may need to be hospitalized so you and your baby can be monitored closely. You may also need to take medicine (magnesium sulfate) to prevent seizures and to lower blood pressure. This medicine may be given as an injection or through an IV tube. °· In some cases, if your condition gets worse, you may need to deliver your baby early. °Follow these instructions at home: °Eating and drinking °· Drink enough fluid to keep your urine clear or pale yellow. °· Eat a healthy diet that is low in salt (sodium). Do not add salt to your food. Check food labels to see how much sodium a food or beverage contains. °Lifestyle °· Do not use any products that contain nicotine or tobacco, such as cigarettes and e-cigarettes. If you need help quitting, ask your health care provider. °· Do not use alcohol. °· Avoid caffeine. °· Avoid stress as much as possible. Rest and get plenty of sleep. °General instructions °· Take over-the-counter and prescription medicines only as told by your health care provider. °· While lying down, lie on your left side. This keeps pressure off your baby. °· While sitting or lying down, raise (elevate) your feet. Try putting some pillows under your lower legs. °· Exercise regularly. Ask your health care provider what kinds of exercise are best for you. °· Keep all prenatal and follow-up visits as told by your health care provider. This is important. °Contact a health care provider if: °· You have symptoms that your health care provider  told you may require more treatment or monitoring, such as: °¨ Fever. °¨ Vomiting. °¨ Headache. °Get help right away if: °· You have severe abdominal pain or vomiting that does not get better with treatment. °· You suddenly develop swelling in your hands, ankles, or face. °· You gain 4 lbs (1.8 kg) or more in 1 week. °· You develop vaginal bleeding, or you have blood in your urine. °· You do not feel your baby moving as much as usual. °· You have blurred or double vision. °· You have muscle twitching or sudden tightening (spasms). °· You have shortness of breath. °· Your lips or fingernails turn blue. °This information is not intended to replace advice given to you by your health care provider. Make sure you discuss any questions you have with your health care provider. °Document Released: 04/09/2011 Document Revised: 02/09/2016 Document Reviewed: 01/05/2016 °Elsevier Interactive Patient Education © 2017 Elsevier Inc. ° °

## 2016-10-11 NOTE — MAU Note (Signed)
Pt sent from MD office for elevated BP, has recently developed a HA, denies visual changes.  Has mild RUQ pain for the past week.  Denies contractions, bleeding & LOF.

## 2016-10-11 NOTE — MAU Provider Note (Signed)
History     CSN: [id number]  Arrival date and time: 10/11/16 0841   First Provider Initiated Contact with Patient 10/11/16 [phone]      Chief Complaint  Patient presents with  . Hypertension   Jocelyn Waters is a 36 year old G3 P1 at 29 weeks and 3 days. She has been undergoing evaluation for pregnancy-induced hypertension versus preeclampsia over the last several weeks. She has a history of preeclampsia with her first child. She was seen here earlier this week and had normal labs and was discharged home after she had elevated blood pressures at home. Today she was seen in the office with her primary OB and was found to have an elevated blood pressure to 150s over 90s. She does not have problems with blood pressure in between her pregnancies and has not been taking any medications. She denies any blurred vision currently but has had intermittent floaters and blurred vision over the last month or so. She reports a headache with intensity of 3 out of 10 at this time. She has not taken anything for it. She denies any right upper quadrant pain or severe worsening of her swelling.    OB History    Gravida Para Term Preterm AB Living   3 1 1  0 1 1   SAB TAB Ectopic Multiple Live Births   0 1 0 0 1      Past Medical History:  Diagnosis Date  . ADD (attention deficit disorder)   . ASCUS (atypical squamous cells of undetermined significance) on Pap smear 2007  . Bipolar disorder (HCC)   . GERD (gastroesophageal reflux disease)    on Pepcid at home  . Hx of varicella   . Kidney stone    Passed stone January 2016  . Pregnancy induced hypertension    Gestational hypertension  . Reflux   . Vaginal Pap smear, abnormal     Past Surgical History:  Procedure Laterality Date  . INTRAUTERINE DEVICE INSERTION  05/04/2012   Mirena  . IUD REMOVAL  06/2013  . KNEE ARTHROSCOPY    . THERAPEUTIC ABORTION  05/2006    Family History  Problem Relation Age of Onset  . Diabetes Maternal Aunt   .  Hypertension Maternal Grandmother   . Hypertension Paternal Grandmother   . Diabetes Paternal Grandfather     Social History  Substance Use Topics  . Smoking status: Former Smoker    Packs/day: 0.10    Quit date: 09/01/2011  . Smokeless tobacco: Never Used  . Alcohol use No     Comment: SOCIALY    Allergies:  Allergies  Allergen Reactions  . Biaxin [Clarithromycin] Rash  . Septra [Bactrim] Rash  . Sulfa Antibiotics Rash    Prescriptions Prior to Admission  Medication Sig Dispense Refill Last Dose  . ferrous sulfate 325 (65 FE) MG tablet Take 325 mg by mouth 2 (two) times daily with a meal.   10/03/2016 at Unknown time  . Prenatal Vit-Fe Fumarate-FA (PRENATAL MULTIVITAMIN) TABS tablet Take 1 tablet by mouth daily at 12 noon.   10/03/2016 at Unknown time  . ranitidine (ZANTAC) 150 MG tablet Take 1 tablet (150 mg total) by mouth 2 (two) times daily. 60 tablet 1 10/04/2016 at Unknown time    Review of Systems  Constitutional: Negative for chills and fever.  HENT: Negative for congestion and rhinorrhea.   Respiratory: Negative for cough, chest tightness and shortness of breath.   Cardiovascular: Negative for chest pain, palpitations and leg swelling.  Gastrointestinal:  Negative for abdominal distention, abdominal pain, constipation, diarrhea, nausea and vomiting.  Genitourinary: Negative for difficulty urinating, dysuria and flank pain.  Neurological: Positive for headaches. Negative for dizziness and weakness.   Physical Exam   Blood pressure 142/87, pulse 108, temperature 98 F (36.7 C), temperature source Oral, resp. rate 18, last menstrual period 01/27/2016, currently breastfeeding.  Physical Exam  Constitutional: She is oriented to person, place, and time. She appears well-developed and well-nourished.  HENT:  Head: Normocephalic and atraumatic.  Eyes: Conjunctivae are normal. Pupils are equal, round, and reactive to light.  Cardiovascular: Normal rate, regular rhythm,  normal heart sounds and intact distal pulses.  Exam reveals no gallop and no friction rub.   No murmur heard. Respiratory: Effort normal and breath sounds normal. No respiratory distress. She has no wheezes.  GI: Soft. Bowel sounds are normal. She exhibits no distension. There is no tenderness. There is no rebound and no guarding.  Musculoskeletal: Normal range of motion. She exhibits no edema.  Neurological: She is alert and oriented to person, place, and time. She displays normal reflexes. No cranial nerve deficit. Coordination normal.  Skin: Skin is warm and dry. No erythema.  Psychiatric: She has a normal mood and affect. Her behavior is normal.    MAU Course  Procedures  MDM In MA U patient underwent evaluation with serial blood pressures which revealed  She additionally had CMP CBC and protein creatinine ratio. All were within normal limits.  Patient did have 1 elevated blood pressure here in MA U condition to headache that did not improve significantly with Tylenol. Discussed the case with Dr. pin was okay with patient discharged with strict precautions. She was instructed to return with any worsening of her headache. Any high blood pressures any right upper quadrant pain blurred vision or any other symptoms she felt concerned about.  Assessment and Plan  #1: Gestational hypertension: Patient now with gestational hypertension having elevated blood pressures several times over the past month. Labs are normal. Dr. Mora ApplPinn plans to start twice-weekly NSTs next week. She is instructed to return to the MA U with any worsening of symptoms over the weekend. Patient voiced understanding. She was discharged home.  Ernestina Pennaicholas Loucille Takach 10/11/2016, 9:11 AM

## 2016-10-23 ENCOUNTER — Encounter (HOSPITAL_COMMUNITY): Payer: Self-pay | Admitting: *Deleted

## 2016-10-23 ENCOUNTER — Inpatient Hospital Stay (HOSPITAL_COMMUNITY)
Admission: AD | Admit: 2016-10-23 | Discharge: 2016-10-23 | Disposition: A | Discharge status: Home / Self Care | Payer: Commercial Managed Care - HMO | Source: Ambulatory Visit | Attending: Obstetrics & Gynecology | Admitting: Obstetrics & Gynecology

## 2016-10-23 DIAGNOSIS — O4703 False labor before 37 completed weeks of gestation, third trimester: Secondary | ICD-10-CM | POA: Diagnosis not present

## 2016-10-23 DIAGNOSIS — O26893 Other specified pregnancy related conditions, third trimester: Secondary | ICD-10-CM

## 2016-10-23 DIAGNOSIS — O99613 Diseases of the digestive system complicating pregnancy, third trimester: Secondary | ICD-10-CM | POA: Diagnosis not present

## 2016-10-23 DIAGNOSIS — Z3A31 31 weeks gestation of pregnancy: Secondary | ICD-10-CM | POA: Diagnosis not present

## 2016-10-23 DIAGNOSIS — Z87891 Personal history of nicotine dependence: Secondary | ICD-10-CM | POA: Diagnosis not present

## 2016-10-23 DIAGNOSIS — R109 Unspecified abdominal pain: Secondary | ICD-10-CM | POA: Diagnosis present

## 2016-10-23 DIAGNOSIS — O133 Gestational [pregnancy-induced] hypertension without significant proteinuria, third trimester: Secondary | ICD-10-CM | POA: Diagnosis not present

## 2016-10-23 DIAGNOSIS — K219 Gastro-esophageal reflux disease without esophagitis: Secondary | ICD-10-CM | POA: Diagnosis not present

## 2016-10-23 DIAGNOSIS — O479 False labor, unspecified: Secondary | ICD-10-CM

## 2016-10-23 DIAGNOSIS — R51 Headache: Secondary | ICD-10-CM

## 2016-10-23 LAB — CBC
HCT: 32.7 % — ABNORMAL LOW (ref 36.0–46.0)
HEMOGLOBIN: 10.7 g/dL — AB (ref 12.0–15.0)
MCH: 27.5 pg (ref 26.0–34.0)
MCHC: 32.7 g/dL (ref 30.0–36.0)
MCV: 84.1 fL (ref 78.0–100.0)
Platelets: 202 10*3/uL (ref 150–400)
RBC: 3.89 MIL/uL (ref 3.87–5.11)
RDW: 15.9 % — ABNORMAL HIGH (ref 11.5–15.5)
WBC: 13.2 10*3/uL — AB (ref 4.0–10.5)

## 2016-10-23 LAB — COMPREHENSIVE METABOLIC PANEL
ALBUMIN: 3.2 g/dL — AB (ref 3.5–5.0)
ALK PHOS: 59 U/L (ref 38–126)
ALT: 13 U/L — ABNORMAL LOW (ref 14–54)
ANION GAP: 8 (ref 5–15)
AST: 17 U/L (ref 15–41)
BUN: 11 mg/dL (ref 6–20)
CALCIUM: 8.8 mg/dL — AB (ref 8.9–10.3)
CO2: 23 mmol/L (ref 22–32)
Chloride: 103 mmol/L (ref 101–111)
Creatinine, Ser: 0.48 mg/dL (ref 0.44–1.00)
GFR calc Af Amer: >60 mL/min (ref >60)
GFR calc non Af Amer: >60 mL/min (ref >60)
GLUCOSE: 84 mg/dL (ref 65–99)
POTASSIUM: 3.8 mmol/L (ref 3.5–5.1)
SODIUM: 134 mmol/L — AB (ref 135–145)
Total Bilirubin: 0.5 mg/dL (ref 0.3–1.2)
Total Protein: 7 g/dL (ref 6.5–8.1)

## 2016-10-23 LAB — PROTEIN / CREATININE RATIO, URINE
Creatinine, Urine: 118 mg/dL
PROTEIN CREATININE RATIO: 0.15 mg/mg{creat} (ref 0.00–0.15)
TOTAL PROTEIN, URINE: 18 mg/dL

## 2016-10-23 LAB — FETAL FIBRONECTIN: Fetal Fibronectin: NEGATIVE

## 2016-10-23 MED ORDER — ACETAMINOPHEN 500 MG PO TABS
1000.0000 mg | ORAL_TABLET | Freq: Once | ORAL | Status: AC
  Administered 2016-10-23: 1000 mg via ORAL
  Filled 2016-10-23: qty 2

## 2016-10-23 MED ORDER — METOCLOPRAMIDE HCL 10 MG PO TABS
10.0000 mg | ORAL_TABLET | Freq: Once | ORAL | Status: AC
  Administered 2016-10-23: 10 mg via ORAL
  Filled 2016-10-23: qty 1

## 2016-10-23 MED ORDER — NIFEDIPINE 10 MG PO CAPS
10.0000 mg | ORAL_CAPSULE | Freq: Once | ORAL | Status: AC
  Administered 2016-10-23: 10 mg via ORAL
  Filled 2016-10-23: qty 1

## 2016-10-23 MED ORDER — BETAMETHASONE SOD PHOS & ACET 6 (3-3) MG/ML IJ SUSP
12.0000 mg | Freq: Once | INTRAMUSCULAR | Status: AC
  Administered 2016-10-23: 12 mg via INTRAMUSCULAR
  Filled 2016-10-23: qty 2

## 2016-10-23 NOTE — MAU Provider Note (Signed)
History     CSN: [id number]  Arrival date and time: 10/23/16 1325   First Provider Initiated Contact with Patient 10/23/16 1458      Chief Complaint  Patient presents with  . Hypertension  . Abdominal Pain   HPI   Ms.Jocelyn Waters is a 36 y.o. female G61P1011 @ 27w1dhere in MAU with complaints of nausea, HA, and pain in her upper abdomen. The pain is worse on the upper right side.  Every once in a while she see's a single floater. She does not see "spots".  She has gestational HTN and her BP's have fluctuated throughout the pregnancy. She has not taken any medication for the HA. + fetal movement, denies vaginal bleeding.   OB History    Gravida Para Term Preterm AB Living   _0 0 1 1   SAB TAB Ectopic Multiple Live Births   0 1 0 0 1      Past Medical History:  Diagnosis Date  . ADD (attention deficit disorder)   . ASCUS (atypical squamous cells of undetermined significance) on Pap smear 2007  . Bipolar disorder ([place]   . GERD (gastroesophageal reflux disease)    on Pepcid at home  . Hx of varicella   . Kidney stone    Passed stone January 2016  . Pregnancy induced hypertension    Gestational hypertension  . Reflux   . Vaginal Pap smear, abnormal     Past Surgical History:  Procedure Laterality Date  . INTRAUTERINE DEVICE INSERTION  05/04/2012   Mirena  . IUD REMOVAL  06/2013  . KNEE ARTHROSCOPY    . THERAPEUTIC ABORTION  05/2006    Family History  Problem Relation Age of Onset  . Diabetes Maternal Aunt   . Hypertension Maternal Grandmother   . Hypertension Paternal Grandmother   . Diabetes Paternal Grandfather     Social History  Substance Use Topics  . Smoking status: Former Smoker    Packs/day: 0.10    Quit date: 09/01/2011  . Smokeless tobacco: Never Used  . Alcohol use No     Comment: SOCIALY    Allergies:  Allergies  Allergen Reactions  . Biaxin [Clarithromycin] Rash  . Septra [Bactrim] Rash  . Sulfa Antibiotics Rash     Prescriptions Prior to Admission  Medication Sig Dispense Refill Last Dose  . ferrous sulfate 325 (65 FE) MG tablet Take 325 mg by mouth 2 (two) times daily with a meal.   10/23/2016 at Unknown time  . Prenatal Vit-Fe Fumarate-FA (PRENATAL MULTIVITAMIN) TABS tablet Take 1 tablet by mouth at bedtime.    10/22/2016 at Unknown time  . promethazine (PHENERGAN) 25 MG tablet Take 25 mg by mouth every 6 (six) hours as needed for nausea or vomiting.   10/23/2016 at Unknown time  . ranitidine (ZANTAC) 150 MG tablet Take 1 tablet (150 mg total) by mouth 2 (two) times daily. 60 tablet 1 10/23/2016 at Unknown time  . acetaminophen (TYLENOL) 325 MG tablet Take 650 mg by mouth every 6 (six) hours as needed for mild pain or headache.   10/21/2016   Results for orders placed or performed during the hospital encounter of 10/23/16 (from the past 48 hour(s))  Protein / creatinine ratio, urine     Status: None   Collection Time: 10/23/16  1:41 PM  Result Value Ref Range   Creatinine, Urine 118.00 mg/dL   Total Protein, Urine 18 mg/dL    Comment: NO NORMAL RANGE ESTABLISHED FOR  THIS TEST   Protein Creatinine Ratio 0.15 0.00 - 0.15 mg/mg[Cre]  CBC     Status: Abnormal   Collection Time: 10/23/16  3:07 PM  Result Value Ref Range   WBC 13.2 (H) 4.0 - 10.5 K/uL   RBC 3.89 3.87 - 5.11 MIL/uL   Hemoglobin 10.7 (L) 12.0 - 15.0 g/dL   HCT 32.7 (L) 36.0 - 46.0 %   MCV 84.1 78.0 - 100.0 fL   MCH 27.5 26.0 - 34.0 pg   MCHC 32.7 30.0 - 36.0 g/dL   RDW 15.9 (H) 11.5 - 15.5 %   Platelets 202 150 - 400 K/uL  Comprehensive metabolic panel     Status: Abnormal   Collection Time: 10/23/16  3:07 PM  Result Value Ref Range   Sodium 134 (L) 135 - 145 mmol/L   Potassium 3.8 3.5 - 5.1 mmol/L   Chloride 103 101 - 111 mmol/L   CO2 23 22 - 32 mmol/L   Glucose, Bld 84 65 - 99 mg/dL   BUN 11 6 - 20 mg/dL   Creatinine, Ser 0.48 0.44 - 1.00 mg/dL   Calcium 8.8 (L) 8.9 - 10.3 mg/dL   Total Protein 7.0 6.5 - 8.1 g/dL   Albumin  3.2 (L) 3.5 - 5.0 g/dL   AST 17 15 - 41 U/L   ALT 13 (L) 14 - 54 U/L   Alkaline Phosphatase 59 38 - 126 U/L   Total Bilirubin 0.5 0.3 - 1.2 mg/dL   GFR calc non Af Amer >60 >60 mL/min   GFR calc Af Amer >60 >60 mL/min    Comment: (NOTE) The eGFR has been calculated using the CKD EPI equation. This calculation has not been validated in all clinical situations. eGFR's persistently <60 mL/min signify possible Chronic Kidney Disease.    Anion gap 8 5 - 15  Fetal fibronectin     Status: None   Collection Time: 10/23/16  3:38 PM  Result Value Ref Range   Fetal Fibronectin NEGATIVE NEGATIVE    Review of Systems  Neurological: Positive for headaches (Has not taken anything for HA.).   Physical Exam   Blood pressure 130/76, pulse (!) 107, temperature 97.7 F (36.5 C), temperature source Oral, resp. rate 20, last menstrual period 01/27/2016, currently breastfeeding.   Patient Vitals for the past 24 hrs:  BP Temp Temp src Pulse Resp  10/23/16 1708 122/70 - - - -  10/23/16 1648 122/67 - - (!) 107 -  10/23/16 1631 (!) 112/58 - - 98 -  10/23/16 1616 115/64 - - (!) 102 -  10/23/16 1601 127/71 - - (!) 103 -  10/23/16 1551 110/70 - - - -  10/23/16 1546 110/70 - - (!) 118 18  10/23/16 1528 135/79 - - 100 -  10/23/16 1405 130/76 - - (!) 107 20  10/23/16 1337 (!) 149/86 97.7 F (36.5 C) Oral (!) 106 18    Physical Exam  Constitutional: She is oriented to person, place, and time. She appears well-developed and well-nourished. No distress.  HENT:  Head: Normocephalic.  Eyes: Pupils are equal, round, and reactive to light.  Cardiovascular: Normal rate.   Respiratory: Effort normal.  GI: Soft. She exhibits no distension. There is no tenderness. There is no rebound.  Musculoskeletal: Normal range of motion.  Neurological: She is alert and oriented to person, place, and time.  Reflex Scores:      Patellar reflexes are 3+ on the right side and 3+ on the left side. Negative clonus     Skin:  Skin is warm. She is not diaphoretic.  Psychiatric: Her behavior is normal.   Fetal Tracing: Baseline: 130 bpm Variability: Moderate  Accelerations: 15x15 Decelerations: None Toco: Q3-4; patient not feeling the contractions    MAU Course  Procedures  None  MDM  PIH labs PCR  Fetal fibronectin negative  Tylenol 1 gram given PO Reglan PO Procardia Dr. Pinn  To MAU to see patient Betamethasone given  HA down from 5-3/10  Assessment and Plan   A:  1. Gestational hypertension without significant proteinuria in third trimester   2. Headache in pregnancy, antepartum, third trimester   3. Braxton Hick's contraction     P:  Discharge home in stable condition Betamethasone # 2 tomorrow 3/22: patient instructed to return to MAU Preeclampsia precautions Ok to use tylenol over the counter, as directed on the bottle.  Return to MAU if symptoms worsen  Go to the office Friday for BP check per Dr. Pinn.   Jennifer I Rasch, NP 10/23/2016 7:20 PM   

## 2016-10-23 NOTE — Progress Notes (Signed)
CNM informed pt having ctxs, but unable to feel them.

## 2016-10-23 NOTE — Discharge Instructions (Signed)
Hypertension During Pregnancy Hypertension is also called high blood pressure. High blood pressure means that the force of your blood moving in your body is too strong. When you are pregnant, this condition should be watched carefully. It can cause problems for you and your baby. Follow these instructions at home: Eating and drinking  Drink enough fluid to keep your pee (urine) clear or pale yellow.  Eat healthy foods that are low in salt (sodium). ? Do not add salt to your food. ? Check labels on foods and drinks to see much salt is in them. Look on the label where you see "Sodium." Lifestyle  Do not use any products that contain nicotine or tobacco, such as cigarettes and e-cigarettes. If you need help quitting, ask your doctor.  Do not use alcohol.  Avoid caffeine.  Avoid stress. Rest and get plenty of sleep. General instructions  Take over-the-counter and prescription medicines only as told by your doctor.  While lying down, lie on your left side. This keeps pressure off your baby.  While sitting or lying down, raise (elevate) your feet. Try putting some pillows under your lower legs.  Exercise regularly. Ask your doctor what kinds of exercise are best for you.  Keep all prenatal and follow-up visits as told by your doctor. This is important. Contact a doctor if:  You have symptoms that your doctor told you to watch for, such as: ? Fever. ? Throwing up (vomiting). ? Headache. Get help right away if:  You have very bad pain in your belly (abdomen).  You are throwing up, and this does not get better with treatment.  You suddenly get swelling in your hands, ankles, or face.  You gain 4 lb (1.8 kg) or more in 1 week.  You get bleeding from your vagina.  You have blood in your pee.  You do not feel your baby moving as much as normal.  You have a change in vision.  You have muscle twitching or sudden tightening (spasms).  You have trouble breathing.  Your lips  or fingernails turn blue. This information is not intended to replace advice given to you by your health care provider. Make sure you discuss any questions you have with your health care provider. Document Released: 08/24/2010 Document Revised: 04/02/2016 Document Reviewed: 04/02/2016 Elsevier Interactive Patient Education  2017 [employer]  

## 2016-10-23 NOTE — MAU Note (Signed)
Pt started feeling nauseated with RUQ pain last night.  "My head doesn't feel right."  Seeing spots occasionally.  BP 147/81 @ work today.  Having some abd cramping today, denies bleeding or LOF.  Hx of preelampsia.

## 2016-10-23 NOTE — MAU Note (Signed)
Pt presents with c/o H/A, RUQ pain, and visual disturbances.  Reports symptoms began last night.  Pt states had Preeclampsia w/ previous pregnancy & wanted to be evaluated.

## 2016-10-24 ENCOUNTER — Inpatient Hospital Stay (HOSPITAL_COMMUNITY)
Admission: AD | Admit: 2016-10-24 | Discharge: 2016-10-24 | Disposition: A | Discharge status: Home / Self Care | Payer: Commercial Managed Care - HMO | Source: Ambulatory Visit | Attending: Obstetrics and Gynecology | Admitting: Obstetrics and Gynecology

## 2016-10-24 DIAGNOSIS — O4703 False labor before 37 completed weeks of gestation, third trimester: Secondary | ICD-10-CM | POA: Insufficient documentation

## 2016-10-24 DIAGNOSIS — Z3A31 31 weeks gestation of pregnancy: Secondary | ICD-10-CM | POA: Insufficient documentation

## 2016-10-24 MED ORDER — BETAMETHASONE SOD PHOS & ACET 6 (3-3) MG/ML IJ SUSP
12.0000 mg | Freq: Once | INTRAMUSCULAR | Status: AC
  Administered 2016-10-24: 12 mg via INTRAMUSCULAR
  Filled 2016-10-24: qty 2

## 2016-10-24 NOTE — MAU Note (Signed)
Pt here for second dose of betamethsone. Pt denies contracitons, bleeding, and leaking of fluid. Pt states she is feeling good today with no problems. Pt states baby is moving normally.

## 2016-11-19 ENCOUNTER — Other Ambulatory Visit: Payer: Self-pay | Admitting: Obstetrics and Gynecology

## 2016-11-19 LAB — OB RESULTS CONSOLE GBS: GBS: NEGATIVE

## 2016-11-27 ENCOUNTER — Other Ambulatory Visit: Payer: Self-pay | Admitting: Obstetrics & Gynecology

## 2016-11-29 ENCOUNTER — Inpatient Hospital Stay (HOSPITAL_COMMUNITY)
Admission: AD | Admit: 2016-11-29 | Discharge: 2016-11-29 | Disposition: A | Discharge status: Home / Self Care | Payer: Commercial Managed Care - HMO | Source: Ambulatory Visit | Attending: Obstetrics and Gynecology | Admitting: Obstetrics and Gynecology

## 2016-11-29 ENCOUNTER — Encounter (HOSPITAL_COMMUNITY): Payer: Self-pay | Admitting: *Deleted

## 2016-11-29 DIAGNOSIS — Z3689 Encounter for other specified antenatal screening: Secondary | ICD-10-CM

## 2016-11-29 DIAGNOSIS — H538 Other visual disturbances: Secondary | ICD-10-CM | POA: Diagnosis present

## 2016-11-29 DIAGNOSIS — O99343 Other mental disorders complicating pregnancy, third trimester: Secondary | ICD-10-CM | POA: Insufficient documentation

## 2016-11-29 DIAGNOSIS — O99613 Diseases of the digestive system complicating pregnancy, third trimester: Secondary | ICD-10-CM | POA: Diagnosis not present

## 2016-11-29 DIAGNOSIS — F319 Bipolar disorder, unspecified: Secondary | ICD-10-CM | POA: Diagnosis not present

## 2016-11-29 DIAGNOSIS — O99283 Endocrine, nutritional and metabolic diseases complicating pregnancy, third trimester: Secondary | ICD-10-CM | POA: Diagnosis not present

## 2016-11-29 DIAGNOSIS — Z87891 Personal history of nicotine dependence: Secondary | ICD-10-CM | POA: Diagnosis not present

## 2016-11-29 DIAGNOSIS — E876 Hypokalemia: Secondary | ICD-10-CM

## 2016-11-29 DIAGNOSIS — K219 Gastro-esophageal reflux disease without esophagitis: Secondary | ICD-10-CM | POA: Diagnosis not present

## 2016-11-29 DIAGNOSIS — O133 Gestational [pregnancy-induced] hypertension without significant proteinuria, third trimester: Secondary | ICD-10-CM | POA: Diagnosis not present

## 2016-11-29 DIAGNOSIS — Z87442 Personal history of urinary calculi: Secondary | ICD-10-CM | POA: Insufficient documentation

## 2016-11-29 DIAGNOSIS — Z3A36 36 weeks gestation of pregnancy: Secondary | ICD-10-CM

## 2016-11-29 DIAGNOSIS — R51 Headache: Secondary | ICD-10-CM | POA: Diagnosis present

## 2016-11-29 DIAGNOSIS — O9989 Other specified diseases and conditions complicating pregnancy, childbirth and the puerperium: Secondary | ICD-10-CM | POA: Diagnosis present

## 2016-11-29 LAB — COMPREHENSIVE METABOLIC PANEL
ALK PHOS: 72 U/L (ref 38–126)
ALT: 12 U/L — ABNORMAL LOW (ref 14–54)
ANION GAP: 9 (ref 5–15)
AST: 18 U/L (ref 15–41)
Albumin: 3.1 g/dL — ABNORMAL LOW (ref 3.5–5.0)
BILIRUBIN TOTAL: 0.5 mg/dL (ref 0.3–1.2)
BUN: 7 mg/dL (ref 6–20)
CALCIUM: 8.6 mg/dL — AB (ref 8.9–10.3)
CO2: 21 mmol/L — ABNORMAL LOW (ref 22–32)
Chloride: 105 mmol/L (ref 101–111)
Creatinine, Ser: 0.58 mg/dL (ref 0.44–1.00)
GFR calc Af Amer: >60 mL/min (ref >60)
Glucose, Bld: 118 mg/dL — ABNORMAL HIGH (ref 65–99)
POTASSIUM: 3.1 mmol/L — AB (ref 3.5–5.1)
Sodium: 135 mmol/L (ref 135–145)
TOTAL PROTEIN: 6.3 g/dL — AB (ref 6.5–8.1)

## 2016-11-29 LAB — CBC
HEMATOCRIT: 29.8 % — AB (ref 36.0–46.0)
HEMOGLOBIN: 10.1 g/dL — AB (ref 12.0–15.0)
MCH: 28.2 pg (ref 26.0–34.0)
MCHC: 33.9 g/dL (ref 30.0–36.0)
MCV: 83.2 fL (ref 78.0–100.0)
Platelets: 168 10*3/uL (ref 150–400)
RBC: 3.58 MIL/uL — ABNORMAL LOW (ref 3.87–5.11)
RDW: 16.5 % — AB (ref 11.5–15.5)
WBC: 9.8 10*3/uL (ref 4.0–10.5)

## 2016-11-29 LAB — PROTEIN / CREATININE RATIO, URINE: Creatinine, Urine: 29 mg/dL

## 2016-11-29 MED ORDER — ACETAMINOPHEN 500 MG PO TABS
1000.0000 mg | ORAL_TABLET | Freq: Four times a day (QID) | ORAL | Status: DC | PRN
  Administered 2016-11-29: 1000 mg via ORAL
  Filled 2016-11-29: qty 2

## 2016-11-29 MED ORDER — POTASSIUM CHLORIDE CRYS ER 20 MEQ PO TBCR: 40.0000 meq | EXTENDED_RELEASE_TABLET | Freq: Every day | ORAL | 0 refills | Status: DC

## 2016-11-29 NOTE — Discharge Instructions (Signed)
Hypertension During Pregnancy °Hypertension, commonly called high blood pressure, is when the force of blood pumping through your arteries is too strong. Arteries are blood vessels that carry blood from the heart throughout the body. Hypertension during pregnancy can cause problems for you and your baby. Your baby may be born early (prematurely) or may not weigh as much as he or she should at birth. Very bad cases of hypertension during pregnancy can be life-threatening. °Different types of hypertension can occur during pregnancy. These include: °· Chronic hypertension. This happens when: °¨ You have hypertension before pregnancy and it continues during pregnancy. °¨ You develop hypertension before you are [redacted] weeks pregnant, and it continues during pregnancy. °· Gestational hypertension. This is hypertension that develops after the 20th week of pregnancy. °· Preeclampsia, also called toxemia of pregnancy. This is a very serious type of hypertension that develops only during pregnancy. It affects the whole body, and it can be very dangerous for you and your baby. °Gestational hypertension and preeclampsia usually go away within 6 weeks after your baby is born. Women who have hypertension during pregnancy have a greater chance of developing hypertension later in life or during future pregnancies. °What are the causes? °The exact cause of hypertension is not known. °What increases the risk? °There are certain factors that make it more likely for you to develop hypertension during pregnancy. These include: °· Having hypertension during a previous pregnancy or prior to pregnancy. °· Being overweight. °· Being older than age 40. °· Being pregnant for the first time or being pregnant with more than one baby. °· Becoming pregnant using fertilization methods such as IVF (in vitro fertilization). °· Having diabetes, kidney problems, or systemic lupus erythematosus. °· Having a family history of hypertension. °What are the  signs or symptoms? °Chronic hypertension and gestational hypertension rarely cause symptoms. Preeclampsia causes symptoms, which may include: °· Increased protein in your urine. Your health care provider will check for this at every visit before you give birth (prenatal visit). °· Severe headaches. °· Sudden weight gain. °· Swelling of the hands, face, legs, and feet. °· Nausea and vomiting. °· Vision problems, such as blurred or double vision. °· Numbness in the face, arms, legs, and feet. °· Dizziness. °· Slurred speech. °· Sensitivity to bright lights. °· Abdominal pain. °· Convulsions. °How is this diagnosed? °You may be diagnosed with hypertension during a routine prenatal exam. At each prenatal visit, you may: °· Have a urine test to check for high amounts of protein in your urine. °· Have your blood pressure checked. A blood pressure reading is recorded as two numbers, such as "120 over 80" (or 120/80). The first ("top") number is called the systolic pressure. It is a measure of the pressure in your arteries when your heart beats. The second ("bottom") number is called the diastolic pressure. It is a measure of the pressure in your arteries as your heart relaxes between beats. Blood pressure is measured in a unit called mm Hg. A normal blood pressure reading is: °¨ Systolic: below 120. °¨ Diastolic: below 80. °The type of hypertension that you are diagnosed with depends on your test results and when your symptoms developed. °· Chronic hypertension is usually diagnosed before 20 weeks of pregnancy. °· Gestational hypertension is usually diagnosed after 20 weeks of pregnancy. °· Hypertension with high amounts of protein in the urine is diagnosed as preeclampsia. °· Blood pressure measurements that stay above 160 systolic, or above 110 diastolic, are signs of severe preeclampsia. °  How is this treated? °Treatment for hypertension during pregnancy varies depending on the type of hypertension you have and how  serious it is. °· If you take medicines called ACE inhibitors to treat chronic hypertension, you may need to switch medicines. ACE inhibitors should not be taken during pregnancy. °· If you have gestational hypertension, you may need to take blood pressure medicine. °· If you are at risk for preeclampsia, your health care provider may recommend that you take a low-dose aspirin every day to prevent high blood pressure during your pregnancy. °· If you have severe preeclampsia, you may need to be hospitalized so you and your baby can be monitored closely. You may also need to take medicine (magnesium sulfate) to prevent seizures and to lower blood pressure. This medicine may be given as an injection or through an IV tube. °· In some cases, if your condition gets worse, you may need to deliver your baby early. °Follow these instructions at home: °Eating and drinking °· Drink enough fluid to keep your urine clear or pale yellow. °· Eat a healthy diet that is low in salt (sodium). Do not add salt to your food. Check food labels to see how much sodium a food or beverage contains. °Lifestyle °· Do not use any products that contain nicotine or tobacco, such as cigarettes and e-cigarettes. If you need help quitting, ask your health care provider. °· Do not use alcohol. °· Avoid caffeine. °· Avoid stress as much as possible. Rest and get plenty of sleep. °General instructions °· Take over-the-counter and prescription medicines only as told by your health care provider. °· While lying down, lie on your left side. This keeps pressure off your baby. °· While sitting or lying down, raise (elevate) your feet. Try putting some pillows under your lower legs. °· Exercise regularly. Ask your health care provider what kinds of exercise are best for you. °· Keep all prenatal and follow-up visits as told by your health care provider. This is important. °Contact a health care provider if: °· You have symptoms that your health care provider  told you may require more treatment or monitoring, such as: °¨ Fever. °¨ Vomiting. °¨ Headache. °Get help right away if: °· You have severe abdominal pain or vomiting that does not get better with treatment. °· You suddenly develop swelling in your hands, ankles, or face. °· You gain 4 lbs (1.8 kg) or more in 1 week. °· You develop vaginal bleeding, or you have blood in your urine. °· You do not feel your baby moving as much as usual. °· You have blurred or double vision. °· You have muscle twitching or sudden tightening (spasms). °· You have shortness of breath. °· Your lips or fingernails turn blue. °This information is not intended to replace advice given to you by your health care provider. Make sure you discuss any questions you have with your health care provider. °Document Released: 04/09/2011 Document Revised: 02/09/2016 Document Reviewed: 01/05/2016 °Elsevier Interactive Patient Education © 2017 Elsevier Inc. ° °

## 2016-11-29 NOTE — MAU Provider Note (Signed)
History     CSN: [id number]  Arrival date and time: 11/29/16 1514   None     Chief Complaint  Patient presents with  . Non-stress Test   G3P1011 .3 wks sent from office for pre-e workup and NRNST. Pt reports HA and blurry vision earlier today. Gaylyn Rong is frontal and she has not taken anything for it. She denies epigastric pain. Good FM. No VB or LOF. Some irregular ctx are present. She has hx of pre-e in her first pregnancy and GHTN this pregnancy since 26 wks. She has completed BMZ series.   OB History    Gravida Para Term Preterm AB Living   0 1 1   SAB TAB Ectopic Multiple Live Births   0 1 0 0 1      Past Medical History:  Diagnosis Date  . ADD (attention deficit disorder)   . ASCUS (atypical squamous cells of undetermined significance) on Pap smear 2007  . Bipolar disorder (HCC)   . GERD (gastroesophageal reflux disease)    on Pepcid at home  . Hx of varicella   . Kidney stone    Passed stone January 2016  . Pregnancy induced hypertension    Gestational hypertension  . Reflux   . Vaginal Pap smear, abnormal     Past Surgical History:  Procedure Laterality Date  . INTRAUTERINE DEVICE INSERTION  05/04/2012   Mirena  . IUD REMOVAL  06/2013  . KNEE ARTHROSCOPY    . THERAPEUTIC ABORTION  05/2006    Family History  Problem Relation Age of Onset  . Diabetes Maternal Aunt   . Hypertension Maternal Grandmother   . Hypertension Paternal Grandmother   . Diabetes Paternal Grandfather     Social History  Substance Use Topics  . Smoking status: Former Smoker    Packs/day: 0.10    Quit date: 09/01/2011  . Smokeless tobacco: Never Used  . Alcohol use No     Comment: SOCIALY    Allergies:  Allergies  Allergen Reactions  . Biaxin [Clarithromycin] Rash  . Septra [Bactrim] Rash  . Sulfa Antibiotics Rash    Prescriptions Prior to Admission  Medication Sig Dispense Refill Last Dose  . acetaminophen (TYLENOL) 325 MG tablet Take 650 mg by mouth every 6  (six) hours as needed for mild pain or headache.   11/29/2016 at Unknown time  . ferrous sulfate 325 (65 FE) MG tablet Take 325 mg by mouth 2 (two) times daily with a meal.   11/29/2016 at Unknown time  . Prenatal Vit-Fe Fumarate-FA (PRENATAL MULTIVITAMIN) TABS tablet Take 1 tablet by mouth at bedtime.    11/28/2016 at Unknown time  . promethazine (PHENERGAN) 25 MG tablet Take 25 mg by mouth every 6 (six) hours as needed for nausea or vomiting.   11/28/2016 at Unknown time  . ranitidine (ZANTAC) 150 MG tablet Take 1 tablet (150 mg total) by mouth 2 (two) times daily. 60 tablet 1 11/29/2016 at Unknown time    Review of Systems  Eyes: Positive for visual disturbance.  Gastrointestinal: Negative for abdominal pain.  Genitourinary: Negative for vaginal bleeding and vaginal discharge.  Neurological: Positive for headaches.   Physical Exam   Blood pressure 134/68, pulse 89, temperature 98.2 F (36.8 C), resp. rate 18, last menstrual period 01/27/2016, currently breastfeeding.  Patient Vitals for the past 24 hrs:  BP Temp Pulse Resp  11/29/16 1741 134/68 - 89 -  11/29/16 1721 131/75 - 91 -  11/29/16 1703 134/73 -  94 -  11/29/16 1646 134/75 - 98 -  11/29/16 1631 126/79 - (!) 115 -  11/29/16 1616 126/77 - 100 -  11/29/16 1546 (!) 148/73 - 97 -  11/29/16 1532 (!) 141/76 - 100 -  11/29/16 1525 (!) 146/76 98.2 F (36.8 C) (!) 103 18    Physical Exam  Constitutional: She is oriented to person, place, and time. She appears well-developed and well-nourished. No distress.  HENT:  Head: Normocephalic and atraumatic.  Neck: Normal range of motion.  Respiratory: Effort normal. No respiratory distress.  GI: Soft. Bowel sounds are normal.  Musculoskeletal: Normal range of motion. She exhibits no edema.  Neurological: She is alert and oriented to person, place, and time.  Skin: Skin is warm and dry.  Psychiatric: She has a normal mood and affect.   EFM: 135 bpm, mod variability, + accels, no  decels Toco: 4-6, mild  Results for orders placed or performed during the hospital encounter of 11/29/16 (from the past 24 hour(s))  CBC     Status: Abnormal   Collection Time: 11/29/16  3:51 PM  Result Value Ref Range   WBC 9.8 4.0 - 10.5 K/uL   RBC 3.58 (L) 3.87 - 5.11 MIL/uL   Hemoglobin 10.1 (L) 12.0 - 15.0 g/dL   HCT 16.1 (L) 09.6 - 04.5 %   MCV 83.2 78.0 - 100.0 fL   MCH 28.2 26.0 - 34.0 pg   MCHC 33.9 30.0 - 36.0 g/dL   RDW 40.9 (H) 81.1 - 91.4 %   Platelets 168 150 - 400 K/uL  Comprehensive metabolic panel     Status: Abnormal   Collection Time: 11/29/16  3:51 PM  Result Value Ref Range   Sodium 135 135 - 145 mmol/L   Potassium 3.1 (L) 3.5 - 5.1 mmol/L   Chloride 105 101 - 111 mmol/L   CO2 21 (L) 22 - 32 mmol/L   Glucose, Bld 118 (H) 65 - 99 mg/dL   BUN 7 6 - 20 mg/dL   Creatinine, Ser 7.82 0.44 - 1.00 mg/dL   Calcium 8.6 (L) 8.9 - 10.3 mg/dL   Total Protein 6.3 (L) 6.5 - 8.1 g/dL   Albumin 3.1 (L) 3.5 - 5.0 g/dL   AST 18 15 - 41 U/L   ALT 12 (L) 14 - 54 U/L   Alkaline Phosphatase 72 38 - 126 U/L   Total Bilirubin 0.5 0.3 - 1.2 mg/dL   GFR calc non Af Amer >60 >60 mL/min   GFR calc Af Amer >60 >60 mL/min   Anion gap 9 5 - 15  Protein / creatinine ratio, urine     Status: None   Collection Time: 11/29/16  4:33 PM  Result Value Ref Range   Creatinine, Urine 29.00 mg/dL   Total Protein, Urine <6 mg/dL   Protein Creatinine Ratio        0.00 - 0.15 mg/mg[Cre]   MAU Course  Procedures Tylenol 1g po  MDM Labs ordered and reviewed. No evidence of pre-e. HA improved. Presentation, clinical findings, and plan discussed with Dr. Claiborne Billings. Stable for discharge home.   Assessment and Plan   1. [redacted] weeks gestation of pregnancy   2. NST (non-stress test) reactive   3. Pregnancy-induced hypertension in third trimester   4. Hypokalemia    Discharge home Follow up in 3 days at [facility] for IOL Pre-e precautions  Allergies as of 11/29/2016      Reactions   Biaxin  [clarithromycin] Rash   Septra [bactrim]  Rash   Sulfa Antibiotics Rash      Medication List    TAKE these medications   acetaminophen 325 MG tablet Commonly known as:  TYLENOL Take 650 mg by mouth every 6 (six) hours as needed for mild pain or headache.   ferrous sulfate 325 (65 FE) MG tablet Take 325 mg by mouth 2 (two) times daily with a meal.   potassium chloride SA 20 MEQ tablet Commonly known as:  K-DUR,KLOR-CON Take 2 tablets (40 mEq total) by mouth daily.   prenatal multivitamin Tabs tablet Take 1 tablet by mouth at bedtime.   promethazine 25 MG tablet Commonly known as:  PHENERGAN Take 25 mg by mouth every 6 (six) hours as needed for nausea or vomiting.   ranitidine 150 MG tablet Commonly known as:  ZANTAC Take 1 tablet (150 mg total) by mouth 2 (two) times daily.      Donette Larry, CNM 11/29/2016, 6:28 PM

## 2016-11-29 NOTE — MAU Note (Addendum)
p sent from office for prolonged monitoring due to non reactive NST for Hypertension. Pt reports she does have slight headache and had some blurred vision earlier today. MD office aware. Reports increased in her contractions .

## 2016-12-03 ENCOUNTER — Encounter (HOSPITAL_COMMUNITY): Payer: Self-pay | Admitting: *Deleted

## 2016-12-03 ENCOUNTER — Inpatient Hospital Stay (HOSPITAL_COMMUNITY): Payer: Commercial Managed Care - HMO | Admitting: Anesthesiology

## 2016-12-03 ENCOUNTER — Inpatient Hospital Stay (HOSPITAL_COMMUNITY)
Admission: AD | Admit: 2016-12-03 | Discharge: 2016-12-05 | DRG: 775 | Disposition: A | Discharge status: Home / Self Care | Payer: Commercial Managed Care - HMO | Source: Ambulatory Visit | Attending: Obstetrics & Gynecology | Admitting: Obstetrics & Gynecology

## 2016-12-03 DIAGNOSIS — O134 Gestational [pregnancy-induced] hypertension without significant proteinuria, complicating childbirth: Secondary | ICD-10-CM | POA: Diagnosis not present

## 2016-12-03 DIAGNOSIS — Z6841 Body Mass Index (BMI) 40.0 and over, adult: Secondary | ICD-10-CM | POA: Diagnosis not present

## 2016-12-03 DIAGNOSIS — Z833 Family history of diabetes mellitus: Secondary | ICD-10-CM

## 2016-12-03 DIAGNOSIS — O9962 Diseases of the digestive system complicating childbirth: Secondary | ICD-10-CM | POA: Diagnosis present

## 2016-12-03 DIAGNOSIS — Z8249 Family history of ischemic heart disease and other diseases of the circulatory system: Secondary | ICD-10-CM | POA: Diagnosis not present

## 2016-12-03 DIAGNOSIS — I10 Essential (primary) hypertension: Secondary | ICD-10-CM | POA: Diagnosis present

## 2016-12-03 DIAGNOSIS — Z87891 Personal history of nicotine dependence: Secondary | ICD-10-CM

## 2016-12-03 DIAGNOSIS — K219 Gastro-esophageal reflux disease without esophagitis: Secondary | ICD-10-CM | POA: Diagnosis present

## 2016-12-03 DIAGNOSIS — O99214 Obesity complicating childbirth: Secondary | ICD-10-CM | POA: Diagnosis present

## 2016-12-03 DIAGNOSIS — Z3A37 37 weeks gestation of pregnancy: Secondary | ICD-10-CM

## 2016-12-03 LAB — COMPREHENSIVE METABOLIC PANEL
ALT: 13 U/L — ABNORMAL LOW (ref 14–54)
ANION GAP: 9 (ref 5–15)
AST: 19 U/L (ref 15–41)
Albumin: 3.1 g/dL — ABNORMAL LOW (ref 3.5–5.0)
Alkaline Phosphatase: 71 U/L (ref 38–126)
BILIRUBIN TOTAL: 0.3 mg/dL (ref 0.3–1.2)
BUN: 9 mg/dL (ref 6–20)
CO2: 17 mmol/L — ABNORMAL LOW (ref 22–32)
Calcium: 8.9 mg/dL (ref 8.9–10.3)
Chloride: 108 mmol/L (ref 101–111)
Creatinine, Ser: 0.47 mg/dL (ref 0.44–1.00)
GFR calc Af Amer: >60 mL/min (ref >60)
Glucose, Bld: 103 mg/dL — ABNORMAL HIGH (ref 65–99)
POTASSIUM: 3.8 mmol/L (ref 3.5–5.1)
Sodium: 134 mmol/L — ABNORMAL LOW (ref 135–145)
TOTAL PROTEIN: 6.1 g/dL — AB (ref 6.5–8.1)

## 2016-12-03 LAB — CBC
HCT: 31 % — ABNORMAL LOW (ref 36.0–46.0)
Hemoglobin: 10.2 g/dL — ABNORMAL LOW (ref 12.0–15.0)
MCH: 27.6 pg (ref 26.0–34.0)
MCHC: 32.9 g/dL (ref 30.0–36.0)
MCV: 84 fL (ref 78.0–100.0)
PLATELETS: 157 10*3/uL (ref 150–400)
RBC: 3.69 MIL/uL — ABNORMAL LOW (ref 3.87–5.11)
RDW: 16.7 % — AB (ref 11.5–15.5)
WBC: 10.2 10*3/uL (ref 4.0–10.5)

## 2016-12-03 LAB — TYPE AND SCREEN
ABO/RH(D): A POS
Antibody Screen: NEGATIVE

## 2016-12-03 LAB — RPR: RPR: NONREACTIVE

## 2016-12-03 MED ORDER — DIPHENHYDRAMINE HCL 50 MG/ML IJ SOLN: 12.5000 mg | INTRAMUSCULAR | Status: DC | PRN

## 2016-12-03 MED ORDER — LIDOCAINE HCL (PF) 1 % IJ SOLN
INTRAMUSCULAR | Status: DC | PRN
  Administered 2016-12-03 (×2): 4 mL via EPIDURAL

## 2016-12-03 MED ORDER — LACTATED RINGERS IV SOLN: 500.0000 mL | INTRAVENOUS | Status: DC | PRN

## 2016-12-03 MED ORDER — OXYTOCIN BOLUS FROM INFUSION
500.0000 mL | Freq: Once | INTRAVENOUS | Status: AC
  Administered 2016-12-03: 500 mL via INTRAVENOUS

## 2016-12-03 MED ORDER — PHENYLEPHRINE 40 MCG/ML (10ML) SYRINGE FOR IV PUSH (FOR BLOOD PRESSURE SUPPORT)
80.0000 ug | PREFILLED_SYRINGE | INTRAVENOUS | Status: DC | PRN
  Filled 2016-12-03: qty 5

## 2016-12-03 MED ORDER — LACTATED RINGERS IV SOLN: 500.0000 mL | Freq: Once | INTRAVENOUS | Status: DC

## 2016-12-03 MED ORDER — PRENATAL MULTIVITAMIN CH
1.0000 | ORAL_TABLET | Freq: Every day | ORAL | Status: DC
  Administered 2016-12-04: 1 via ORAL
  Filled 2016-12-03: qty 1

## 2016-12-03 MED ORDER — MISOPROSTOL 25 MCG QUARTER TABLET
25.0000 ug | ORAL_TABLET | ORAL | Status: DC | PRN
  Filled 2016-12-03: qty 1

## 2016-12-03 MED ORDER — OXYCODONE-ACETAMINOPHEN 5-325 MG PO TABS: 1.0000 | ORAL_TABLET | ORAL | Status: DC | PRN

## 2016-12-03 MED ORDER — OXYTOCIN 40 UNITS IN LACTATED RINGERS INFUSION - SIMPLE MED
2.5000 [IU]/h | INTRAVENOUS | Status: DC
  Administered 2016-12-03: 2.5 [IU]/h via INTRAVENOUS
  Filled 2016-12-03: qty 1000

## 2016-12-03 MED ORDER — SENNOSIDES-DOCUSATE SODIUM 8.6-50 MG PO TABS
2.0000 | ORAL_TABLET | ORAL | Status: DC
  Administered 2016-12-04 (×2): 2 via ORAL
  Filled 2016-12-03 (×2): qty 2

## 2016-12-03 MED ORDER — DIPHENHYDRAMINE HCL 25 MG PO CAPS: 25.0000 mg | ORAL_CAPSULE | Freq: Four times a day (QID) | ORAL | Status: DC | PRN

## 2016-12-03 MED ORDER — IBUPROFEN 600 MG PO TABS
600.0000 mg | ORAL_TABLET | Freq: Four times a day (QID) | ORAL | Status: DC
  Administered 2016-12-03 – 2016-12-05 (×7): 600 mg via ORAL
  Filled 2016-12-03 (×7): qty 1

## 2016-12-03 MED ORDER — WITCH HAZEL-GLYCERIN EX PADS: 1.0000 "application " | MEDICATED_PAD | CUTANEOUS | Status: DC | PRN

## 2016-12-03 MED ORDER — BUTORPHANOL TARTRATE 1 MG/ML IJ SOLN
INTRAMUSCULAR | Status: AC
  Administered 2016-12-03: 1 mg
  Filled 2016-12-03: qty 1

## 2016-12-03 MED ORDER — ONDANSETRON HCL 4 MG/2ML IJ SOLN
4.0000 mg | Freq: Four times a day (QID) | INTRAMUSCULAR | Status: DC | PRN
  Administered 2016-12-03 (×2): 4 mg via INTRAVENOUS
  Filled 2016-12-03 (×2): qty 2

## 2016-12-03 MED ORDER — ACETAMINOPHEN 325 MG PO TABS
650.0000 mg | ORAL_TABLET | ORAL | Status: DC | PRN
  Filled 2016-12-03: qty 2

## 2016-12-03 MED ORDER — ONDANSETRON HCL 4 MG/2ML IJ SOLN
4.0000 mg | INTRAMUSCULAR | Status: DC | PRN
Start: 2016-12-03 — End: 2016-12-05

## 2016-12-03 MED ORDER — OXYCODONE-ACETAMINOPHEN 5-325 MG PO TABS: 2.0000 | ORAL_TABLET | ORAL | Status: DC | PRN

## 2016-12-03 MED ORDER — PHENYLEPHRINE 40 MCG/ML (10ML) SYRINGE FOR IV PUSH (FOR BLOOD PRESSURE SUPPORT)
80.0000 ug | PREFILLED_SYRINGE | INTRAVENOUS | Status: DC | PRN
  Filled 2016-12-03: qty 10
  Filled 2016-12-03: qty 5

## 2016-12-03 MED ORDER — OXYTOCIN 40 UNITS IN LACTATED RINGERS INFUSION - SIMPLE MED
1.0000 m[IU]/min | INTRAVENOUS | Status: DC
  Administered 2016-12-03: 2 m[IU]/min via INTRAVENOUS

## 2016-12-03 MED ORDER — ACETAMINOPHEN 325 MG PO TABS
650.0000 mg | ORAL_TABLET | ORAL | Status: DC | PRN
  Administered 2016-12-04: 650 mg via ORAL

## 2016-12-03 MED ORDER — COCONUT OIL OIL: 1.0000 "application " | TOPICAL_OIL | Status: DC | PRN

## 2016-12-03 MED ORDER — TETANUS-DIPHTH-ACELL PERTUSSIS 5-2.5-18.5 LF-MCG/0.5 IM SUSP: 0.5000 mL | Freq: Once | INTRAMUSCULAR | Status: DC

## 2016-12-03 MED ORDER — LIDOCAINE HCL (PF) 1 % IJ SOLN
30.0000 mL | INTRAMUSCULAR | Status: DC | PRN
  Filled 2016-12-03: qty 30

## 2016-12-03 MED ORDER — BENZOCAINE-MENTHOL 20-0.5 % EX AERO: 1.0000 "application " | INHALATION_SPRAY | CUTANEOUS | Status: DC | PRN

## 2016-12-03 MED ORDER — ONDANSETRON HCL 4 MG PO TABS: 4.0000 mg | ORAL_TABLET | ORAL | Status: DC | PRN

## 2016-12-03 MED ORDER — TERBUTALINE SULFATE 1 MG/ML IJ SOLN
0.2500 mg | Freq: Once | INTRAMUSCULAR | Status: DC | PRN
  Filled 2016-12-03: qty 1

## 2016-12-03 MED ORDER — SIMETHICONE 80 MG PO CHEW: 80.0000 mg | CHEWABLE_TABLET | ORAL | Status: DC | PRN

## 2016-12-03 MED ORDER — LACTATED RINGERS IV SOLN
INTRAVENOUS | Status: DC
  Administered 2016-12-03: 02:00:00 via INTRAVENOUS

## 2016-12-03 MED ORDER — ZOLPIDEM TARTRATE 5 MG PO TABS: 5.0000 mg | ORAL_TABLET | Freq: Every evening | ORAL | Status: DC | PRN

## 2016-12-03 MED ORDER — EPHEDRINE 5 MG/ML INJ
10.0000 mg | INTRAVENOUS | Status: DC | PRN
  Filled 2016-12-03: qty 2

## 2016-12-03 MED ORDER — OXYTOCIN 40 UNITS IN LACTATED RINGERS INFUSION - SIMPLE MED: 2.5000 [IU]/h | INTRAVENOUS | Status: DC | PRN

## 2016-12-03 MED ORDER — DIBUCAINE 1 % RE OINT: 1.0000 "application " | TOPICAL_OINTMENT | RECTAL | Status: DC | PRN

## 2016-12-03 MED ORDER — SOD CITRATE-CITRIC ACID 500-334 MG/5ML PO SOLN: 30.0000 mL | ORAL | Status: DC | PRN

## 2016-12-03 MED ORDER — FENTANYL 2.5 MCG/ML BUPIVACAINE 1/10 % EPIDURAL INFUSION (WH - ANES)
14.0000 mL/h | INTRAMUSCULAR | Status: DC | PRN
  Administered 2016-12-03: 13 mL/h via EPIDURAL
  Filled 2016-12-03: qty 100

## 2016-12-03 MED ORDER — BUTORPHANOL TARTRATE 1 MG/ML IJ SOLN
1.0000 mg | Freq: Once | INTRAMUSCULAR | Status: AC
  Administered 2016-12-03: 1 mg via INTRAVENOUS
  Filled 2016-12-03: qty 1

## 2016-12-03 NOTE — Anesthesia Preprocedure Evaluation (Signed)
Anesthesia Evaluation  Patient identified by MRN, date of birth, ID band Patient awake    Reviewed: Allergy & Precautions, NPO status , Patient's Chart, lab work & pertinent test results  Airway Mallampati: III  TM Distance: >3 FB Neck ROM: Full    Dental no notable dental hx. (+) Teeth Intact   Pulmonary former smoker,    Pulmonary exam normal breath sounds clear to auscultation       Cardiovascular hypertension, Normal cardiovascular exam Rhythm:Regular Rate:Normal     Neuro/Psych PSYCHIATRIC DISORDERS Bipolar Disorder negative neurological ROS     GI/Hepatic Neg liver ROS, GERD  Controlled and Medicated,  Endo/Other  Morbid obesity  Renal/GU Renal diseaseHx/o renal calculi  negative genitourinary   Musculoskeletal negative musculoskeletal ROS (+)   Abdominal (+) + obese,   Peds  Hematology  (+) anemia ,   Anesthesia Other Findings   Reproductive/Obstetrics                             Anesthesia Physical Anesthesia Plan  ASA: III  Anesthesia Plan: Epidural   Post-op Pain Management:    Induction:   Airway Management Planned: Natural Airway  Additional Equipment:   Intra-op Plan:   Post-operative Plan:   Informed Consent: I have reviewed the patients History and Physical, chart, labs and discussed the procedure including the risks, benefits and alternatives for the proposed anesthesia with the patient or authorized representative who has indicated his/her understanding and acceptance.   Dental advisory given  Plan Discussed with: Anesthesiologist  Anesthesia Plan Comments:         Anesthesia Quick Evaluation

## 2016-12-03 NOTE — Anesthesia Procedure Notes (Addendum)
Epidural Patient location during procedure: OB Start time: 12/03/2016 10:33 AM  Staffing Anesthesiologist: Mal Amabile Performed: anesthesiologist   Preanesthetic Checklist Completed: patient identified, site marked, surgical consent, pre-op evaluation, timeout performed, IV checked, risks and benefits discussed and monitors and equipment checked  Epidural Patient position: sitting Prep: site prepped and draped and DuraPrep Patient monitoring: continuous pulse ox and blood pressure Approach: midline Location: L3-L4 Injection technique: LOR air  Needle:  Needle type: Tuohy  Needle gauge: 17 G Needle length: 9 cm and 9 Needle insertion depth: 5 cm cm Catheter type: closed end flexible Catheter size: 19 Gauge Catheter at skin depth: 10 cm Test dose: negative and Other  Assessment Events: blood not aspirated, injection not painful, no injection resistance, negative IV test and no paresthesia  Additional Notes Patient identified. Risks and benefits discussed including failed block, incomplete  Pain control, post dural puncture headache, nerve damage, paralysis, blood pressure Changes, nausea, vomiting, reactions to medications-both toxic and allergic and post Partum back pain. All questions were answered. Patient expressed understanding and wished to proceed. Sterile technique was used throughout procedure. Epidural site was Dressed with sterile barrier dressing. No paresthesias, signs of intravascular injection Or signs of intrathecal spread were encountered.  Patient was more comfortable after the epidural was dosed. Please see RN's note for documentation of vital signs and FHR which are stable.

## 2016-12-03 NOTE — Anesthesia Pain Management Evaluation Note (Signed)
  CRNA Pain Management Visit Note  Patient: Jocelyn Waters, 36 y.o., female  "Hello I am a member of the anesthesia team at [facility]. We have an anesthesia team available at all times to provide care throughout the hospital, including epidural management and anesthesia for C-section. I don't know your plan for the delivery whether it a natural birth, water birth, IV sedation, nitrous supplementation, doula or epidural, but we want to meet your pain goals."   1.Was your pain managed to your expectations on prior hospitalizations?   Yes   2.What is your expectation for pain management during this hospitalization?     Epidural  3.How can we help you reach that goal?   Record the patient's initial score and the patient's pain goal.   Pain: 4  Pain Goal: 7 [facility] wants you to be able to say your pain was always managed very well.  Laban Emperor 12/03/2016

## 2016-12-03 NOTE — H&P (Signed)
Jocelyn Waters is a 36 y.o. female presenting for induction of labor at 37 weeks for h/o gestational hypertension.  Patient denies HA, no blurry vision no scotomata.  She notes contractions have started yesterday pm and have become more frequent. No vaginal bleeding,  No leaking of fluid, normal FM.   OB History    Gravida Para Term Preterm AB Living   0 1 1   SAB TAB Ectopic Multiple Live Births   0 1 0 0 1     Past Medical History:  Diagnosis Date  . ADD (attention deficit disorder)   . ASCUS (atypical squamous cells of undetermined significance) on Pap smear 2007  . Bipolar disorder (HCC)   . GERD (gastroesophageal reflux disease)    on Pepcid at home  . Hx of varicella   . Kidney stone    Passed stone January 2016  . Pregnancy induced hypertension    Gestational hypertension  . Reflux   . Vaginal Pap smear, abnormal    Past Surgical History:  Procedure Laterality Date  . INTRAUTERINE DEVICE INSERTION  05/04/2012   Mirena  . IUD REMOVAL  06/2013  . KNEE ARTHROSCOPY    . THERAPEUTIC ABORTION  05/2006   Family History: family history includes Diabetes in her maternal aunt and paternal grandfather; Hypertension in her maternal grandmother and paternal grandmother. Social History:  reports that she quit smoking about 5 years ago. She smoked 0.10 packs per day. She has never used smokeless tobacco. She reports that she does not drink alcohol or use drugs.     Maternal Diabetes: No Genetic Screening: Normal Maternal Ultrasounds/Referrals: Normal Fetal Ultrasounds or other Referrals:  Referred to Materal Fetal Medicine , Other: Anatomy scan normal Maternal Substance Abuse:  No Significant Maternal Medications:  None Significant Maternal Lab Results:  Lab values include: Group B Strep negative Other Comments:  None  ROS as per HPI History Dilation: 1 Exam by:: Aayra Hornbaker Blood pressure 137/79, pulse 97, temperature 98.5 F (36.9 C), temperature source Oral, resp. rate  18, height  (1.6 m), weight 103.4 kg (228 lb), last menstrual period 01/27/2016, currently breastfeeding. Maternal Exam:  Uterine Assessment: Contraction strength is moderate.  Contraction duration is 60 seconds. Contraction frequency is regular.   Abdomen: Patient reports no abdominal tenderness. Fundal height is 37 cm.   Fetal presentation: vertex  Introitus: Normal vulva. Normal vagina.  Ferning test: not done.  Nitrazine test: not done. Amniotic fluid character: not assessed.  Cervix: Cervix evaluated by digital exam.   1/60/-3  Fetal Exam Fetal Monitor Review: Baseline rate: 140.  Variability: moderate (6-25 bpm).   Pattern: no decelerations.       Physical Exam  Prenatal labs: ABO, Rh: A/Positive/-- (11/13 0000) Antibody: Negative (11/13 0000) Rubella: Immune (11/13 0000) RPR: Nonreactive (11/13 0000)  HBsAg: Negative (11/13 0000)  HIV: Non-reactive (11/13 0000)  GBS: Negative (04/17 0000)   Assessment/Plan: 36 yo G3P1 at 37 weeks with GHTN Admit to L&D Continuous monitoring  Patient contracting too frequently for misoprostol so will start Pitocin  Epidural on Demand Will check CMP with admit labs    Aden Sek STACIA 12/03/2016, 1:32 AM

## 2016-12-04 LAB — CBC
HCT: 30.4 % — ABNORMAL LOW (ref 36.0–46.0)
Hemoglobin: 10 g/dL — ABNORMAL LOW (ref 12.0–15.0)
MCH: 27.9 pg (ref 26.0–34.0)
MCHC: 32.9 g/dL (ref 30.0–36.0)
MCV: 84.7 fL (ref 78.0–100.0)
PLATELETS: 155 10*3/uL (ref 150–400)
RBC: 3.59 MIL/uL — ABNORMAL LOW (ref 3.87–5.11)
RDW: 17 % — AB (ref 11.5–15.5)
WBC: 10.7 10*3/uL — AB (ref 4.0–10.5)

## 2016-12-04 NOTE — Anesthesia Postprocedure Evaluation (Signed)
Anesthesia Post Note  Patient: Jocelyn Waters  Procedure(s) Performed: * No procedures listed *  Patient location during evaluation: Mother Baby Anesthesia Type: Epidural Level of consciousness: awake Pain management: pain level controlled Vital Signs Assessment: post-procedure vital signs reviewed and stable Respiratory status: spontaneous breathing Cardiovascular status: stable Postop Assessment: no headache, no backache, epidural receding and patient able to bend at knees Anesthetic complications: no        Last Vitals:  Vitals:   12/03/16 1830 12/03/16 2230  BP: 128/79 116/67  Pulse: 89 89  Resp: 18 18  Temp: 36.7 C 36.9 C    Last Pain:  Vitals:   12/04/16 0435  TempSrc:   PainSc: 0-No pain   Pain Goal: Patients Stated Pain Goal: 5 (12/03/16 0931)               Edison Pace

## 2016-12-04 NOTE — Lactation Note (Signed)
This note was copied from a baby's chart. Lactation Consultation Note Experienced BF mom BF her daughter for 17 months until she became pregnant with "[name]".  Mom stated she had to supplement w/her 1st child d/t LPI and needing larger supply of colostrum/milk. Mom has Medela DEBP. Mom stated it was slow at first the after a few weeks, then no issues. Mom stated she donated milk when she was BF her daughter d/t over supply.  Mom states this baby has BF well, but still a little to shallow. Mom thinks her nipples might be to big for the baby's mouth d/t baby has small mouth per mom. Assessed wide space "V" shaped everted nipples breast. Has colostrum. Mom states baby has some pinching at times. Would like the next latched assessed.  Reviewed feeding habits and cluster feeding. Set up DEBP d/t LPI.  Mom knows to pump q3h for 15-20 min. Mom has Medela at home. LPI information sheet given, reviewed STS, I&O, supply and demand. Discussed supplementing, prefer mom's colostrum LC encouraged to use first. Mom stated she shouldn't have trouble with that she hoped.  Mom encouraged to feed baby 8-12 times/24 hours and with feeding cues. Mom encouraged to waken baby for feeds. WH/LC brochure given w/resources, support groups and LC services. Patient Name: Jocelyn Waters [id number] Date: 12/04/2016 Reason for consult: Initial assessment   Maternal Data Has patient been taught Hand Expression?: Yes Does the patient have breastfeeding experience prior to this delivery?: Yes  Feeding Feeding Type: Breast Fed Length of feed: 10 min  LATCH Score/Interventions Latch: Repeated attempts needed to sustain latch, nipple held in mouth throughout feeding, stimulation needed to elicit sucking reflex.  Audible Swallowing: A few with stimulation Intervention(s): Skin to skin  Type of Nipple: Everted at rest and after stimulation  Comfort (Breast/Nipple): Soft / non-tender     Hold (Positioning): No assistance  needed to correctly position infant at breast.  LATCH Score: 8  Lactation Tools Discussed/Used Tools: Pump Breast pump type: Double-Electric Breast Pump Pump Review: Setup, frequency, and cleaning;Milk Storage Initiated by:: Peri Jefferson RN IBCLC Date initiated:: 12/04/16   Consult Status Consult Status: Follow-up Date: 12/04/16    Charyl Dancer 12/04/2016, 3:25 AM

## 2016-12-04 NOTE — Lactation Note (Signed)
This note was copied from a baby's chart. Lactation Consultation Note  Baby 17 hours old.  37 weeks.  Mother called to check latch. Baby latched in cross cradle upon entering.  Intermittent sucks and swallows observed. Mother complaining of some nipple tenderness.  Noted baby slipped down during feeding to a more shallow latch. Had mother unlatch baby and latch him deeper on breast. Reminded her to breastfeed on both breasts per feeding. Since he is 37 weeks, suggest mother post pump 4-6 times a day for 10-20 min and give baby back volume pumped. Discussed spoon feeding. Mother has personal nipple cream.  Discussed coconut oil and ebm.     Patient Name: Jocelyn Waters [id number] Date: 12/04/2016 Reason for consult: Follow-up assessment   Maternal Data    Feeding Feeding Type: Breast Fed  LATCH Score/Interventions Latch: Grasps breast easily, tongue down, lips flanged, rhythmical sucking. Intervention(s): Skin to skin;Waking techniques  Audible Swallowing: A few with stimulation Intervention(s): Skin to skin Intervention(s): Alternate breast massage  Type of Nipple: Everted at rest and after stimulation  Comfort (Breast/Nipple): Soft / non-tender     Hold (Positioning): No assistance needed to correctly position infant at breast.  LATCH Score: 9  Lactation Tools Discussed/Used Breast pump type: Double-Electric Breast Pump   Consult Status Consult Status: Follow-up Date: 12/05/16 Follow-up type: In-patient    Jocelyn Waters [facility] 12/04/2016, 9:38 AM

## 2016-12-04 NOTE — Progress Notes (Signed)
Patient is eating, ambulating, voiding.  Pain control is good.  Appropriate lochia, no complaints.  Vitals:   12/03/16 1730 12/03/16 1830 12/03/16 2230 12/04/16 0720  BP: 127/75 128/79 116/67 128/73  Pulse: 83 89 89 95  Resp: Temp: 98 F (36.7 C) 98.1 F (36.7 C) 98.4 F (36.9 C) 98.1 F (36.7 C)  TempSrc: Oral Oral    SpO2:    98%  Weight:      Height:        Fundus firm Perineum without swelling.  Lab Results  Component Value Date   WBC 10.7 (H) 12/04/2016   HGB 10.0 (L) 12/04/2016   HCT 30.4 (L) 12/04/2016   MCV 84.7 12/04/2016   PLT 155 12/04/2016    --/--/A POS (05/01 0120)  A/P Post partum day  1. Circ desired, consent obtained.  Routine care.  Expect d/c 5/3.    Jocelyn Waters

## 2016-12-05 MED ORDER — IBUPROFEN 600 MG PO TABS: 600.0000 mg | ORAL_TABLET | Freq: Four times a day (QID) | ORAL | 0 refills | Status: DC

## 2016-12-05 NOTE — Discharge Summary (Signed)
Obstetric Discharge Summary Reason for Admission: induction of labor for ghtn Prenatal Procedures: NST Intrapartum Procedures: spontaneous vaginal delivery Postpartum Procedures: none Complications-Operative and Postpartum: none Hemoglobin  Date Value Ref Range Status  12/04/2016 10.0 (L) 12.0 - 15.0 g/dL Final   HCT  Date Value Ref Range Status  12/04/2016 30.4 (L) 36.0 - 46.0 % Final    Physical Exam:  General: alert, cooperative and appears stated age 75Lochia: appropriate Uterine Fundus: firm DVT Evaluation: No evidence of DVT seen on physical exam.  Discharge Diagnoses: Term Pregnancy-delivered  Discharge Information: Date: 12/05/2016 Activity: pelvic rest Diet: routine Medications: PNV and Ibuprofen Condition: stable Instructions: refer to practice specific booklet Discharge to: home Follow-up Information    Jocelyn Waters, Jocelyn STACIA, MD Follow up on 12/09/2016.   Specialty:  Obstetrics and Gynecology Contact information: [address] [place] [address] [phone]           Newborn Data: Live born female  Birth Weight: 6 lb 10.4 oz (3016 g) APGAR: 8, 9  Home with mother.  Jocelyn Waters Jocelyn Waters 12/05/2016, 8:38 AM

## 2016-12-05 NOTE — Progress Notes (Signed)
Patient is doing well.  She is ambulating, voiding, tolerating PO.  Pain control is good.  Lochia is appropriate  Vitals:   12/03/16 2230 12/04/16 0720 12/04/16 1737 12/05/16 0503  BP: 116/67 128/73 129/86 136/73  Pulse: 89 95 92 81  Resp: 18 18 18 18   Temp: 98.4 F (36.9 C) 98.1 F (36.7 C) 98.7 F (37.1 C) 97.9 F (36.6 C)  TempSrc:   Oral Oral  SpO2:  98% 98%   Weight:      Height:        NAD Fundus firm Ext: 1+ edema b/l, symmetric  Lab Results  Component Value Date   WBC 10.7 (H) 12/04/2016   HGB 10.0 (L) 12/04/2016   HCT 30.4 (L) 12/04/2016   MCV 84.7 12/04/2016   PLT 155 12/04/2016    --/--/A POS (05/01 0120)/RImmune  A/P 36 y.o. [id number] PPD#2 s/p TSVD c/b GTHN. Routine care.  BPs stable.  Plan BP check in office in next 3-5d Expect d/c today.    [facility]AN[facility] GEFFEL [facility]

## 2016-12-12 ENCOUNTER — Ambulatory Visit: Payer: Self-pay

## 2016-12-12 NOTE — Lactation Note (Signed)
This note was copied from a baby's chart. Lactation Consult  Mother's reason for visit:  Weighted feeding, assistance with latching Visit Type:  Feeding assessment Appointment Notes:  See not below Consult:  Initial Lactation Consultant:  Ed Blalock  ________________________________________________________________________ Jocelyn Waters Name:  Jocelyn Waters Date of Birth:  12/03/2015 Pediatrician:  Alita Chyle Gender:  female Gestational Age: [redacted]w[redacted]d (At Birth) Birth Weight:  6 lb 10.4 oz (3016 g) Weight at Discharge: 6 lb 5.7 oz 2885 grams                         Date of Discharge:  12/05/16 There were no vitals filed for this visit. [facility]  6 lb 7.5 oz (with diaper)    Location:[facility] Weight today:  6 lb 8.5 oz (2962 grams) with diaper   ________________________________________________________________________  Mother's Name: Jocelyn Waters Type of delivery:  Vaginal, Spontaneous Delivery Breastfeeding Experience:  Breast fed my first for 17 months Maternal Medical Conditions:  Pregnancy induced hypertension Maternal Medications:  PNV, Ibuprofen, All Purpose Nipple ointment  ________________________________________________________________________  Breastfeeding History (Post Discharge)  Frequency of breastfeeding:  2-3 times a day Duration of feeding:  10-15 minutes  Supplementation   Breastmilk:  Volume 45-73ml Frequency:  2.5-3 hours Total volume per day:  360-447ml  Method:  Bottle   Mom generally does not offer supplement after BF if infant seems satisfied.   Pumping  Mom is pumping every 2-3 hours for 15-20 minutes with Medela PIS or Freestyle and obtaining 2-3 oz/ pumping. She has enough to feed infant and is beginning to be able to store milk for use later.   Infant Intake and Output Assessment  Voids:  6-8 in 24 hrs.  Color:  Clear yellow Stools:  3-4 in 24 hrs.  Color:   Yellow  ________________________________________________________________________  Maternal Breast Assessment  Breast:  Soft Nipple:  Erect Pain level:  2 Pain interventions:  All purpose nipple cream  _______________________________________________________________________ Feeding Assessment/Evaluation  Initial feeding assessment:  Infant's oral assessment:  Variance- infant with labial frenulum that inserts into the center of the gumline ridge, infant with short anterior lingual frenulum and can lick tongue out past lower gumline. Infant with blanching to upper lip with feedings.   Positioning:  Cross cradle Right breast  LATCH documentation:  Latch:  1 = Repeated attempts needed to sustain latch, nipple held in mouth throughout feeding, stimulation needed to elicit sucking reflex.  Audible swallowing:  2 = Spontaneous and intermittent  Type of nipple:  2 = Everted at rest and after stimulation  Comfort (Breast/Nipple):  2 = Soft / non-tender  Hold (Positioning):  2 = No assistance needed to correctly position infant at breast  LATCH score:  9  Attached assessment:  Deep  Lips flanged:  No.  Lips untucked:  Yes.    Suck assessment:  Displays both  Tools:  none Instructed on use and cleaning of tool:    Pre-feed weight:  2962 g  (6 lb. 8.5 oz.) Post-feed weight:  2988 g (6 lb. 9.4 oz.) Amount transferred:  26 ml Amount supplemented:  0 ml  Additional Feeding Assessment -   Infant's oral assessment:  Variance  Positioning:  Football Left breast  LATCH documentation:  Latch:  2 = Grasps breast easily, tongue down, lips flanged, rhythmical sucking.  Audible swallowing:  2 = Spontaneous and intermittent  Type of nipple:  2 = Everted at  rest and after stimulation  Comfort (Breast/Nipple):  1 = Filling, red/small blisters or bruises, mild/mod discomfort  Hold (Positioning):  2 = No assistance needed to correctly position infant at breast  LATCH score:  9  Attached  assessment:  Deep  Lips flanged:  No.  Lips untucked:  Yes.    Suck assessment:  Displays both  Tools:   Instructed on use and cleaning of tool:    Pre-feed weight:  2988 g  (6 lb. 9.4 oz.) Post-feed weight:  3014 g (6 lb. 10.3 oz.) Amount transferred:  26 ml Amount supplemented:  0 ml    Total amount transferred:  52 ml Total supplement given:  0 ml  Feeding assessment with mom and 9 day old infant. Mom reports she is putting infant to breast 2-3 x a day as she is concerned he is not getting enough. Mom reports infant is sleepy at the breast and will only take one breast at the time. She reports she is using awakening techniques and breast massage/compression with feedings.  Mom has been experiencing cracked and bleeding nipples and is using All Purpose Nipple Ointment with marked improvement over the last few days. Mom with large compressible breasts and areola with large everted nipples. Right nipple tissue intact without cracks or bruising noted. Left nipple with slight bruising noted to tip.   Mom reports infant is much more alert over the last few days. She was pleased he ate so well here in the office. Infant fed for about 25 minutes and transferred 52 ml of breast milk. Mom reports when infant goes to breast she usually does not offer supplement by bottle after unless he shows he wants it, she says he awakens sooner to feed after BF, enc her to put him to breast at feeding cues even if less than 3 hours.   Mom reports she has a follow up Ped appt the first week of June, she is going to have the [facility] come back out next week for another weight check. Mom reports her first child had a Tongue Tie and that she is aware Joelene MillinOliver also has one. She reports her nipples feel much better now and infant is latching on much better.    Plan with written copy to mom:  1. Offer breast 8-12 x in 24 hours at first feeding cues 2. Offer supplement of EBM via bottle after BF if  infant wants it 3. Continue to massage/compress breast with feeding 4. Continue pumping about every 3 hours to maintain supply until infant bigger and nursing more effectively 5. Offer infant bottle of breast milk when not BF. Increase volumes up to 60-90 cc.  6. Keep up the good work 7. Call for any questions/concerns.

## 2017-01-03 ENCOUNTER — Other Ambulatory Visit: Payer: Self-pay | Admitting: Obstetrics & Gynecology

## 2017-01-03 DIAGNOSIS — Z124 Encounter for screening for malignant neoplasm of cervix: Secondary | ICD-10-CM | POA: Diagnosis not present

## 2017-01-07 LAB — CYTOLOGY - PAP

## 2017-06-02 DIAGNOSIS — M79675 Pain in left toe(s): Secondary | ICD-10-CM | POA: Diagnosis not present

## 2017-06-02 DIAGNOSIS — L03031 Cellulitis of right toe: Secondary | ICD-10-CM | POA: Diagnosis not present

## 2017-06-02 DIAGNOSIS — L03032 Cellulitis of left toe: Secondary | ICD-10-CM | POA: Diagnosis not present

## 2017-06-02 DIAGNOSIS — M79674 Pain in right toe(s): Secondary | ICD-10-CM | POA: Diagnosis not present

## 2017-06-16 DIAGNOSIS — L03031 Cellulitis of right toe: Secondary | ICD-10-CM | POA: Diagnosis not present

## 2017-07-09 DIAGNOSIS — L03031 Cellulitis of right toe: Secondary | ICD-10-CM | POA: Diagnosis not present

## 2018-01-13 DIAGNOSIS — Z01419 Encounter for gynecological examination (general) (routine) without abnormal findings: Secondary | ICD-10-CM | POA: Diagnosis not present

## 2018-01-13 DIAGNOSIS — Z130 Encounter for screening for diseases of the blood and blood-forming organs and certain disorders involving the immune mechanism: Secondary | ICD-10-CM | POA: Diagnosis not present

## 2018-01-13 DIAGNOSIS — Z124 Encounter for screening for malignant neoplasm of cervix: Secondary | ICD-10-CM | POA: Diagnosis not present

## 2018-02-20 DIAGNOSIS — J029 Acute pharyngitis, unspecified: Secondary | ICD-10-CM | POA: Diagnosis not present

## 2018-02-20 DIAGNOSIS — R509 Fever, unspecified: Secondary | ICD-10-CM | POA: Diagnosis not present

## 2019-05-17 ENCOUNTER — Other Ambulatory Visit: Payer: Self-pay

## 2019-05-17 DIAGNOSIS — Z20822 Contact with and (suspected) exposure to covid-19: Secondary | ICD-10-CM

## 2019-05-18 LAB — NOVEL CORONAVIRUS, NAA: SARS-CoV-2, NAA: NOT DETECTED

## 2019-06-30 ENCOUNTER — Other Ambulatory Visit: Payer: Self-pay

## 2019-06-30 DIAGNOSIS — Z20822 Contact with and (suspected) exposure to covid-19: Secondary | ICD-10-CM

## 2019-07-01 LAB — NOVEL CORONAVIRUS, NAA: SARS-CoV-2, NAA: NOT DETECTED

## 2019-07-27 ENCOUNTER — Other Ambulatory Visit: Payer: Self-pay

## 2019-07-28 ENCOUNTER — Ambulatory Visit: Payer: 59 | Attending: Internal Medicine

## 2019-07-28 DIAGNOSIS — U071 COVID-19: Secondary | ICD-10-CM

## 2019-07-28 DIAGNOSIS — R238 Other skin changes: Secondary | ICD-10-CM

## 2019-07-29 LAB — NOVEL CORONAVIRUS, NAA: SARS-CoV-2, NAA: NOT DETECTED

## 2019-08-24 ENCOUNTER — Ambulatory Visit: Payer: 59 | Attending: Internal Medicine

## 2019-08-24 DIAGNOSIS — Z20822 Contact with and (suspected) exposure to covid-19: Secondary | ICD-10-CM

## 2019-08-25 LAB — NOVEL CORONAVIRUS, NAA: SARS-CoV-2, NAA: NOT DETECTED

## 2019-10-12 ENCOUNTER — Encounter (HOSPITAL_BASED_OUTPATIENT_CLINIC_OR_DEPARTMENT_OTHER): Payer: Self-pay | Admitting: Obstetrics & Gynecology

## 2019-10-12 ENCOUNTER — Inpatient Hospital Stay (HOSPITAL_COMMUNITY)
Admission: AD | Admit: 2019-10-12 | Discharge: 2019-10-12 | Disposition: A | Discharge status: Home / Self Care | Payer: 59 | Attending: Obstetrics & Gynecology | Admitting: Obstetrics & Gynecology

## 2019-10-12 ENCOUNTER — Other Ambulatory Visit: Payer: Self-pay | Admitting: Obstetrics & Gynecology

## 2019-10-12 ENCOUNTER — Other Ambulatory Visit: Payer: Self-pay

## 2019-10-12 DIAGNOSIS — Z01812 Encounter for preprocedural laboratory examination: Secondary | ICD-10-CM | POA: Insufficient documentation

## 2019-10-12 DIAGNOSIS — Z20822 Contact with and (suspected) exposure to covid-19: Secondary | ICD-10-CM | POA: Diagnosis not present

## 2019-10-12 NOTE — Progress Notes (Signed)
Spoke w/ via phone for pre-op interview--- PT Lab needs dos---- pre-orders pending              Lab results------ no COVID test ---- pt getting covid test done @ [facility] today , now currently Arrive at -------  0915 NPO after ------  MN Medications to take morning of surgery -----  NONE Diabetic medication ----- n/a Patient Special Instructions ----- asked pt to call [facility] front desk before entering facility, number given, to make sure her covid result has come back Pre-Op special Istructions ----- last add-on today, orders pending Patient verbalized understanding of instructions that were given at this phone interview. Patient denies shortness of breath, chest pain, fever, cough a this phone interview.

## 2019-10-12 NOTE — [facility] Note (Signed)
Pt for add on D&E tomorrow.  Here for pre-procedure covid swab only

## 2019-10-13 ENCOUNTER — Encounter (HOSPITAL_BASED_OUTPATIENT_CLINIC_OR_DEPARTMENT_OTHER): Payer: Self-pay | Admitting: Obstetrics & Gynecology

## 2019-10-13 ENCOUNTER — Other Ambulatory Visit: Payer: Self-pay

## 2019-10-13 ENCOUNTER — Ambulatory Visit (HOSPITAL_BASED_OUTPATIENT_CLINIC_OR_DEPARTMENT_OTHER): Payer: 59 | Admitting: Anesthesiology

## 2019-10-13 ENCOUNTER — Ambulatory Visit (HOSPITAL_BASED_OUTPATIENT_CLINIC_OR_DEPARTMENT_OTHER)
Admission: RE | Admit: 2019-10-13 | Discharge: 2019-10-13 | Disposition: A | Discharge status: Home / Self Care | Payer: 59 | Attending: Obstetrics & Gynecology | Admitting: Obstetrics & Gynecology

## 2019-10-13 ENCOUNTER — Encounter (HOSPITAL_BASED_OUTPATIENT_CLINIC_OR_DEPARTMENT_OTHER)
Admission: RE | Disposition: A | Discharge status: Home / Self Care | Payer: Self-pay | Source: Home / Self Care | Attending: Obstetrics & Gynecology

## 2019-10-13 DIAGNOSIS — Z3A09 9 weeks gestation of pregnancy: Secondary | ICD-10-CM | POA: Insufficient documentation

## 2019-10-13 DIAGNOSIS — O021 Missed abortion: Secondary | ICD-10-CM | POA: Insufficient documentation

## 2019-10-13 DIAGNOSIS — Z881 Allergy status to other antibiotic agents status: Secondary | ICD-10-CM | POA: Diagnosis not present

## 2019-10-13 DIAGNOSIS — F419 Anxiety disorder, unspecified: Secondary | ICD-10-CM | POA: Insufficient documentation

## 2019-10-13 DIAGNOSIS — Z833 Family history of diabetes mellitus: Secondary | ICD-10-CM | POA: Diagnosis not present

## 2019-10-13 DIAGNOSIS — F329 Major depressive disorder, single episode, unspecified: Secondary | ICD-10-CM | POA: Insufficient documentation

## 2019-10-13 DIAGNOSIS — Z8249 Family history of ischemic heart disease and other diseases of the circulatory system: Secondary | ICD-10-CM | POA: Diagnosis not present

## 2019-10-13 DIAGNOSIS — Z882 Allergy status to sulfonamides status: Secondary | ICD-10-CM | POA: Insufficient documentation

## 2019-10-13 DIAGNOSIS — Z87442 Personal history of urinary calculi: Secondary | ICD-10-CM | POA: Diagnosis not present

## 2019-10-13 DIAGNOSIS — F988 Other specified behavioral and emotional disorders with onset usually occurring in childhood and adolescence: Secondary | ICD-10-CM | POA: Insufficient documentation

## 2019-10-13 DIAGNOSIS — Z87891 Personal history of nicotine dependence: Secondary | ICD-10-CM | POA: Insufficient documentation

## 2019-10-13 HISTORY — DX: Depression, unspecified: F32.A

## 2019-10-13 HISTORY — DX: Nausea with vomiting, unspecified: R11.2

## 2019-10-13 HISTORY — DX: Anxiety disorder, unspecified: F41.9

## 2019-10-13 HISTORY — PX: DILATION AND EVACUATION: SHX1459

## 2019-10-13 HISTORY — DX: Personal history of other complications of pregnancy, childbirth and the puerperium: Z87.59

## 2019-10-13 HISTORY — DX: Other specified postprocedural states: Z98.890

## 2019-10-13 LAB — SARS CORONAVIRUS 2 (TAT 6-24 HRS): SARS Coronavirus 2: NEGATIVE

## 2019-10-13 SURGERY — DILATION AND EVACUATION, UTERUS
Anesthesia: General | Site: Vagina

## 2019-10-13 MED ORDER — DEXAMETHASONE SODIUM PHOSPHATE 10 MG/ML IJ SOLN
INTRAMUSCULAR | Status: AC
  Filled 2019-10-13: qty 1

## 2019-10-13 MED ORDER — FENTANYL CITRATE (PF) 100 MCG/2ML IJ SOLN
INTRAMUSCULAR | Status: AC
  Filled 2019-10-13: qty 2

## 2019-10-13 MED ORDER — SCOPOLAMINE 1 MG/3DAYS TD PT72
MEDICATED_PATCH | TRANSDERMAL | Status: AC
  Filled 2019-10-13: qty 1

## 2019-10-13 MED ORDER — KETOROLAC TROMETHAMINE 30 MG/ML IJ SOLN
INTRAMUSCULAR | Status: AC
  Filled 2019-10-13: qty 1

## 2019-10-13 MED ORDER — HYDROCODONE-ACETAMINOPHEN 5-325 MG PO TABS
ORAL_TABLET | ORAL | Status: AC
  Filled 2019-10-13: qty 1

## 2019-10-13 MED ORDER — LACTATED RINGERS IV SOLN
INTRAVENOUS | Status: DC
  Filled 2019-10-13: qty 1000

## 2019-10-13 MED ORDER — TRAMADOL HCL 50 MG PO TABS
50.0000 mg | ORAL_TABLET | Freq: Four times a day (QID) | ORAL | Status: DC | PRN
  Filled 2019-10-13: qty 1

## 2019-10-13 MED ORDER — PROPOFOL 10 MG/ML IV BOLUS
INTRAVENOUS | Status: DC | PRN
  Administered 2019-10-13: 200 mg via INTRAVENOUS

## 2019-10-13 MED ORDER — ACETAMINOPHEN 10 MG/ML IV SOLN
INTRAVENOUS | Status: DC | PRN
  Administered 2019-10-13: 1000 mg via INTRAVENOUS

## 2019-10-13 MED ORDER — LIDOCAINE 2% (20 MG/ML) 5 ML SYRINGE
INTRAMUSCULAR | Status: AC
  Filled 2019-10-13: qty 5

## 2019-10-13 MED ORDER — LIDOCAINE HCL 1 % IJ SOLN
INTRAMUSCULAR | Status: DC | PRN
  Administered 2019-10-13: 7 mL

## 2019-10-13 MED ORDER — SCOPOLAMINE 1 MG/3DAYS TD PT72
1.0000 | MEDICATED_PATCH | TRANSDERMAL | Status: DC
  Administered 2019-10-13: 10:00:00 1.5 mg via TRANSDERMAL
  Filled 2019-10-13: qty 1

## 2019-10-13 MED ORDER — DEXAMETHASONE SODIUM PHOSPHATE 4 MG/ML IJ SOLN
INTRAMUSCULAR | Status: DC | PRN
  Administered 2019-10-13: 10 mg via INTRAVENOUS

## 2019-10-13 MED ORDER — ONDANSETRON HCL 4 MG/2ML IJ SOLN
INTRAMUSCULAR | Status: DC | PRN
  Administered 2019-10-13: 8 mg via INTRAVENOUS

## 2019-10-13 MED ORDER — PROPOFOL 10 MG/ML IV BOLUS
INTRAVENOUS | Status: AC
  Filled 2019-10-13: qty 20

## 2019-10-13 MED ORDER — FENTANYL CITRATE (PF) 100 MCG/2ML IJ SOLN
INTRAMUSCULAR | Status: DC | PRN
  Administered 2019-10-13 (×4): 25 ug via INTRAVENOUS

## 2019-10-13 MED ORDER — ACETAMINOPHEN 10 MG/ML IV SOLN
INTRAVENOUS | Status: AC
  Filled 2019-10-13: qty 100

## 2019-10-13 MED ORDER — ACETAMINOPHEN 500 MG PO TABS
1000.0000 mg | ORAL_TABLET | Freq: Once | ORAL | Status: DC
  Filled 2019-10-13: qty 2

## 2019-10-13 MED ORDER — DOXYCYCLINE HYCLATE 100 MG PO CAPS: 100.0000 mg | ORAL_CAPSULE | Freq: Two times a day (BID) | ORAL | 0 refills | Status: AC

## 2019-10-13 MED ORDER — HYDROCODONE-ACETAMINOPHEN 5-325 MG PO TABS: 1.0000 | ORAL_TABLET | Freq: Four times a day (QID) | ORAL | 0 refills | Status: DC | PRN

## 2019-10-13 MED ORDER — SODIUM CHLORIDE 0.9 % IV SOLN
100.0000 mg | Freq: Once | INTRAVENOUS | Status: DC
  Filled 2019-10-13 (×2): qty 100

## 2019-10-13 MED ORDER — KETOROLAC TROMETHAMINE 30 MG/ML IJ SOLN
INTRAMUSCULAR | Status: DC | PRN
  Administered 2019-10-13: 30 mg via INTRAVENOUS

## 2019-10-13 MED ORDER — OXYCODONE HCL 5 MG PO TABS
5.0000 mg | ORAL_TABLET | ORAL | Status: DC | PRN
  Filled 2019-10-13: qty 2

## 2019-10-13 MED ORDER — KETOROLAC TROMETHAMINE 30 MG/ML IJ SOLN
30.0000 mg | Freq: Once | INTRAMUSCULAR | Status: DC
  Filled 2019-10-13: qty 1

## 2019-10-13 MED ORDER — PROPOFOL 500 MG/50ML IV EMUL
INTRAVENOUS | Status: DC | PRN
  Administered 2019-10-13: 150 ug/kg/min via INTRAVENOUS

## 2019-10-13 MED ORDER — ONDANSETRON HCL 4 MG/2ML IJ SOLN
4.0000 mg | Freq: Four times a day (QID) | INTRAMUSCULAR | Status: DC | PRN
  Filled 2019-10-13: qty 2

## 2019-10-13 MED ORDER — ONDANSETRON HCL 4 MG/2ML IJ SOLN
INTRAMUSCULAR | Status: AC
  Filled 2019-10-13: qty 2

## 2019-10-13 MED ORDER — ONDANSETRON HCL 4 MG PO TABS
4.0000 mg | ORAL_TABLET | Freq: Four times a day (QID) | ORAL | Status: DC | PRN
  Filled 2019-10-13: qty 1

## 2019-10-13 MED ORDER — MENTHOL 3 MG MT LOZG
1.0000 | LOZENGE | OROMUCOSAL | Status: DC | PRN
  Filled 2019-10-13: qty 9

## 2019-10-13 MED ORDER — IBUPROFEN 800 MG PO TABS: 800.0000 mg | ORAL_TABLET | Freq: Three times a day (TID) | ORAL | 0 refills | Status: DC | PRN

## 2019-10-13 MED ORDER — FENTANYL CITRATE (PF) 100 MCG/2ML IJ SOLN
25.0000 ug | INTRAMUSCULAR | Status: DC | PRN
  Administered 2019-10-13 (×2): 50 ug via INTRAVENOUS
  Filled 2019-10-13: qty 1

## 2019-10-13 MED ORDER — MIDAZOLAM HCL 2 MG/2ML IJ SOLN
INTRAMUSCULAR | Status: AC
  Filled 2019-10-13: qty 2

## 2019-10-13 MED ORDER — LIDOCAINE HCL (CARDIAC) PF 100 MG/5ML IV SOSY
PREFILLED_SYRINGE | INTRAVENOUS | Status: DC | PRN
  Administered 2019-10-13: 60 mg via INTRAVENOUS

## 2019-10-13 MED ORDER — HYDROMORPHONE HCL 1 MG/ML IJ SOLN
0.2000 mg | INTRAMUSCULAR | Status: DC | PRN
  Filled 2019-10-13: qty 1

## 2019-10-13 MED ORDER — HYDROCODONE-ACETAMINOPHEN 5-325 MG PO TABS
1.0000 | ORAL_TABLET | Freq: Once | ORAL | Status: AC
  Administered 2019-10-13: 1 via ORAL
  Filled 2019-10-13: qty 1

## 2019-10-13 MED ORDER — SILVER NITRATE-POT NITRATE 75-25 % EX MISC
CUTANEOUS | Status: DC | PRN
  Administered 2019-10-13: 4

## 2019-10-13 MED ORDER — MIDAZOLAM HCL 5 MG/5ML IJ SOLN
INTRAMUSCULAR | Status: DC | PRN
  Administered 2019-10-13: 2 mg via INTRAVENOUS

## 2019-10-13 SURGICAL SUPPLY — 26 items
CATH ROBINSON RED A/P 16FR (CATHETERS) ×3 IMPLANT
COVER WAND RF STERILE (DRAPES) ×3 IMPLANT
DILATOR CANAL MILEX (MISCELLANEOUS) IMPLANT
FILTER UTR ASPR ASSEMBLY (MISCELLANEOUS) ×3 IMPLANT
GLOVE BIO SURGEON STRL SZ 6.5 (GLOVE) ×3 IMPLANT
GLOVE BIO SURGEONS STRL SZ 6.5 (GLOVE) ×2
GLOVE BIOGEL PI IND STRL 6.5 (GLOVE) IMPLANT
GLOVE BIOGEL PI IND STRL 7.5 (GLOVE) IMPLANT
GLOVE BIOGEL PI INDICATOR 6.5 (GLOVE) ×2
GLOVE BIOGEL PI INDICATOR 7.5 (GLOVE) ×2
GOWN STRL REUS W/TWL LRG LVL3 (GOWN DISPOSABLE) ×3 IMPLANT
HOSE CONNECTING 18IN BERKELEY (TUBING) ×3 IMPLANT
KIT BERKELEY 1ST TRI 3/8 NO TR (MISCELLANEOUS) ×3 IMPLANT
KIT BERKELEY 1ST TRIMESTER 3/8 (MISCELLANEOUS) ×6 IMPLANT
KIT TURNOVER CYSTO (KITS) ×3 IMPLANT
NS IRRIG 500ML POUR BTL (IV SOLUTION) ×3 IMPLANT
PACK VAGINAL MINOR WOMEN LF (CUSTOM PROCEDURE TRAY) ×3 IMPLANT
PAD OB MATERNITY 4.3X12.25 (PERSONAL CARE ITEMS) ×3 IMPLANT
PAD PREP 24X48 CUFFED NSTRL (MISCELLANEOUS) ×3 IMPLANT
SET BERKELEY SUCTION TUBING (SUCTIONS) ×3 IMPLANT
TOWEL OR 17X26 10 PK STRL BLUE (TOWEL DISPOSABLE) ×6 IMPLANT
TRAP TISSUE FILTER (MISCELLANEOUS) ×6 IMPLANT
VACURETTE 10 RIGID CVD (CANNULA) IMPLANT
VACURETTE 7MM CVD STRL WRAP (CANNULA) IMPLANT
VACURETTE 8 RIGID CVD (CANNULA) ×2 IMPLANT
VACURETTE 9 RIGID CVD (CANNULA) IMPLANT

## 2019-10-13 NOTE — Discharge Instructions (Signed)
  Post Anesthesia Home Care Instructions  Activity: Get plenty of rest for the remainder of the day. A responsible individual must stay with you for 24 hours following the procedure.  For the next 24 hours, DO NOT: -Drive a car -Advertising copywriter -Drink alcoholic beverages -Take any medication unless instructed by your physician -Make any legal decisions or sign important papers.  Meals: Start with liquid foods such as gelatin or soup. Progress to regular foods as tolerated. Avoid greasy, spicy, heavy foods. If nausea and/or vomiting occur, drink only clear liquids until the nausea and/or vomiting subsides. Call your physician if vomiting continues.  Special Instructions/Symptoms: Your throat may feel dry or sore from the anesthesia or the breathing tube placed in your throat during surgery. If this causes discomfort, gargle with warm salt water. The discomfort should disappear within 24 hours.  If you had a scopolamine patch placed behind your ear for the management of post- operative nausea and/or vomiting:  1. The medication in the patch is effective for 72 hours, after which it should be removed.  Wrap patch in a tissue and discard in the trash. Wash hands thoroughly with soap and water. 2. You may remove the patch earlier than 72 hours if you experience unpleasant side effects which may include dry mouth, dizziness or visual disturbances. 3. Avoid touching the patch. Wash your hands with soap and water after contact with the patch.    Remove patch behind left ear Saturday, October 16, 2019.

## 2019-10-13 NOTE — Anesthesia Postprocedure Evaluation (Signed)
Anesthesia Post Note  Patient: Jocelyn Waters  Procedure(s) Performed: SUCTION  DILATATION AND EVACUATION WITH CHROMOSOME STUDIES (N/A Vagina )     Patient location during evaluation: PACU Anesthesia Type: General Level of consciousness: awake and alert Pain management: pain level controlled Vital Signs Assessment: post-procedure vital signs reviewed and stable Respiratory status: spontaneous breathing, nonlabored ventilation, respiratory function stable and patient connected to nasal cannula oxygen Cardiovascular status: blood pressure returned to baseline and stable Postop Assessment: no apparent nausea or vomiting Anesthetic complications: no    Last Vitals:  Vitals:   10/13/19 1200 10/13/19 1215  BP: 98/63 98/63  Pulse: 72 73  Resp: 10 11  Temp:    SpO2: 100% 99%    Last Pain:  Vitals:   10/13/19 1215  TempSrc:   PainSc: 3                  Jocelyn Waters

## 2019-10-13 NOTE — Op Note (Signed)
Operative Report  Jocelyn Waters female 39 y.o. @DATE @  Procedure:    * DILATATION AND EVACUATION - Choice  Preoperative Diagnosis:  9 week missed ab  Post operative Diagnosis:  same   Indications: The patient with fetal demise at 9 weeks CRL on measuring 7 weeks     Surgeon: [place]   Assistants: none  Anesthesia: General LMA anesthesia and Local anesthesia 1% buffered lidocaine  ASA Class: 2  Procedure Detail  DILATATION AND EVACUATION  Findings:  8 week size uterus to 6 size post procedure. Moderate amount of retained POC.  Good crie was achieved.  Estimated Blood Loss:  30 mL         Drains: straight cath prior to procedure - 30 mL urine         Total IV Fluids: 700 ml  Blood Given: none          Specimens: POC         Implants: none        Complications:  * No complications entered in OR log *         Technique:   Patient was placed in dorsal lithotomy position in Essie Hart. After adequate anesthesia was achieved, the patient was prepped and draped in the usual sterile fashion. The operative Graves speculum was placed in the vagina and the cervix stabilized with a single-tooth tenaculum.  A paracervical block using 1% lidocaine was performed. The cervix was dilated with Hank dilators and the 8 mm curette was used to remove contents of the uterus.  Alternating sharp curettage with a curette and suction curettage was performed until all contents were removed and good crie was achieved.  All instruments were removed from the vagina.  The patient tolerated the procedure well.    Disposition: PACU - hemodynamically stable.         Condition: stable  [employer]

## 2019-10-13 NOTE — Anesthesia Preprocedure Evaluation (Addendum)
Anesthesia Evaluation  Patient identified by MRN, date of birth, ID band Patient awake    Reviewed: Allergy & Precautions, NPO status , Patient's Chart, lab work & pertinent test results  History of Anesthesia Complications (+) PONV  Airway Mallampati: I  TM Distance: >3 FB Neck ROM: Full    Dental no notable dental hx. (+) Teeth Intact, Dental Advisory Given   Pulmonary neg pulmonary ROS, former smoker,    Pulmonary exam normal breath sounds clear to auscultation       Cardiovascular hypertension, Normal cardiovascular exam Rhythm:Regular Rate:Normal     Neuro/Psych PSYCHIATRIC DISORDERS Anxiety Depression Bipolar Disorder negative neurological ROS     GI/Hepatic Neg liver ROS, GERD  ,  Endo/Other  Morbid obesity  Renal/GU negative Renal ROS  negative genitourinary   Musculoskeletal negative musculoskeletal ROS (+)   Abdominal   Peds  (+) ATTENTION DEFICIT DISORDER WITHOUT HYPERACTIVITY Hematology negative hematology ROS (+)   Anesthesia Other Findings   Reproductive/Obstetrics                            Anesthesia Physical Anesthesia Plan  ASA: II  Anesthesia Plan: General   Post-op Pain Management:    Induction: Intravenous  PONV Risk Score and Plan: 4 or greater and Ondansetron, Dexamethasone, Midazolam, Scopolamine patch - Pre-op and TIVA  Airway Management Planned: LMA  Additional Equipment:   Intra-op Plan:   Post-operative Plan: Extubation in OR  Informed Consent: I have reviewed the patients History and Physical, chart, labs and discussed the procedure including the risks, benefits and alternatives for the proposed anesthesia with the patient or authorized representative who has indicated his/her understanding and acceptance.     Dental advisory given  Plan Discussed with: CRNA  Anesthesia Plan Comments:         Anesthesia Quick Evaluation

## 2019-10-13 NOTE — H&P (Signed)
Jocelyn Waters is an 39 y.o. female  [id number] with fetal demise at 7 weeks. Patient with spotting, no heavy bleeding, some cramping.  [place] in office yesterday shows fetus CRL 7 weeks no cardiac activity..   Pertinent Gynecological History: Menses: regular every 30 days without intermenstrual spotting Bleeding: normal Contraception: none DES exposure: denies Blood transfusions: none Sexually transmitted diseases: no past history Previous GYN Procedures: DNC  Last mammogram: n/a Date: n/a Last pap: normal Date: 2021 OB History: G4, P2   Menstrual History: Menarche age: 61 No LMP recorded. (Menstrual status: Other).    Past Medical History:  Diagnosis Date  . ADD (attention deficit disorder)   . Anxiety   . Depression   . History of abnormal cervical Pap smear 2007  . History of kidney stones 2016  . History of pregnancy induced hypertension   . Hx of varicella   . PONV (postoperative nausea and vomiting)     Past Surgical History:  Procedure Laterality Date  . KNEE ARTHROSCOPY Right 2000  . THERAPEUTIC ABORTION  05/2006  . WISDOM TOOTH EXTRACTION  [age]    Family History  Problem Relation Age of Onset  . Diabetes Maternal Aunt   . Hypertension Maternal Grandmother   . Hypertension Paternal Grandmother   . Diabetes Paternal Grandfather     Social History:  reports that she quit smoking about 7 years ago. She quit after 15.00 years of use. She has never used smokeless tobacco. She reports previous alcohol use. She reports that she does not use drugs.  Allergies:  Allergies  Allergen Reactions  . Biaxin [Clarithromycin] Rash  . Septra [Bactrim] Rash  . Sulfa Antibiotics Rash    Medications Prior to Admission  Medication Sig Dispense Refill Last Dose  . Doxylamine-Pyridoxine (DICLEGIS) 10-10 MG TBEC Take 2 tablets by mouth 2 (two) times daily as needed.   10/12/2019 at Unknown time  . Prenatal Vit-Fe Fumarate-FA (PRENATAL MULTIVITAMIN) TABS tablet Take 1 tablet by mouth  at bedtime.    Past Week at Unknown time    Review of Systems As per HPI  Blood pressure 117/72, pulse 85, temperature 97.8 F (36.6 C), temperature source Oral, resp. rate 14, height 5\' 3"  (1.6 m), weight 83.9 kg, SpO2 99 %, not currently breastfeeding. Physical Exam Deferred to OR  Results for orders placed or performed during the hospital encounter of 10/12/19 (from the past 24 hour(s))  SARS CORONAVIRUS 2 (TAT 6-24 HRS) Nasopharyngeal Nasopharyngeal Swab     Status: None   Collection Time: 10/12/19  5:28 PM   Specimen: Nasopharyngeal Swab  Result Value Ref Range   SARS Coronavirus 2 NEGATIVE NEGATIVE    No results found.  Assessment/Plan: 39 year old with 7 week missed ab On call to OR for suction D&E Risks of procedure explained to patient, she voiced understanding Consent signed, witnessed and placed into chart.    Jocelyn Waters 10/13/2019, 10:53 AM

## 2019-10-13 NOTE — Anesthesia Procedure Notes (Signed)
Procedure Name: LMA Insertion Date/Time: 10/13/2019 11:10 AM Performed by: Jessica Priest, CRNA Pre-anesthesia Checklist: Patient identified, Emergency Drugs available, Suction available and Patient being monitored Patient Re-evaluated:Patient Re-evaluated prior to induction Oxygen Delivery Method: Circle system utilized Preoxygenation: Pre-oxygenation with 100% oxygen Induction Type: IV induction Ventilation: Mask ventilation without difficulty LMA: LMA inserted LMA Size: 4.0 Number of attempts: 1 Airway Equipment and Method: Bite block Placement Confirmation: positive ETCO2,  CO2 detector and breath sounds checked- equal and bilateral Tube secured with: Tape Dental Injury: Teeth and Oropharynx as per pre-operative assessment

## 2019-10-13 NOTE — Transfer of Care (Signed)
Immediate Anesthesia Transfer of Care Note  Patient: Jocelyn Waters  Procedure(s) Performed: Procedure(s) (LRB): SUCTION  DILATATION AND EVACUATION WITH CHROMOSOME STUDIES (N/A)  Patient Location: PACU  Anesthesia Type: General  Level of Consciousness: awake, sedated, patient cooperative and responds to stimulation  Airway & Oxygen Therapy: Patient Spontanous Breathing and Patient connected to [place] 02 and soft FM   Post-op Assessment: Report given to PACU RN, Post -op Vital signs reviewed and stable and Patient moving all extremities  Post vital signs: Reviewed and stable  Complications: No apparent anesthesia complications

## 2019-10-14 LAB — SURGICAL PATHOLOGY

## 2019-11-18 ENCOUNTER — Other Ambulatory Visit: Payer: Self-pay | Admitting: Family Medicine

## 2019-11-18 DIAGNOSIS — R1011 Right upper quadrant pain: Secondary | ICD-10-CM

## 2019-11-19 ENCOUNTER — Ambulatory Visit
Admission: RE | Admit: 2019-11-19 | Discharge: 2019-11-19 | Disposition: A | Discharge status: Home / Self Care | Payer: 59 | Source: Ambulatory Visit | Attending: Family Medicine | Admitting: Family Medicine

## 2019-11-19 DIAGNOSIS — R1011 Right upper quadrant pain: Secondary | ICD-10-CM

## 2019-12-04 ENCOUNTER — Other Ambulatory Visit: Payer: Self-pay | Admitting: General Surgery

## 2019-12-10 ENCOUNTER — Other Ambulatory Visit: Payer: Self-pay

## 2019-12-10 ENCOUNTER — Encounter (HOSPITAL_BASED_OUTPATIENT_CLINIC_OR_DEPARTMENT_OTHER): Payer: Self-pay | Admitting: General Surgery

## 2019-12-13 ENCOUNTER — Other Ambulatory Visit (HOSPITAL_COMMUNITY)
Admission: RE | Admit: 2019-12-13 | Discharge: 2019-12-13 | Disposition: A | Discharge status: Home / Self Care | Payer: 59 | Source: Ambulatory Visit | Attending: General Surgery | Admitting: General Surgery

## 2019-12-13 DIAGNOSIS — Z01812 Encounter for preprocedural laboratory examination: Secondary | ICD-10-CM | POA: Diagnosis present

## 2019-12-13 DIAGNOSIS — Z20822 Contact with and (suspected) exposure to covid-19: Secondary | ICD-10-CM | POA: Diagnosis not present

## 2019-12-13 LAB — SARS CORONAVIRUS 2 (TAT 6-24 HRS): SARS Coronavirus 2: NEGATIVE

## 2019-12-13 NOTE — Progress Notes (Signed)

## 2019-12-16 ENCOUNTER — Encounter (HOSPITAL_BASED_OUTPATIENT_CLINIC_OR_DEPARTMENT_OTHER)
Admission: RE | Disposition: A | Discharge status: Home / Self Care | Payer: Self-pay | Source: Home / Self Care | Attending: General Surgery

## 2019-12-16 ENCOUNTER — Ambulatory Visit (HOSPITAL_BASED_OUTPATIENT_CLINIC_OR_DEPARTMENT_OTHER)
Admission: RE | Admit: 2019-12-16 | Discharge: 2019-12-16 | Disposition: A | Discharge status: Home / Self Care | Payer: 59 | Attending: General Surgery | Admitting: General Surgery

## 2019-12-16 ENCOUNTER — Encounter (HOSPITAL_BASED_OUTPATIENT_CLINIC_OR_DEPARTMENT_OTHER): Payer: Self-pay | Admitting: General Surgery

## 2019-12-16 ENCOUNTER — Other Ambulatory Visit: Payer: Self-pay

## 2019-12-16 ENCOUNTER — Ambulatory Visit (HOSPITAL_BASED_OUTPATIENT_CLINIC_OR_DEPARTMENT_OTHER): Payer: 59 | Admitting: Anesthesiology

## 2019-12-16 DIAGNOSIS — Z79899 Other long term (current) drug therapy: Secondary | ICD-10-CM | POA: Diagnosis not present

## 2019-12-16 DIAGNOSIS — Z87891 Personal history of nicotine dependence: Secondary | ICD-10-CM | POA: Diagnosis not present

## 2019-12-16 DIAGNOSIS — F329 Major depressive disorder, single episode, unspecified: Secondary | ICD-10-CM | POA: Diagnosis not present

## 2019-12-16 DIAGNOSIS — Z881 Allergy status to other antibiotic agents status: Secondary | ICD-10-CM | POA: Insufficient documentation

## 2019-12-16 DIAGNOSIS — Z882 Allergy status to sulfonamides status: Secondary | ICD-10-CM | POA: Insufficient documentation

## 2019-12-16 DIAGNOSIS — F419 Anxiety disorder, unspecified: Secondary | ICD-10-CM | POA: Diagnosis not present

## 2019-12-16 DIAGNOSIS — K8010 Calculus of gallbladder with chronic cholecystitis without obstruction: Secondary | ICD-10-CM | POA: Insufficient documentation

## 2019-12-16 DIAGNOSIS — K8020 Calculus of gallbladder without cholecystitis without obstruction: Secondary | ICD-10-CM | POA: Diagnosis present

## 2019-12-16 HISTORY — PX: CHOLECYSTECTOMY: SHX55

## 2019-12-16 LAB — POCT PREGNANCY, URINE: Preg Test, Ur: NEGATIVE

## 2019-12-16 SURGERY — LAPAROSCOPIC CHOLECYSTECTOMY
Anesthesia: Regional | Site: Abdomen

## 2019-12-16 MED ORDER — SCOPOLAMINE 1 MG/3DAYS TD PT72
MEDICATED_PATCH | TRANSDERMAL | Status: AC
  Filled 2019-12-16: qty 1

## 2019-12-16 MED ORDER — DIPHENHYDRAMINE HCL 50 MG/ML IJ SOLN
6.2500 mg | Freq: Once | INTRAMUSCULAR | Status: AC
  Administered 2019-12-16: 6.5 mg via INTRAVENOUS

## 2019-12-16 MED ORDER — LACTATED RINGERS IV SOLN: INTRAVENOUS | Status: DC

## 2019-12-16 MED ORDER — FENTANYL CITRATE (PF) 100 MCG/2ML IJ SOLN
INTRAMUSCULAR | Status: AC
  Filled 2019-12-16: qty 2

## 2019-12-16 MED ORDER — ROCURONIUM BROMIDE 100 MG/10ML IV SOLN
INTRAVENOUS | Status: DC | PRN
  Administered 2019-12-16: 60 mg via INTRAVENOUS

## 2019-12-16 MED ORDER — DEXAMETHASONE SODIUM PHOSPHATE 10 MG/ML IJ SOLN
INTRAMUSCULAR | Status: DC | PRN
  Administered 2019-12-16: 10 mg via INTRAVENOUS

## 2019-12-16 MED ORDER — MIDAZOLAM HCL 2 MG/2ML IJ SOLN
INTRAMUSCULAR | Status: AC
  Filled 2019-12-16: qty 2

## 2019-12-16 MED ORDER — LACTATED RINGERS IV SOLN: INTRAVENOUS | Status: DC | PRN

## 2019-12-16 MED ORDER — DIPHENHYDRAMINE HCL 50 MG/ML IJ SOLN
INTRAMUSCULAR | Status: AC
  Filled 2019-12-16: qty 1

## 2019-12-16 MED ORDER — KETOROLAC TROMETHAMINE 30 MG/ML IJ SOLN
INTRAMUSCULAR | Status: AC
  Filled 2019-12-16: qty 1

## 2019-12-16 MED ORDER — KETOROLAC TROMETHAMINE 15 MG/ML IJ SOLN: 15.0000 mg | INTRAMUSCULAR | Status: DC

## 2019-12-16 MED ORDER — KETOROLAC TROMETHAMINE 30 MG/ML IJ SOLN
INTRAMUSCULAR | Status: DC | PRN
Start: 2019-12-16 — End: 2019-12-16
  Administered 2019-12-16: 30 mg via INTRAVENOUS

## 2019-12-16 MED ORDER — ORAL CARE MOUTH RINSE: 15.0000 mL | Freq: Once | OROMUCOSAL | Status: DC

## 2019-12-16 MED ORDER — LIDOCAINE HCL (CARDIAC) PF 100 MG/5ML IV SOSY
PREFILLED_SYRINGE | INTRAVENOUS | Status: DC | PRN
  Administered 2019-12-16: 80 mg via INTRAVENOUS

## 2019-12-16 MED ORDER — DEXAMETHASONE SODIUM PHOSPHATE 10 MG/ML IJ SOLN
INTRAMUSCULAR | Status: DC | PRN
  Administered 2019-12-16 (×2): 5 mg

## 2019-12-16 MED ORDER — DEXAMETHASONE SODIUM PHOSPHATE 10 MG/ML IJ SOLN
INTRAMUSCULAR | Status: AC
  Filled 2019-12-16: qty 2

## 2019-12-16 MED ORDER — GABAPENTIN 100 MG PO CAPS
100.0000 mg | ORAL_CAPSULE | ORAL | Status: AC
  Administered 2019-12-16: 100 mg via ORAL

## 2019-12-16 MED ORDER — ROCURONIUM BROMIDE 10 MG/ML (PF) SYRINGE
PREFILLED_SYRINGE | INTRAVENOUS | Status: AC
  Filled 2019-12-16: qty 10

## 2019-12-16 MED ORDER — ACETAMINOPHEN 500 MG PO TABS
1000.0000 mg | ORAL_TABLET | ORAL | Status: AC
  Administered 2019-12-16: 1000 mg via ORAL

## 2019-12-16 MED ORDER — FENTANYL CITRATE (PF) 100 MCG/2ML IJ SOLN
25.0000 ug | INTRAMUSCULAR | Status: DC | PRN
  Administered 2019-12-16: 50 ug via INTRAVENOUS
  Administered 2019-12-16: 100 ug via INTRAVENOUS

## 2019-12-16 MED ORDER — LIDOCAINE 2% (20 MG/ML) 5 ML SYRINGE
INTRAMUSCULAR | Status: AC
  Filled 2019-12-16: qty 5

## 2019-12-16 MED ORDER — GABAPENTIN 100 MG PO CAPS
ORAL_CAPSULE | ORAL | Status: AC
  Filled 2019-12-16: qty 1

## 2019-12-16 MED ORDER — PROPOFOL 10 MG/ML IV BOLUS
INTRAVENOUS | Status: DC | PRN
Start: 2019-12-16 — End: 2019-12-16
  Administered 2019-12-16: 100 mg via INTRAVENOUS

## 2019-12-16 MED ORDER — ACETAMINOPHEN 500 MG PO TABS
ORAL_TABLET | ORAL | Status: AC
  Filled 2019-12-16: qty 2

## 2019-12-16 MED ORDER — ONDANSETRON HCL 4 MG/2ML IJ SOLN
INTRAMUSCULAR | Status: AC
  Filled 2019-12-16: qty 2

## 2019-12-16 MED ORDER — CEFAZOLIN SODIUM-DEXTROSE 2-4 GM/100ML-% IV SOLN
INTRAVENOUS | Status: AC
  Filled 2019-12-16: qty 100

## 2019-12-16 MED ORDER — ROPIVACAINE HCL 5 MG/ML IJ SOLN
INTRAMUSCULAR | Status: DC | PRN
  Administered 2019-12-16: 20 mL via PERINEURAL
  Administered 2019-12-16: 25 mL via PERINEURAL

## 2019-12-16 MED ORDER — MIDAZOLAM HCL 2 MG/2ML IJ SOLN
1.0000 mg | INTRAMUSCULAR | Status: DC | PRN
  Administered 2019-12-16: 2 mg via INTRAVENOUS

## 2019-12-16 MED ORDER — CHLORHEXIDINE GLUCONATE 0.12 % MT SOLN
15.0000 mL | Freq: Once | OROMUCOSAL | Status: DC
  Filled 2019-12-16: qty 15

## 2019-12-16 MED ORDER — PROPOFOL 10 MG/ML IV BOLUS
INTRAVENOUS | Status: AC
  Filled 2019-12-16: qty 20

## 2019-12-16 MED ORDER — OXYCODONE HCL 5 MG PO TABS: 5.0000 mg | ORAL_TABLET | Freq: Four times a day (QID) | ORAL | 0 refills | Status: DC | PRN

## 2019-12-16 MED ORDER — FENTANYL CITRATE (PF) 100 MCG/2ML IJ SOLN
INTRAMUSCULAR | Status: DC | PRN
  Administered 2019-12-16: 100 ug via INTRAVENOUS

## 2019-12-16 MED ORDER — CEFAZOLIN SODIUM-DEXTROSE 2-4 GM/100ML-% IV SOLN
2.0000 g | INTRAVENOUS | Status: AC
  Administered 2019-12-16: 2 g via INTRAVENOUS

## 2019-12-16 MED ORDER — FENTANYL CITRATE (PF) 100 MCG/2ML IJ SOLN
50.0000 ug | INTRAMUSCULAR | Status: DC | PRN
  Administered 2019-12-16 (×2): 50 ug via INTRAVENOUS

## 2019-12-16 MED ORDER — SODIUM CHLORIDE 0.9 % IR SOLN
Status: DC | PRN
  Administered 2019-12-16: 1000 mL

## 2019-12-16 MED ORDER — SUGAMMADEX SODIUM 200 MG/2ML IV SOLN
INTRAVENOUS | Status: DC | PRN
  Administered 2019-12-16 (×2): 200 mg via INTRAVENOUS

## 2019-12-16 MED ORDER — BUPIVACAINE HCL (PF) 0.25 % IJ SOLN
INTRAMUSCULAR | Status: DC | PRN
  Administered 2019-12-16: 11 mL

## 2019-12-16 MED ORDER — ONDANSETRON HCL 4 MG/2ML IJ SOLN
INTRAMUSCULAR | Status: DC | PRN
  Administered 2019-12-16 (×2): 4 mg via INTRAVENOUS

## 2019-12-16 MED ORDER — PROPOFOL 10 MG/ML IV BOLUS
INTRAVENOUS | Status: DC | PRN
  Administered 2019-12-16: 150 ug/kg/min via INTRAVENOUS

## 2019-12-16 MED ORDER — SCOPOLAMINE 1 MG/3DAYS TD PT72
1.0000 | MEDICATED_PATCH | TRANSDERMAL | Status: DC
  Administered 2019-12-16: 1.5 mg via TRANSDERMAL

## 2019-12-16 SURGICAL SUPPLY — 42 items
APPLIER CLIP 5 13 M/L LIGAMAX5 (MISCELLANEOUS) ×3
BLADE CLIPPER SURG (BLADE) IMPLANT
CANISTER SUCT 1200ML W/VALVE (MISCELLANEOUS) ×3 IMPLANT
CHLORAPREP W/TINT 26 (MISCELLANEOUS) ×3 IMPLANT
CLIP APPLIE 5 13 M/L LIGAMAX5 (MISCELLANEOUS) ×1 IMPLANT
CLOSURE WOUND 1/2 X4 (GAUZE/BANDAGES/DRESSINGS) ×1
COVER MAYO STAND STRL (DRAPES) IMPLANT
COVER WAND RF STERILE (DRAPES) IMPLANT
DECANTER SPIKE VIAL GLASS SM (MISCELLANEOUS) IMPLANT
DERMABOND ADVANCED (GAUZE/BANDAGES/DRESSINGS) ×2
DERMABOND ADVANCED .7 DNX12 (GAUZE/BANDAGES/DRESSINGS) ×1 IMPLANT
DEVICE TROCAR PUNCTURE CLOSURE (ENDOMECHANICALS) IMPLANT
DRAPE C-ARM 42X72 X-RAY (DRAPES) IMPLANT
DRAPE LAPAROSCOPIC ABDOMINAL (DRAPES) ×3 IMPLANT
ELECT REM PT RETURN 9FT ADLT (ELECTROSURGICAL) ×3
ELECTRODE REM PT RTRN 9FT ADLT (ELECTROSURGICAL) ×1 IMPLANT
GLOVE BIO SURGEON STRL SZ7 (GLOVE) ×3 IMPLANT
GLOVE BIOGEL PI IND STRL 7.5 (GLOVE) ×1 IMPLANT
GLOVE BIOGEL PI INDICATOR 7.5 (GLOVE) ×2
GOWN STRL REUS W/ TWL LRG LVL3 (GOWN DISPOSABLE) ×3 IMPLANT
GOWN STRL REUS W/TWL LRG LVL3 (GOWN DISPOSABLE) ×9
GRASPER SUT TROCAR 14GX15 (MISCELLANEOUS) ×2 IMPLANT
HEMOSTAT SNOW SURGICEL 2X4 (HEMOSTASIS) IMPLANT
NS IRRIG 1000ML POUR BTL (IV SOLUTION) ×3 IMPLANT
POUCH RETRIEVAL ECOSAC 10 (ENDOMECHANICALS) ×1 IMPLANT
POUCH RETRIEVAL ECOSAC 10MM (ENDOMECHANICALS) ×3
SCISSORS LAP 5X35 DISP (ENDOMECHANICALS) ×3 IMPLANT
SET BASIN DAY SURGERY F.S. (CUSTOM PROCEDURE TRAY) ×3 IMPLANT
SET CHOLANGIOGRAPH 5 50 .035 (SET/KITS/TRAYS/PACK) IMPLANT
SET IRRIG TUBING LAPAROSCOPIC (IRRIGATION / IRRIGATOR) ×3 IMPLANT
SET TUBE SMOKE EVAC HIGH FLOW (TUBING) ×3 IMPLANT
SLEEVE ENDOPATH XCEL 5M (ENDOMECHANICALS) ×6 IMPLANT
SLEEVE SCD COMPRESS KNEE MED (MISCELLANEOUS) ×3 IMPLANT
STRIP CLOSURE SKIN 1/2X4 (GAUZE/BANDAGES/DRESSINGS) ×2 IMPLANT
SUT MNCRL AB 4-0 PS2 18 (SUTURE) ×3 IMPLANT
SUT VICRYL 0 UR6 27IN ABS (SUTURE) ×2 IMPLANT
TOWEL GREEN STERILE FF (TOWEL DISPOSABLE) ×6 IMPLANT
TRAY LAPAROSCOPIC (CUSTOM PROCEDURE TRAY) ×3 IMPLANT
TROCAR XCEL BLUNT TIP 100MML (ENDOMECHANICALS) ×3 IMPLANT
TROCAR XCEL NON-BLD 5MMX100MML (ENDOMECHANICALS) ×3 IMPLANT
TUBE CONNECTING 20'X1/4 (TUBING) ×1
TUBE CONNECTING 20X1/4 (TUBING) ×2 IMPLANT

## 2019-12-16 NOTE — Progress Notes (Signed)
Assisted Dr. Woodrum with right, left, ultrasound guided, transabdominal plane block. Side rails up, monitors on throughout procedure. See vital signs in flow sheet. Tolerated Procedure well. 

## 2019-12-16 NOTE — Transfer of Care (Signed)
Immediate Anesthesia Transfer of Care Note  Patient: Jocelyn Waters  Procedure(s) Performed: LAPAROSCOPIC CHOLECYSTECTOMY (N/A Abdomen)  Patient Location: PACU  Anesthesia Type:General  Level of Consciousness: awake, alert  and oriented  Airway & Oxygen Therapy: Patient Spontanous Breathing and Patient connected to face mask oxygen  Post-op Assessment: Report given to RN and Post -op Vital signs reviewed and stable  Post vital signs: Reviewed and stable  Last Vitals:  Vitals Value Taken Time  BP 151/99 12/16/19 1330  Temp    Pulse 68 12/16/19 1336  Resp 11 12/16/19 1336  SpO2 100 % 12/16/19 1336  Vitals shown include unvalidated device data.  Last Pain:  Vitals:   12/16/19 1215  TempSrc:   PainSc: 0-No pain         Complications: No apparent anesthesia complications

## 2019-12-16 NOTE — Anesthesia Procedure Notes (Signed)
Anesthesia Regional Block: TAP block   Pre-Anesthetic Checklist: ,, timeout performed, Correct Patient, Correct Site, Correct Laterality, Correct Procedure, Correct Position, site marked, Risks and benefits discussed,  Surgical consent,  Pre-op evaluation,  At surgeon's request and post-op pain management  Laterality: Right  Prep: Maximum Sterile Barrier Precautions used, chloraprep       Needles:  Injection technique: Single-shot  Needle Type: Echogenic Stimulator Needle     Needle Length: 9cm  Needle Gauge: 22     Additional Needles:   Procedures:,,,, ultrasound used (permanent image in chart),,,,  Narrative:  Start time: 12/16/2019 12:05 PM End time: 12/16/2019 12:15 PM Injection made incrementally with aspirations every 5 mL.  Performed by: Personally  Anesthesiologist: Elmer Picker, MD  Additional Notes: Monitors applied. No increased pain on injection. No increased resistance to injection. Injection made in 5cc increments. Good needle visualization. Patient tolerated procedure well.

## 2019-12-16 NOTE — H&P (Signed)
39 yof referred by Horton Marshall for gallstones. 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 she has had since 2017 ruq pain after eating that has progressively worsened over that time. It occurs daily now. it is associated with nausea, no emesis. no change in bms. pain radiates to right side. in 2017 had negative [place]. due to continued symptoms she has undergone another [place] that shows multiple gallstones measuring up to 12 mm, nl cbd and no wall thickening noted. she is here to discuss possible cholecystectomy   Past Surgical History (April Staton, CMA; 12/02/2019 9:29 AM) Knee Surgery  Left. Oral Surgery   Diagnostic Studies History (April Joana Reamer, [place]; 12/02/2019 9:29 AM) Colonoscopy  never Mammogram  1-3 years ago Pap Smear  1-5 years ago  Allergies (April Staton, CMA; 12/02/2019 9:31 AM) Sulfa Antibiotics  Biaxin *MACROLIDES*   Medication History (April Staton, CMA; 12/02/2019 9:32 AM) ALPRAZolam (0.5MG  Tablet, Oral) Active. Flonase Allergy Relief (50MCG/ACT Suspension, Nasal) Active. ZyrTEC Allergy (10MG  Capsule, Oral) Active. Prenatal Vitamins (Oral) Active. Medications Reconciled  Social History (April Staton, 07-10-1977; 12/02/2019 9:29 AM) Alcohol use  Occasional alcohol use. Caffeine use  Coffee. Illicit drug use  Prefer to discuss with provider. Tobacco use  Former smoker.  Family History (April Staton, 02[birth year]19[birth year]2001; 12/02/2019 9:29 AM) Alcohol Abuse  Sister. Bleeding disorder  Mother. Breast Cancer  Family Members In General. Depression  Mother, Sister. Melanoma  Father, Mother. Migraine Headache  Mother.  Pregnancy / Birth History (April 07-10-1977, Joana Reamer; 12/02/2019 9:29 AM) Age at menarche  13 years. Contraceptive History  Depo-provera, Intrauterine device, Oral contraceptives. Gravida  5 Length (months) of breastfeeding  12-24 Maternal age  47-25 Para  2 Regular periods   Other Problems (April Staton, 02-19-2001; 12/02/2019 9:29 AM) Anxiety Disorder   Cholelithiasis  Depression  Gastroesophageal Reflux Disease  General anesthesia - complications  Kidney Stone     Review of Systems (April Staton CMA; 12/02/2019 9:29 AM) General Present- Appetite Loss. Not Present- Chills, Fatigue, Fever, Night Sweats, Weight Gain and Weight Loss. Skin Not Present- Change in Wart/Mole, Dryness, Hives, Jaundice, New Lesions, Non-Healing Wounds, Rash and Ulcer. HEENT Present- Seasonal Allergies. Not Present- Earache, Hearing Loss, Hoarseness, Nose Bleed, Oral Ulcers, Ringing in the Ears, Sinus Pain, Sore Throat, Visual Disturbances, Wears glasses/contact lenses and Yellow Eyes. Respiratory Not Present- Bloody sputum, Chronic Cough, Difficulty Breathing, Snoring and Wheezing. Breast Not Present- Breast Mass, Breast Pain, Nipple Discharge and Skin Changes. Cardiovascular Not Present- Chest Pain, Difficulty Breathing Lying Down, Leg Cramps, Palpitations, Rapid Heart Rate, Shortness of Breath and Swelling of Extremities. Gastrointestinal Present- Abdominal Pain, Change in Bowel Habits, Indigestion and Nausea. Not Present- Bloating, Bloody Stool, Chronic diarrhea, Constipation, Difficulty Swallowing, Excessive gas, Gets full quickly at meals, Hemorrhoids, Rectal Pain and Vomiting. Female Genitourinary Not Present- Frequency, Nocturia, Painful Urination, Pelvic Pain and Urgency. Musculoskeletal Present- Back Pain. Not Present- Joint Pain, Joint Stiffness, Muscle Pain, Muscle Weakness and Swelling of Extremities. Neurological Not Present- Decreased Memory, Fainting, Headaches, Numbness, Seizures, Tingling, Tremor, Trouble walking and Weakness. Psychiatric Present- Anxiety. Not Present- Bipolar, Change in Sleep Pattern, Depression, Fearful and Frequent crying. Endocrine Not Present- Cold Intolerance, Excessive Hunger, Hair Changes, Heat Intolerance, Hot flashes and New Diabetes. Hematology Not Present- Blood Thinners, Easy Bruising, Excessive bleeding, Gland  problems, HIV and Persistent Infections.  Vitals (April Staton CMA; 12/02/2019 9:33 AM) 12/02/2019 9:32 AM Weight: 156.25 lb Height: 63in Body Surface Area: 1.74 m Body Mass Index: 27.68 kg/m  Temp.: 98.56F (Tympanic)  Pulse: 88 (  Regular)  P.OX: 99% (Room air) BP: 110/82(Sitting, Left Arm, Standard)  Physical Exam Rolm Bookbinder MD; 12/02/2019 9:54 AM) General Mental Status-Alert. Orientation-Oriented X3. Eye Sclera/Conjunctiva - Bilateral-No scleral icterus. Abdomen Note: mild tender ruq, no murphys sign, soft, nondistended   Assessment & Plan Rolm Bookbinder MD; 12/02/2019 9:57 AM) GALLSTONES (K80.20) Story: Laparoscopic cholecystectomy I discussed the procedure in detail. We discussed the risks and benefits of a laparoscopic cholecystectomy and possible cholangiogram including, but not limited to bleeding, infection, injury to surrounding structures such as the intestine or liver, bile leak, retained gallstones, need to convert to an open procedure, prolonged diarrhea, blood clots such as DVT, common bile duct injury, anesthesia risks, and possible need for additional procedures. The likelihood of improvement in symptoms and return to the patient's normal status is good. We discussed the typical post-operative recovery course.

## 2019-12-16 NOTE — Discharge Instructions (Signed)
[facility] LAPAROSCOPIC SURGERY: POST OP INSTRUCTIONS  Always review your discharge instruction sheet given to you by the facility where your surgery was performed. IF YOU HAVE DISABILITY OR FAMILY LEAVE FORMS, YOU MUST BRING THEM TO THE OFFICE FOR PROCESSING.   DO NOT GIVE THEM TO YOUR DOCTOR.  1. A prescription for pain medication may be given to you upon discharge.  Take your pain medication as prescribed, if needed.  If narcotic pain medicine is not needed, then you may take acetaminophen (Tylenol), naprosyn (Alleve), or ibuprofen (Advil) as needed. 2. Take your usually prescribed medications unless otherwise directed. 3. If you need a refill on your pain medication, please contact your pharmacy.  They will contact our office to request authorization. Prescriptions will not be filled after 5pm or on week-ends. 4. You should follow a light diet the first few days after arrival home, such as soup and crackers, etc.  Be sure to include lots of fluids daily. 5. Most patients will experience some swelling and bruising in the area of the incisions.  Ice packs will help.  Swelling and bruising can take several days to resolve.  6. It is common to experience some constipation if taking pain medication after surgery.  Increasing fluid intake and taking a stool softener (such as Colace) will usually help or prevent this problem from occurring.  A mild laxative (Milk of Magnesia or Miralax) should be taken according to package instructions if there are no bowel movements after 48 hours. 7. Unless discharge instructions indicate otherwise, you may remove your bandages 48 hours after surgery, and you may shower at that time.  You may have steri-strips (small skin tapes) in place directly over the incision.  These strips should be left on the skin for 7-10 days.  If your surgeon used skin glue on the incision, you may shower in 24 hours.  The glue will flake  off over the next 2-3 weeks.  Any sutures or staples will be removed at the office during your follow-up visit. 8. ACTIVITIES:  You may resume regular (light) daily activities beginning the next day--such as daily self-care, walking, climbing stairs--gradually increasing activities as tolerated.  You may have sexual intercourse when it is comfortable.  Refrain from any heavy lifting or straining until approved by your doctor. a. You may drive when you are no longer taking prescription pain medication, you can comfortably wear a seatbelt, and you can safely maneuver your car and apply brakes. b. RETURN TO WORK:  __________________________________________________________ 9. You should see your doctor in the office for a follow-up appointment approximately 2-3 weeks after your surgery.  Make sure that you call for this appointment within a day or two after you arrive home to insure a convenient appointment time. 10. OTHER INSTRUCTIONS: __________________________________________________________________________________________________________________________ __________________________________________________________________________________________________________________________ WHEN TO CALL YOUR DOCTOR: 1. Fever over 101.0 2. Inability to urinate 3. Continued bleeding from incision. 4. Increased pain, redness, or drainage from the incision. 5. Increasing abdominal pain  The clinic staff is available to answer your questions during regular business hours.  Please don't hesitate to call and ask to speak to one of the nurses for clinical concerns.  If you have a medical emergency, go to the nearest emergency room or call 911.  A surgeon from [facility] is always on call at the hospital. [address], [address], [place], [place]  [address] ? [address] [address], [place], [place]   [address] [phone] ? [phone] ? [phone] [phone]   [phone] Web site: [email or web address]    Post  Anesthesia Home Care Instructions  Activity: Get plenty of rest for the remainder of the day. A responsible individual must stay with you for 24 hours following the procedure.  For the next 24 hours, DO NOT: -Drive a car -Advertising copywriter -Drink alcoholic beverages -Take any medication unless instructed by your physician -Make any legal decisions or sign important papers.  Meals: Start with liquid foods such as gelatin or soup. Progress to regular foods as tolerated. Avoid greasy, spicy, heavy foods. If nausea and/or vomiting occur, drink only clear liquids until the nausea and/or vomiting subsides. Call your physician if vomiting continues.  Special Instructions/Symptoms: Your throat may feel dry or sore from the anesthesia or the breathing tube placed in your throat during surgery. If this causes discomfort, gargle with warm salt water. The discomfort should disappear within 24 hours.  If you had a scopolamine patch placed behind your ear for the management of post- operative nausea and/or vomiting:  1. The medication in the patch is effective for 72 hours, after which it should be removed.  Wrap patch in a tissue and discard in the trash. Wash hands thoroughly with soap and water. 2. You may remove the patch earlier than 72 hours if you experience unpleasant side effects which may include dry mouth, dizziness or visual disturbances. 3. Avoid touching the patch. Wash your hands with soap and water after contact with the patch.  No tylenol or ibuprofen until after 4pm today.

## 2019-12-16 NOTE — Anesthesia Preprocedure Evaluation (Addendum)
Anesthesia Evaluation  Patient identified by MRN, date of birth, ID band Patient awake    Reviewed: Allergy & Precautions, NPO status , Patient's Chart, lab work & pertinent test results  History of Anesthesia Complications (+) PONV  Airway Mallampati: II  TM Distance: >3 FB Neck ROM: Full    Dental no notable dental hx. (+) Teeth Intact, Dental Advisory Given   Pulmonary neg pulmonary ROS, former smoker,    Pulmonary exam normal breath sounds clear to auscultation       Cardiovascular negative cardio ROS Normal cardiovascular exam Rhythm:Regular Rate:Normal     Neuro/Psych PSYCHIATRIC DISORDERS Anxiety Depression Bipolar Disorder negative neurological ROS     GI/Hepatic negative GI ROS, Neg liver ROS,   Endo/Other  negative endocrine ROS  Renal/GU negative Renal ROS  negative genitourinary   Musculoskeletal negative musculoskeletal ROS (+)   Abdominal   Peds  (+) ATTENTION DEFICIT DISORDER WITHOUT HYPERACTIVITY Hematology negative hematology ROS (+)   Anesthesia Other Findings   Reproductive/Obstetrics                            Anesthesia Physical Anesthesia Plan  ASA: II  Anesthesia Plan: General and Regional   Post-op Pain Management:  Regional for Post-op pain   Induction: Intravenous  PONV Risk Score and Plan: 4 or greater and Midazolam, Dexamethasone, Ondansetron, Scopolamine patch - Pre-op and TIVA  Airway Management Planned: Oral ETT  Additional Equipment:   Intra-op Plan:   Post-operative Plan: Extubation in OR  Informed Consent: I have reviewed the patients History and Physical, chart, labs and discussed the procedure including the risks, benefits and alternatives for the proposed anesthesia with the patient or authorized representative who has indicated his/her understanding and acceptance.     Dental advisory given  Plan Discussed with: CRNA  Anesthesia  Plan Comments:        Anesthesia Quick Evaluation

## 2019-12-16 NOTE — Op Note (Signed)
Preoperative diagnosis:Biliary colic Postoperative diagnosis: Same as above Procedure: Laparoscopic cholecystectomy Surgeon: Dr. Harden Mo Anesthesia: General  Estimated blood loss:10cc Complications: None Drains: None Specimens: Gallbladder and contents to pathology Sponge and count was correct at completion Disposition to recovery stable issue  Indications: 58 yof referred by Jocelyn Waters for gallstones. 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 she has had since 2017 ruq pain after eating that has progressively worsened over that time. It occurs daily now. it is associated with nausea, no emesis. no change in bms. pain radiates to right side. in 2017 had negative [place]. due to continued symptoms she has undergone another [place] that shows multiple gallstones measuring up to 12 mm, nl cbd and no wall thickening noted. We discussed lap chole.    Procedure: After informed consent was obtained the patientwas taken to the OR.She was then placed under general anesthesia. She underwent bilateral TAP blocks prior to beginning. She was prepped and draped in the standard sterile surgical fashion. Surgical timeout was then performed.  I made a vertical incision below her umbilicus after infiltrating local anesthetic. I then dissected to the fascia.I then incised the fascia and entered into the peritoneum bluntly. There was no evidence of an injury.I then placed 3 further 5 mm trocars in the epigastrium and right abdomen.I then retracted this cephalad and lateral. I dissected the triangle and there was mild inflammation I eventually was able to identify the critical view of safety. I then was able to identify the artery and clipped this 3 times. I divided these leaving 2 clips in place.I was able to identify the common bile duct and cystic duct,.I then treated the cystic duct in a similar fashion. I divided the duct leaving two clips in place. The duct was viable and the  clips completely traversed the duct. I then placed the gallbladder in a retrieval bag and removed it from the umbilicus. Hemostasis was then observed. I then removed my umbilical trocar and tied my pursestring down.  I then placed an additional 0 Vicryl suture to completely obliterate this defect.The remaining trocars were then removed and the abdomen was desufflated. I closed these with 4-0 Monocryl and glue. She tolerated this well was extubated and transferred to the recovery room in stable condition.

## 2019-12-16 NOTE — Anesthesia Procedure Notes (Signed)
Anesthesia Regional Block: TAP block   Pre-Anesthetic Checklist: ,, timeout performed, Correct Patient, Correct Site, Correct Laterality, Correct Procedure, Correct Position, site marked, Risks and benefits discussed,  Surgical consent,  Pre-op evaluation,  At surgeon's request and post-op pain management  Laterality: Left  Prep: Maximum Sterile Barrier Precautions used, chloraprep       Needles:  Injection technique: Single-shot  Needle Type: Echogenic Stimulator Needle     Needle Length: 9cm  Needle Gauge: 22     Additional Needles:   Procedures:,,,, ultrasound used (permanent image in chart),,,,  Narrative:  Start time: 12/16/2019 11:54 AM End time: 12/16/2019 12:04 PM Injection made incrementally with aspirations every 5 mL.  Performed by: Personally  Anesthesiologist: Elmer Picker, MD  Additional Notes: Monitors applied. No increased pain on injection. No increased resistance to injection. Injection made in 5cc increments. Good needle visualization. Patient tolerated procedure well.

## 2019-12-16 NOTE — Anesthesia Procedure Notes (Signed)
Procedure Name: Intubation Date/Time: 12/16/2019 12:33 PM Performed by: [place] Landen Knoedler C, CRNA Pre-anesthesia Checklist: Patient identified, Emergency Drugs available, Suction available and Patient being monitored Patient Re-evaluated:Patient Re-evaluated prior to induction Oxygen Delivery Method: Circle system utilized Preoxygenation: Pre-oxygenation with 100% oxygen Induction Type: IV induction Ventilation: Mask ventilation without difficulty Laryngoscope Size: Mac and 3 Grade View: Grade I Tube type: Oral Number of attempts: 1 Airway Equipment and Method: Stylet and Oral airway Placement Confirmation: ETT inserted through vocal cords under direct vision,  positive ETCO2 and breath sounds checked- equal and bilateral Secured at: 21 cm Tube secured with: Tape Dental Injury: Teeth and Oropharynx as per pre-operative assessment

## 2019-12-16 NOTE — Interval H&P Note (Signed)
History and Physical Interval Note:  12/16/2019 12:10 PM  Jocelyn Waters  has presented today for surgery, with the diagnosis of GALLSTONES.  The various methods of treatment have been discussed with the patient and family. After consideration of risks, benefits and other options for treatment, the patient has consented to  Procedure(s) with comments: LAPAROSCOPIC CHOLECYSTECTOMY (N/A) - GENERAL WITH BILATERAL TAP BLOCK as a surgical intervention.  The patient's history has been reviewed, patient examined, no change in status, stable for surgery.  I have reviewed the patient's chart and labs.  Questions were answered to the patient's satisfaction.     [name]

## 2019-12-17 ENCOUNTER — Encounter: Payer: Self-pay | Admitting: *Deleted

## 2019-12-17 LAB — SURGICAL PATHOLOGY

## 2019-12-17 NOTE — Addendum Note (Signed)
Addendum  created 12/17/19 1245 by Lance Coon, CRNA   Charge Capture section accepted

## 2019-12-17 NOTE — Anesthesia Postprocedure Evaluation (Signed)
Anesthesia Post Note  Patient: Jocelyn Waters  Procedure(s) Performed: LAPAROSCOPIC CHOLECYSTECTOMY (N/A Abdomen)     Patient location during evaluation: PACU Anesthesia Type: Regional and General Level of consciousness: awake and alert Pain management: pain level controlled Vital Signs Assessment: post-procedure vital signs reviewed and stable Respiratory status: spontaneous breathing, nonlabored ventilation, respiratory function stable and patient connected to nasal cannula oxygen Cardiovascular status: blood pressure returned to baseline and stable Postop Assessment: no apparent nausea or vomiting Anesthetic complications: no    Last Vitals:  Vitals:   12/16/19 1500 12/16/19 1514  BP: 110/81 132/78  Pulse: 64 77  Resp: 12 15  Temp:  36.5 C  SpO2: 100% 100%    Last Pain:  Vitals:   12/16/19 1514  TempSrc: Oral  PainSc: 4                  Chelsey L Woodrum

## 2020-02-10 ENCOUNTER — Emergency Department (HOSPITAL_COMMUNITY): Payer: 59 | Admitting: Anesthesiology

## 2020-02-10 ENCOUNTER — Other Ambulatory Visit: Payer: Self-pay

## 2020-02-10 ENCOUNTER — Emergency Department (HOSPITAL_COMMUNITY): Payer: 59

## 2020-02-10 ENCOUNTER — Ambulatory Visit (HOSPITAL_COMMUNITY)
Admission: EM | Admit: 2020-02-10 | Discharge: 2020-02-11 | Disposition: A | Discharge status: Home / Self Care | Payer: 59 | Attending: Surgery | Admitting: Surgery

## 2020-02-10 ENCOUNTER — Encounter (HOSPITAL_COMMUNITY): Payer: Self-pay | Admitting: Emergency Medicine

## 2020-02-10 ENCOUNTER — Encounter (HOSPITAL_COMMUNITY)
Admission: EM | Disposition: A | Discharge status: Home / Self Care | Payer: Self-pay | Source: Home / Self Care | Attending: Emergency Medicine

## 2020-02-10 DIAGNOSIS — Z20822 Contact with and (suspected) exposure to covid-19: Secondary | ICD-10-CM | POA: Diagnosis not present

## 2020-02-10 DIAGNOSIS — Z833 Family history of diabetes mellitus: Secondary | ICD-10-CM | POA: Diagnosis not present

## 2020-02-10 DIAGNOSIS — I10 Essential (primary) hypertension: Secondary | ICD-10-CM | POA: Insufficient documentation

## 2020-02-10 DIAGNOSIS — F419 Anxiety disorder, unspecified: Secondary | ICD-10-CM | POA: Insufficient documentation

## 2020-02-10 DIAGNOSIS — Z79899 Other long term (current) drug therapy: Secondary | ICD-10-CM | POA: Diagnosis not present

## 2020-02-10 DIAGNOSIS — F319 Bipolar disorder, unspecified: Secondary | ICD-10-CM | POA: Diagnosis not present

## 2020-02-10 DIAGNOSIS — K381 Appendicular concretions: Secondary | ICD-10-CM | POA: Diagnosis not present

## 2020-02-10 DIAGNOSIS — K3580 Unspecified acute appendicitis: Secondary | ICD-10-CM | POA: Diagnosis present

## 2020-02-10 DIAGNOSIS — Z881 Allergy status to other antibiotic agents status: Secondary | ICD-10-CM | POA: Insufficient documentation

## 2020-02-10 DIAGNOSIS — N200 Calculus of kidney: Secondary | ICD-10-CM | POA: Diagnosis not present

## 2020-02-10 DIAGNOSIS — Z882 Allergy status to sulfonamides status: Secondary | ICD-10-CM | POA: Diagnosis not present

## 2020-02-10 DIAGNOSIS — Z87891 Personal history of nicotine dependence: Secondary | ICD-10-CM | POA: Diagnosis not present

## 2020-02-10 DIAGNOSIS — K3589 Other acute appendicitis without perforation or gangrene: Secondary | ICD-10-CM | POA: Diagnosis not present

## 2020-02-10 DIAGNOSIS — Z8249 Family history of ischemic heart disease and other diseases of the circulatory system: Secondary | ICD-10-CM | POA: Diagnosis not present

## 2020-02-10 DIAGNOSIS — Z87442 Personal history of urinary calculi: Secondary | ICD-10-CM | POA: Insufficient documentation

## 2020-02-10 DIAGNOSIS — Z9049 Acquired absence of other specified parts of digestive tract: Secondary | ICD-10-CM | POA: Diagnosis not present

## 2020-02-10 DIAGNOSIS — F988 Other specified behavioral and emotional disorders with onset usually occurring in childhood and adolescence: Secondary | ICD-10-CM | POA: Diagnosis not present

## 2020-02-10 DIAGNOSIS — K219 Gastro-esophageal reflux disease without esophagitis: Secondary | ICD-10-CM | POA: Diagnosis not present

## 2020-02-10 HISTORY — PX: LAPAROSCOPIC APPENDECTOMY: SHX408

## 2020-02-10 LAB — I-STAT BETA HCG BLOOD, ED (MC, WL, AP ONLY): I-stat hCG, quantitative: <5 m[IU]/mL (ref <5)

## 2020-02-10 LAB — CBC
HCT: 39.5 % (ref 36.0–46.0)
Hemoglobin: 13.5 g/dL (ref 12.0–15.0)
MCH: 30.4 pg (ref 26.0–34.0)
MCHC: 34.2 g/dL (ref 30.0–36.0)
MCV: 89 fL (ref 80.0–100.0)
Platelets: 189 10*3/uL (ref 150–400)
RBC: 4.44 MIL/uL (ref 3.87–5.11)
RDW: 12.2 % (ref 11.5–15.5)
WBC: 15.4 10*3/uL — ABNORMAL HIGH (ref 4.0–10.5)
nRBC: 0 % (ref 0.0–0.2)

## 2020-02-10 LAB — URINALYSIS, ROUTINE W REFLEX MICROSCOPIC
Bacteria, UA: NONE SEEN
Bilirubin Urine: NEGATIVE
Glucose, UA: NEGATIVE mg/dL
Ketones, ur: 20 mg/dL — AB
Leukocytes,Ua: NEGATIVE
Nitrite: NEGATIVE
Protein, ur: NEGATIVE mg/dL
Specific Gravity, Urine: 1.002 — ABNORMAL LOW (ref 1.005–1.030)
pH: 7 (ref 5.0–8.0)

## 2020-02-10 LAB — SARS CORONAVIRUS 2 BY RT PCR (HOSPITAL ORDER, PERFORMED IN ~~LOC~~ HOSPITAL LAB): SARS Coronavirus 2: NEGATIVE

## 2020-02-10 LAB — COMPREHENSIVE METABOLIC PANEL
ALT: 14 U/L (ref 0–44)
AST: 18 U/L (ref 15–41)
Albumin: 4.9 g/dL (ref 3.5–5.0)
Alkaline Phosphatase: 57 U/L (ref 38–126)
Anion gap: 8 (ref 5–15)
BUN: 11 mg/dL (ref 6–20)
CO2: 24 mmol/L (ref 22–32)
Calcium: 9 mg/dL (ref 8.9–10.3)
Chloride: 100 mmol/L (ref 98–111)
Creatinine, Ser: 0.6 mg/dL (ref 0.44–1.00)
GFR calc Af Amer: >60 mL/min (ref >60)
GFR calc non Af Amer: >60 mL/min (ref >60)
Glucose, Bld: 103 mg/dL — ABNORMAL HIGH (ref 70–99)
Potassium: 4.5 mmol/L (ref 3.5–5.1)
Sodium: 132 mmol/L — ABNORMAL LOW (ref 135–145)
Total Bilirubin: 0.9 mg/dL (ref 0.3–1.2)
Total Protein: 7.7 g/dL (ref 6.5–8.1)

## 2020-02-10 LAB — LIPASE, BLOOD: Lipase: 21 U/L (ref 11–51)

## 2020-02-10 SURGERY — APPENDECTOMY, LAPAROSCOPIC
Anesthesia: General | Site: Abdomen

## 2020-02-10 MED ORDER — BUPIVACAINE-EPINEPHRINE 0.25% -1:200000 IJ SOLN
INTRAMUSCULAR | Status: DC | PRN
  Administered 2020-02-10: 20 mL

## 2020-02-10 MED ORDER — LACTATED RINGERS IV SOLN
INTRAVENOUS | Status: AC | PRN
  Administered 2020-02-10: 1000 mL

## 2020-02-10 MED ORDER — PROPOFOL 500 MG/50ML IV EMUL
INTRAVENOUS | Status: DC | PRN
  Administered 2020-02-10: 150 ug/kg/min via INTRAVENOUS

## 2020-02-10 MED ORDER — LIDOCAINE 2% (20 MG/ML) 5 ML SYRINGE
INTRAMUSCULAR | Status: DC | PRN
  Administered 2020-02-10: 60 mg via INTRAVENOUS

## 2020-02-10 MED ORDER — TRAMADOL HCL 50 MG PO TABS
50.0000 mg | ORAL_TABLET | Freq: Four times a day (QID) | ORAL | Status: DC | PRN
  Administered 2020-02-11: 50 mg via ORAL
  Filled 2020-02-10: qty 1

## 2020-02-10 MED ORDER — ONDANSETRON 4 MG PO TBDP: 4.0000 mg | ORAL_TABLET | Freq: Four times a day (QID) | ORAL | Status: DC | PRN

## 2020-02-10 MED ORDER — PROPOFOL 10 MG/ML IV BOLUS
INTRAVENOUS | Status: AC
  Filled 2020-02-10: qty 20

## 2020-02-10 MED ORDER — OXYCODONE HCL 5 MG PO TABS
5.0000 mg | ORAL_TABLET | ORAL | Status: DC | PRN
  Administered 2020-02-11: 10 mg via ORAL
  Administered 2020-02-11: 5 mg via ORAL
  Filled 2020-02-10: qty 1
  Filled 2020-02-10: qty 2

## 2020-02-10 MED ORDER — SCOPOLAMINE 1 MG/3DAYS TD PT72
MEDICATED_PATCH | TRANSDERMAL | Status: AC
  Filled 2020-02-10: qty 1

## 2020-02-10 MED ORDER — FENTANYL CITRATE (PF) 100 MCG/2ML IJ SOLN
50.0000 ug | Freq: Once | INTRAMUSCULAR | Status: AC
  Administered 2020-02-10: 50 ug via INTRAVENOUS
  Filled 2020-02-10: qty 2

## 2020-02-10 MED ORDER — ACETAMINOPHEN 10 MG/ML IV SOLN
1000.0000 mg | Freq: Once | INTRAVENOUS | Status: DC | PRN
  Administered 2020-02-10: 1000 mg via INTRAVENOUS

## 2020-02-10 MED ORDER — SCOPOLAMINE 1 MG/3DAYS TD PT72
MEDICATED_PATCH | TRANSDERMAL | Status: DC | PRN
  Administered 2020-02-10: 1 via TRANSDERMAL

## 2020-02-10 MED ORDER — PROMETHAZINE HCL 25 MG/ML IJ SOLN
6.2500 mg | INTRAMUSCULAR | Status: DC | PRN
  Administered 2020-02-10: 6.25 mg via INTRAVENOUS

## 2020-02-10 MED ORDER — MEPERIDINE HCL 50 MG/ML IJ SOLN: 6.2500 mg | INTRAMUSCULAR | Status: DC | PRN

## 2020-02-10 MED ORDER — SODIUM CHLORIDE 0.9 % IV SOLN: 2.0000 g | INTRAVENOUS | Status: DC

## 2020-02-10 MED ORDER — SODIUM CHLORIDE (PF) 0.9 % IJ SOLN
INTRAMUSCULAR | Status: AC
  Filled 2020-02-10: qty 50

## 2020-02-10 MED ORDER — SUCCINYLCHOLINE CHLORIDE 20 MG/ML IJ SOLN
INTRAMUSCULAR | Status: DC | PRN
  Administered 2020-02-10: 120 mg via INTRAVENOUS

## 2020-02-10 MED ORDER — HYDROMORPHONE HCL 1 MG/ML IJ SOLN
0.2500 mg | INTRAMUSCULAR | Status: DC | PRN
  Administered 2020-02-10 (×2): 0.25 mg via INTRAVENOUS
  Administered 2020-02-10: 0.5 mg via INTRAVENOUS

## 2020-02-10 MED ORDER — LACTATED RINGERS IV SOLN: INTRAVENOUS | Status: DC | PRN

## 2020-02-10 MED ORDER — METRONIDAZOLE IN NACL 5-0.79 MG/ML-% IV SOLN
500.0000 mg | Freq: Three times a day (TID) | INTRAVENOUS | Status: DC
  Administered 2020-02-11: 500 mg via INTRAVENOUS
  Filled 2020-02-10: qty 100

## 2020-02-10 MED ORDER — ROCURONIUM BROMIDE 10 MG/ML (PF) SYRINGE
PREFILLED_SYRINGE | INTRAVENOUS | Status: DC | PRN
  Administered 2020-02-10: 30 mg via INTRAVENOUS
  Administered 2020-02-10: 10 mg via INTRAVENOUS

## 2020-02-10 MED ORDER — ROCURONIUM BROMIDE 10 MG/ML (PF) SYRINGE
PREFILLED_SYRINGE | INTRAVENOUS | Status: AC
  Filled 2020-02-10: qty 20

## 2020-02-10 MED ORDER — PHENTERMINE HCL 37.5 MG PO TABS
37.5000 mg | ORAL_TABLET | Freq: Every day | ORAL | Status: DC
  Filled 2020-02-10: qty 1

## 2020-02-10 MED ORDER — SUCCINYLCHOLINE CHLORIDE 200 MG/10ML IV SOSY
PREFILLED_SYRINGE | INTRAVENOUS | Status: AC
  Filled 2020-02-10: qty 20

## 2020-02-10 MED ORDER — MORPHINE SULFATE (PF) 4 MG/ML IV SOLN
4.0000 mg | Freq: Once | INTRAVENOUS | Status: AC
  Administered 2020-02-10: 4 mg via INTRAVENOUS
  Filled 2020-02-10: qty 1

## 2020-02-10 MED ORDER — BUPIVACAINE HCL 0.25 % IJ SOLN
INTRAMUSCULAR | Status: AC
  Filled 2020-02-10: qty 1

## 2020-02-10 MED ORDER — METRONIDAZOLE IN NACL 5-0.79 MG/ML-% IV SOLN
INTRAVENOUS | Status: AC
  Filled 2020-02-10: qty 100

## 2020-02-10 MED ORDER — SUGAMMADEX SODIUM 200 MG/2ML IV SOLN
INTRAVENOUS | Status: DC | PRN
  Administered 2020-02-10: 200 mg via INTRAVENOUS

## 2020-02-10 MED ORDER — FENTANYL CITRATE (PF) 250 MCG/5ML IJ SOLN
INTRAMUSCULAR | Status: AC
  Filled 2020-02-10: qty 5

## 2020-02-10 MED ORDER — SODIUM CHLORIDE 0.9 % IV SOLN
INTRAVENOUS | Status: AC
  Filled 2020-02-10: qty 20

## 2020-02-10 MED ORDER — ONDANSETRON HCL 4 MG/2ML IJ SOLN
4.0000 mg | Freq: Once | INTRAMUSCULAR | Status: AC
  Administered 2020-02-10: 4 mg via INTRAVENOUS
  Filled 2020-02-10: qty 2

## 2020-02-10 MED ORDER — SODIUM CHLORIDE 0.9 % IV BOLUS
500.0000 mL | Freq: Once | INTRAVENOUS | Status: AC
  Administered 2020-02-10: 500 mL via INTRAVENOUS

## 2020-02-10 MED ORDER — SODIUM CHLORIDE 0.9 % IV BOLUS
1000.0000 mL | Freq: Once | INTRAVENOUS | Status: AC
  Administered 2020-02-10: 1000 mL via INTRAVENOUS

## 2020-02-10 MED ORDER — SODIUM CHLORIDE 0.45 % IV SOLN: INTRAVENOUS | Status: DC

## 2020-02-10 MED ORDER — DEXTROSE 5 % IV SOLN
INTRAVENOUS | Status: DC | PRN
  Administered 2020-02-10: 2 g via INTRAVENOUS

## 2020-02-10 MED ORDER — ACETAMINOPHEN 325 MG PO TABS: 650.0000 mg | ORAL_TABLET | Freq: Four times a day (QID) | ORAL | Status: DC | PRN

## 2020-02-10 MED ORDER — HYDROMORPHONE HCL 1 MG/ML IJ SOLN
INTRAMUSCULAR | Status: AC
  Filled 2020-02-10: qty 1

## 2020-02-10 MED ORDER — ALPRAZOLAM 0.5 MG PO TABS: 0.5000 mg | ORAL_TABLET | Freq: Every evening | ORAL | Status: DC | PRN

## 2020-02-10 MED ORDER — ACETAMINOPHEN 160 MG/5ML PO SOLN: 325.0000 mg | Freq: Once | ORAL | Status: DC | PRN

## 2020-02-10 MED ORDER — PROPOFOL 1000 MG/100ML IV EMUL
INTRAVENOUS | Status: AC
  Filled 2020-02-10: qty 200

## 2020-02-10 MED ORDER — ONDANSETRON HCL 4 MG/2ML IJ SOLN
INTRAMUSCULAR | Status: AC
  Filled 2020-02-10: qty 2

## 2020-02-10 MED ORDER — MIDAZOLAM HCL 2 MG/2ML IJ SOLN
INTRAMUSCULAR | Status: AC
  Filled 2020-02-10: qty 2

## 2020-02-10 MED ORDER — PROMETHAZINE HCL 25 MG/ML IJ SOLN
INTRAMUSCULAR | Status: AC
  Filled 2020-02-10: qty 1

## 2020-02-10 MED ORDER — DEXAMETHASONE SODIUM PHOSPHATE 10 MG/ML IJ SOLN
INTRAMUSCULAR | Status: DC | PRN
  Administered 2020-02-10: 10 mg via INTRAVENOUS

## 2020-02-10 MED ORDER — HYDROMORPHONE HCL 1 MG/ML IJ SOLN: 1.0000 mg | INTRAMUSCULAR | Status: DC | PRN

## 2020-02-10 MED ORDER — IOHEXOL 300 MG/ML  SOLN
100.0000 mL | Freq: Once | INTRAMUSCULAR | Status: AC | PRN
  Administered 2020-02-10: 100 mL via INTRAVENOUS

## 2020-02-10 MED ORDER — FENTANYL CITRATE (PF) 100 MCG/2ML IJ SOLN
INTRAMUSCULAR | Status: DC | PRN
  Administered 2020-02-10 (×3): 50 ug via INTRAVENOUS
  Administered 2020-02-10: 100 ug via INTRAVENOUS
  Administered 2020-02-10 (×2): 50 ug via INTRAVENOUS

## 2020-02-10 MED ORDER — ACETAMINOPHEN 10 MG/ML IV SOLN
INTRAVENOUS | Status: AC
  Filled 2020-02-10: qty 100

## 2020-02-10 MED ORDER — ACETAMINOPHEN 325 MG PO TABS: 325.0000 mg | ORAL_TABLET | Freq: Once | ORAL | Status: DC | PRN

## 2020-02-10 MED ORDER — PROPOFOL 10 MG/ML IV BOLUS
INTRAVENOUS | Status: DC | PRN
  Administered 2020-02-10: 140 mg via INTRAVENOUS

## 2020-02-10 MED ORDER — ONDANSETRON HCL 4 MG/2ML IJ SOLN
4.0000 mg | Freq: Four times a day (QID) | INTRAMUSCULAR | Status: DC | PRN
  Administered 2020-02-11: 4 mg via INTRAVENOUS
  Filled 2020-02-10: qty 2

## 2020-02-10 MED ORDER — METRONIDAZOLE IN NACL 5-0.79 MG/ML-% IV SOLN
INTRAVENOUS | Status: DC | PRN
  Administered 2020-02-10: 500 mg via INTRAVENOUS

## 2020-02-10 MED ORDER — ONDANSETRON HCL 4 MG/2ML IJ SOLN
INTRAMUSCULAR | Status: DC | PRN
  Administered 2020-02-10: 4 mg via INTRAVENOUS

## 2020-02-10 MED ORDER — LACTATED RINGERS IV SOLN: INTRAVENOUS | Status: DC

## 2020-02-10 MED ORDER — ACETAMINOPHEN 650 MG RE SUPP: 650.0000 mg | Freq: Four times a day (QID) | RECTAL | Status: DC | PRN

## 2020-02-10 MED ORDER — MIDAZOLAM HCL 5 MG/5ML IJ SOLN
INTRAMUSCULAR | Status: DC | PRN
  Administered 2020-02-10: 2 mg via INTRAVENOUS

## 2020-02-10 MED ORDER — PHENYLEPHRINE 40 MCG/ML (10ML) SYRINGE FOR IV PUSH (FOR BLOOD PRESSURE SUPPORT)
PREFILLED_SYRINGE | INTRAVENOUS | Status: DC | PRN
  Administered 2020-02-10: 80 ug via INTRAVENOUS

## 2020-02-10 MED ORDER — FENTANYL CITRATE (PF) 100 MCG/2ML IJ SOLN
INTRAMUSCULAR | Status: AC
  Filled 2020-02-10: qty 2

## 2020-02-10 SURGICAL SUPPLY — 34 items
APPLIER CLIP ROT 10 11.4 M/L (STAPLE)
CHLORAPREP W/TINT 26 (MISCELLANEOUS) ×3 IMPLANT
CLIP APPLIE ROT 10 11.4 M/L (STAPLE) IMPLANT
CLOSURE WOUND 1/2 X4 (GAUZE/BANDAGES/DRESSINGS) ×1
COVER SURGICAL LIGHT HANDLE (MISCELLANEOUS) ×3 IMPLANT
COVER WAND RF STERILE (DRAPES) IMPLANT
CUTTER FLEX LINEAR 45M (STAPLE) ×3 IMPLANT
DECANTER SPIKE VIAL GLASS SM (MISCELLANEOUS) ×3 IMPLANT
DERMABOND ADVANCED (GAUZE/BANDAGES/DRESSINGS) ×2
DERMABOND ADVANCED .7 DNX12 (GAUZE/BANDAGES/DRESSINGS) ×1 IMPLANT
DRAPE LAPAROSCOPIC ABDOMINAL (DRAPES) ×3 IMPLANT
ELECT REM PT RETURN 15FT ADLT (MISCELLANEOUS) ×3 IMPLANT
ENDOLOOP SUT PDS II  0 18 (SUTURE)
ENDOLOOP SUT PDS II 0 18 (SUTURE) IMPLANT
GLOVE SURG ORTHO 8.0 STRL STRW (GLOVE) ×3 IMPLANT
GOWN STRL REUS W/TWL XL LVL3 (GOWN DISPOSABLE) ×6 IMPLANT
KIT BASIN (CUSTOM PROCEDURE TRAY) ×3 IMPLANT
KIT TURNOVER KIT A (KITS) IMPLANT
PENCIL SMOKE EVACUATOR (MISCELLANEOUS) IMPLANT
POUCH SPECIMEN RETRIEVAL 10MM (ENDOMECHANICALS) ×3 IMPLANT
RELOAD 45 VASCULAR/THIN (ENDOMECHANICALS) IMPLANT
RELOAD STAPLE TA45 3.5 REG BLU (ENDOMECHANICALS) ×3 IMPLANT
SET IRRIG TUBING LAPAROSCOPIC (IRRIGATION / IRRIGATOR) ×3 IMPLANT
SET TUBE SMOKE EVAC HIGH FLOW (TUBING) ×3 IMPLANT
SHEARS HARMONIC ACE PLUS 36CM (ENDOMECHANICALS) ×3 IMPLANT
STRIP CLOSURE SKIN 1/2X4 (GAUZE/BANDAGES/DRESSINGS) ×2 IMPLANT
SUT MNCRL AB 4-0 PS2 18 (SUTURE) ×3 IMPLANT
TOWEL OR 17X26 10 PK STRL BLUE (TOWEL DISPOSABLE) ×3 IMPLANT
TOWEL OR NON WOVEN STRL DISP B (DISPOSABLE) ×3 IMPLANT
TRAY FOLEY MTR SLVR 14FR STAT (SET/KITS/TRAYS/PACK) IMPLANT
TRAY FOLEY MTR SLVR 16FR STAT (SET/KITS/TRAYS/PACK) IMPLANT
TRAY LAPAROSCOPIC (CUSTOM PROCEDURE TRAY) ×3 IMPLANT
TROCAR XCEL BLUNT TIP 100MML (ENDOMECHANICALS) ×3 IMPLANT
TROCAR XCEL NON-BLD 11X100MML (ENDOMECHANICALS) ×3 IMPLANT

## 2020-02-10 NOTE — Anesthesia Procedure Notes (Signed)
Procedure Name: Intubation Performed by: Gean Maidens, CRNA Pre-anesthesia Checklist: Patient identified, Emergency Drugs available, Suction available, Patient being monitored and Timeout performed Patient Re-evaluated:Patient Re-evaluated prior to induction Oxygen Delivery Method: Circle system utilized Preoxygenation: Pre-oxygenation with 100% oxygen Induction Type: IV induction and Rapid sequence Laryngoscope Size: Mac and 3 Grade View: Grade I Tube type: Oral Tube size: 7.0 mm Number of attempts: 1 Airway Equipment and Method: Stylet Placement Confirmation: ETT inserted through vocal cords under direct vision,  positive ETCO2 and breath sounds checked- equal and bilateral Secured at: 21 cm Tube secured with: Tape Dental Injury: Teeth and Oropharynx as per pre-operative assessment

## 2020-02-10 NOTE — Anesthesia Preprocedure Evaluation (Addendum)
Anesthesia Evaluation  Patient identified by MRN, date of birth, ID band Patient awake    Reviewed: Allergy & Precautions, NPO status , Patient's Chart, lab work & pertinent test results  History of Anesthesia Complications (+) PONV and history of anesthetic complications  Airway Mallampati: I  TM Distance: >3 FB Neck ROM: Full    Dental  (+) Teeth Intact, Dental Advisory Given   Pulmonary former smoker,    breath sounds clear to auscultation       Cardiovascular hypertension,  Rhythm:Regular Rate:Normal     Neuro/Psych PSYCHIATRIC DISORDERS Anxiety Depression Bipolar Disorder negative neurological ROS     GI/Hepatic negative GI ROS, Neg liver ROS,   Endo/Other  negative endocrine ROS  Renal/GU negative Renal ROS     Musculoskeletal negative musculoskeletal ROS (+)   Abdominal Normal abdominal exam  (+)   Peds  Hematology negative hematology ROS (+)   Anesthesia Other Findings   Reproductive/Obstetrics                            Anesthesia Physical Anesthesia Plan  ASA: II  Anesthesia Plan: General   Post-op Pain Management:    Induction: Intravenous  PONV Risk Score and Plan: 4 or greater and Ondansetron, Dexamethasone, Scopolamine patch - Pre-op, TIVA and Midazolam  Airway Management Planned: Oral ETT  Additional Equipment: None  Intra-op Plan:   Post-operative Plan: Extubation in OR  Informed Consent: I have reviewed the patients History and Physical, chart, labs and discussed the procedure including the risks, benefits and alternatives for the proposed anesthesia with the patient or authorized representative who has indicated his/her understanding and acceptance.     Dental advisory given  Plan Discussed with: CRNA  Anesthesia Plan Comments:       Anesthesia Quick Evaluation

## 2020-02-10 NOTE — H&P (Signed)
Jocelyn Waters is an 39 y.o. female.    General Surgery [facility]  Chief Complaint: acute appendicitis, abdominal pain  HPI: Patient is a 39 year old female who presents to the emergency department with less than 24-hour history of lower abdominal pain localizing to the right lower quadrant.  Patient was awakened from sleep at approximately 3 AM this morning.  She developed nausea.  She was seen and evaluated at the office of her primary care physician and sent to the emergency department for further evaluation.  White blood cell count was elevated at 15,000.  CT scan of abdomen and pelvis demonstrated findings consistent with early acute appendicitis.  Previous abdominal surgery includes laparoscopic cholecystectomy 2 months ago.  Patient is accompanied by her husband.  General surgery is called for evaluation and management.  Past Medical History:  Diagnosis Date  . ADD (attention deficit disorder)   . Anxiety   . Depression   . History of abnormal cervical Pap smear 2007  . History of kidney stones 2016  . History of pregnancy induced hypertension   . Hx of varicella   . PONV (postoperative nausea and vomiting)     Past Surgical History:  Procedure Laterality Date  . CHOLECYSTECTOMY N/A 12/16/2019   Procedure: LAPAROSCOPIC CHOLECYSTECTOMY;  Surgeon: [name], MD;  Location: [facility];  Service: General;  Laterality: N/A;  GENERAL WITH BILATERAL TAP BLOCK  . DILATION AND EVACUATION N/A 10/13/2019   Procedure: SUCTION  DILATATION AND EVACUATION WITH CHROMOSOME STUDIES;  Surgeon: Essie Hart, MD;  Location: [facility] ;  Service: Gynecology;  Laterality: N/A;  . KNEE ARTHROSCOPY Right 2000  . THERAPEUTIC ABORTION  05/2006  . WISDOM TOOTH EXTRACTION  [age]    Family History  Problem Relation Age of Onset  . Diabetes Maternal Aunt   . Hypertension Maternal Grandmother   . Hypertension Paternal Grandmother   . Diabetes  Paternal Grandfather    Social History:  reports that she quit smoking about 7 years ago. She quit after 15.00 years of use. She has never used smokeless tobacco. She reports previous alcohol use. She reports that she does not use drugs.  Allergies:  Allergies  Allergen Reactions  . Biaxin [Clarithromycin] Rash  . Septra [Bactrim] Rash  . Sulfa Antibiotics Rash    (Not in a hospital admission)   Results for orders placed or performed during the hospital encounter of 02/10/20 (from the past 48 hour(s))  Lipase, blood     Status: None   Collection Time: 02/10/20 12:22 PM  Result Value Ref Range   Lipase 21 11 - 51 U/L    Comment: Performed at [facility], [address] [place], [place] [address]  Comprehensive metabolic panel     Status: Abnormal   Collection Time: 02/10/20 12:22 PM  Result Value Ref Range   Sodium 132 (L) 135 - 145 mmol/L   Potassium 4.5 3.5 - 5.1 mmol/L   Chloride 100 98 - 111 mmol/L   CO2 24 22 - 32 mmol/L   Glucose, Bld 103 (H) 70 - 99 mg/dL    Comment: Glucose reference range applies only to samples taken after fasting for at least 8 hours.   BUN 11 6 - 20 mg/dL   Creatinine, Ser 2.02 0.44 - 1.00 mg/dL   Calcium 9.0 8.9 - 54.2 mg/dL   Total Protein 7.7 6.5 - 8.1 g/dL   Albumin 4.9 3.5 - 5.0 g/dL   AST 18 15 -  41 U/L   ALT 14 0 - 44 U/L   Alkaline Phosphatase 57 38 - 126 U/L   Total Bilirubin 0.9 0.3 - 1.2 mg/dL   GFR calc non Af Amer >60 >60 mL/min   GFR calc Af Amer >60 >60 mL/min   Anion gap 8 5 - 15    Comment: Performed at [facility], [address] [place], [place] [address]  CBC     Status: Abnormal   Collection Time: 02/10/20 12:22 PM  Result Value Ref Range   WBC 15.4 (H) 4.0 - 10.5 K/uL   RBC 4.44 3.87 - 5.11 MIL/uL   Hemoglobin 13.5 12.0 - 15.0 g/dL   HCT 62.139.5 36 - 46 %   MCV 89.0 80.0 - 100.0 fL   MCH 30.4 26.0 - 34.0 pg   MCHC 34.2 30.0 - 36.0 g/dL   RDW 30.812.2 65.711.5 - 84.615.5 %   Platelets 189  150 - 400 K/uL   nRBC 0.0 0.0 - 0.2 %    Comment: Performed at [facility], [address] [place], [place] [address]  I-Stat beta hCG blood, ED     Status: None   Collection Time: 02/10/20 12:41 PM  Result Value Ref Range   I-stat hCG, quantitative <5.0 <5 mIU/mL   Comment 3            Comment:   GEST. AGE      CONC.  (mIU/mL)   <=1 WEEK        5 - 50     2 WEEKS       50 - 500     3 WEEKS       100 - 10,000     4 WEEKS     1,000 - 30,000        FEMALE AND NON-PREGNANT FEMALE:     LESS THAN 5 mIU/mL   Urinalysis, Routine w reflex microscopic     Status: Abnormal   Collection Time: 02/10/20  3:50 PM  Result Value Ref Range   Color, Urine STRAW (A) YELLOW   APPearance CLEAR CLEAR   Specific Gravity, Urine 1.002 (L) 1.005 - 1.030   pH 7.0 5.0 - 8.0   Glucose, UA NEGATIVE NEGATIVE mg/dL   Hgb urine dipstick SMALL (A) NEGATIVE   Bilirubin Urine NEGATIVE NEGATIVE   Ketones, ur 20 (A) NEGATIVE mg/dL   Protein, ur NEGATIVE NEGATIVE mg/dL   Nitrite NEGATIVE NEGATIVE   Leukocytes,Ua NEGATIVE NEGATIVE   RBC / HPF 0-5 0 - 5 RBC/hpf   WBC, UA 0-5 0 - 5 WBC/hpf   Bacteria, UA NONE SEEN NONE SEEN   Squamous Epithelial / LPF 0-5 0 - 5    Comment: Performed at [facility], [address] [place], [place] [address]  SARS Coronavirus 2 by RT PCR (hospital order, performed in [facility] hospital lab) Nasopharyngeal Nasopharyngeal Swab     Status: None   Collection Time: 02/10/20  4:11 PM   Specimen: Nasopharyngeal Swab  Result Value Ref Range   SARS Coronavirus 2 NEGATIVE NEGATIVE    Comment: (NOTE) SARS-CoV-2 target nucleic acids are NOT DETECTED.  The SARS-CoV-2 RNA is generally detectable in upper and lower respiratory specimens during the acute phase of infection. The lowest concentration of SARS-CoV-2 viral copies this assay can detect is 250 copies / mL. A negative result does not preclude SARS-CoV-2 infection and should not be used as the  sole basis for treatment or other  patient management decisions.  A negative result may occur with improper specimen collection / handling, submission of specimen other than nasopharyngeal swab, presence of viral mutation(s) within the areas targeted by this assay, and inadequate number of viral copies (<250 copies / mL). A negative result must be combined with clinical observations, patient history, and epidemiological information.  Fact Sheet for Patients:   BoilerBrush.com.cy  Fact Sheet for Healthcare Providers: https://pope.com/  This test is not yet approved or  cleared by the [place] FDA and has been authorized for detection and/or diagnosis of SARS-CoV-2 by FDA under an Emergency Use Authorization (EUA).  This EUA will remain in effect (meaning this test can be used) for the duration of the COVID-19 declaration under Section 564(b)(1) of the Act, 21 U.S.C. section 360bbb-3(b)(1), unless the authorization is terminated or revoked sooner.  Performed at [facility], [address] [place], [place] [address]    CT ABDOMEN PELVIS W CONTRAST  Result Date: 02/10/2020 CLINICAL DATA:  Right lower quadrant pain EXAM: CT ABDOMEN AND PELVIS WITH CONTRAST TECHNIQUE: Multidetector CT imaging of the abdomen and pelvis was performed using the standard protocol following bolus administration of intravenous contrast. CONTRAST:  OMNIPAQUE IOHEXOL 300 MG/ML  SOLN COMPARISON:  Ultrasound 11/19/2019 FINDINGS: Lower chest: Lung bases demonstrate no acute consolidation or effusion. Heart size is normal. Hepatobiliary: No focal liver abnormality is seen. Status post cholecystectomy. No biliary dilatation. Pancreas: Unremarkable. No pancreatic ductal dilatation or surrounding inflammatory changes. Spleen: Normal in size without focal abnormality. Adrenals/Urinary Tract: Adrenal glands are normal. No hydronephrosis. 2-3 mm stone  lower pole right kidney. At least 2 stones in the mid to upper left kidney measuring up to 5 mm in size. Urinary bladder is unremarkable. Stomach/Bowel: Stomach is nonenlarged. No dilated small bowel. Appendix slightly enlarged measuring up to 9 mm. No intraluminal stones. The tip is of normal caliber. Mild fat stranding in the fat surrounding the slightly dilated segment of the appendix. No extraluminal gas. Vascular/Lymphatic: No significant vascular findings are present. No enlarged abdominal or pelvic lymph nodes. Reproductive: Uterus and bilateral adnexa are unremarkable. Other: No abdominal wall hernia or abnormality. No abdominopelvic ascites. Musculoskeletal: No acute or significant osseous findings. IMPRESSION: 1. Slightly enlarged appendix measuring up to 9 mm with mild fat stranding/inflammation in the fat surrounding the slightly dilated segment of the appendix. Findings could be secondary to early or mild appendicitis. No perforation or abscess. Appendix: Location: Right lower quadrant Diameter: 9 mm Appendicolith: Negative Mucosal hyper-enhancement: Negative Extraluminal gas: Negative Periappendiceal collection: Negative 2.      Bilateral nonobstructing kidney stones. Electronically Signed   By: Jasmine Pang M.D.   On: 02/10/2020 17:35    Review of Systems  Constitutional: Negative.   HENT: Negative.   Eyes: Negative.   Respiratory: Negative.   Cardiovascular: Negative.   Gastrointestinal: Positive for abdominal pain and nausea.  Endocrine: Negative.   Genitourinary: Negative.   Musculoskeletal: Negative.   Skin: Negative.   Allergic/Immunologic: Negative.   Neurological: Negative.   Hematological: Negative.   Psychiatric/Behavioral: Negative.      -----------------  Blood pressure 111/80, pulse 97, temperature 98.2 F (36.8 C), temperature source Oral, resp. rate 18, SpO2 100 %.  CONSTITUTIONAL: no acute distress; conversant; no obvious deformities EYES: conjunctiva  moist; no lid lag; anicteric; pupils equal bilaterally NECK: trachea midline; no thyroid nodularity LUNGS: respiratory effort normal & unlabored; no wheeze; no rales; no tactile fremitus CV: rate and rhythm regular; no palpable thrills; no  murmur; no edema bilat lower extremities GI: abdomen soft without distension; tenderness to palpation RLQ with guarding; no mass; no hepatosplenomegaly; no obvious hernia; well-healed incisions MSK: normal range of motion of extremities; no clubbing; no cyanosis PSYCHIATRIC: appropriate affect for situation; alert and oriented to person, place, & time LYMPHATIC: no palpable cervical lymphadenopathy; no evidence lymphedema in extremities  -----------------  Assessment/Plan Acute appendicitis  Admit to general surgery service  Begin IV abx's per protocol  Plan lap appendectomy this evening when OR available I saw and examined the patient in the emergency department.  Her husband was at the bedside.  We discussed the indications for surgery.  We discussed laparoscopic appendectomy.  We discussed the anticipated hospital stay and postoperative recovery. The risks and benefits of the procedure have been discussed at length with the patient.  The patient understands the proposed procedure, potential alternative treatments, and the course of recovery to be expected.  All of the patient's questions have been answered at this time.  The patient wishes to proceed with surgery.  Darnell Level, MD [facility] Office: [phone]    Darnell Level, MD 02/10/2020, 7:40 PM

## 2020-02-10 NOTE — Transfer of Care (Signed)
Immediate Anesthesia Transfer of Care Note  Patient: Jocelyn Waters  Procedure(s) Performed: APPENDECTOMY LAPAROSCOPIC (N/A Abdomen)  Patient Location: PACU  Anesthesia Type:General  Level of Consciousness: sedated, patient cooperative and responds to stimulation  Airway & Oxygen Therapy: Patient Spontanous Breathing and Patient connected to face mask oxygen  Post-op Assessment: Report given to RN and Post -op Vital signs reviewed and stable  Post vital signs: Reviewed and stable  Last Vitals:  Vitals Value Taken Time  BP 124/62 02/10/20 2152  Temp    Pulse 92 02/10/20 2153  Resp 9 02/10/20 2153  SpO2 100 % 02/10/20 2153  Vitals shown include unvalidated device data.  Last Pain:  Vitals:   02/10/20 1817  TempSrc:   PainSc: 5          Complications: No complications documented.

## 2020-02-10 NOTE — ED Triage Notes (Signed)
Per pt, states abdominal pain that started this am-states she went to PCP who advised she come to ED for possible appey

## 2020-02-10 NOTE — Op Note (Signed)
OPERATIVE REPORT - LAPAROSCOPIC APPENDECTOMY  Preop diagnosis:  Acute appendicitis  Postop diagnosis:  same  Procedure:  Laparoscopic appendectomy  Surgeon:  Darnell Level, MD  Anesthesia:  general endotracheal  Estimated blood loss:  minimal  Preparation:  Chlora-prep  Complications:  none  Indications:  Patient is a 39 year old female who presents to the emergency department with less than 24-hour history of lower abdominal pain localizing to the right lower quadrant.  Patient was awakened from sleep at approximately 3 AM this morning.  She developed nausea.  She was seen and evaluated at the office of her primary care physician and sent to the emergency department for further evaluation.  White blood cell count was elevated at 15,000.  CT scan of abdomen and pelvis demonstrated findings consistent with early acute appendicitis.  Previous abdominal surgery includes laparoscopic cholecystectomy 2 months ago.  Patient is accompanied by her husband.  General surgery is called for evaluation and management.  Procedure:  Patient is brought to the operating room and placed in a supine position on the operating room table. Following administration of general anesthesia, a time out was held and the patient's name and procedure is confirmed. Patient is then prepped and draped in the usual strict aseptic fashion.  After ascertaining that an adequate level of anesthesia has been achieved, a peri-umbilical incision is made with a #15 blade. Dissection is carried down to the fascia. Fascia is incised in the midline and the peritoneal cavity is entered cautiously. A #0-vicryl pursestring suture is placed in the fascia. An Hassan cannula is introduced under direct vision and secured with the pursestring suture. The abdomen is insufflated with carbon dioxide. The laparoscope is introduced and the abdomen is explored. Operative ports are placed in the right upper quadrant and left lower quadrant. The appendix  is identified. The mesoappendix is divided with the harmonic scalpel. Dissection is carried down to the base of the appendix. The base of the appendix is dissected out clearing the junction with the cecal wall. Using an Endo-GIA stapler, the base of the appendix is transected at the junction with the cecal wall. There is good approximation of tissue along the staple line. There is good hemostasis along the staple line. The appendix is placed into an endo-catch bag and withdrawn through the umbilical port. The #0-vicryl pursestring suture is tied securely.  Right lower quadrant is irrigated with warm saline which is evacuated. Good hemostasis is noted. Ports are removed under direct vision. Good hemostasis is noted at the port sites. Pneumoperitoneum is released.  Skin incisions are anesthetized with local anesthetic. Wounds are closed with interrupted 4-0 Monocryl subcuticular sutures. Wounds are washed and dried and Dermabond was applied. The patient is awakened from anesthesia and brought to the recovery room. The patient tolerated the procedure well.  Darnell Level, MD [facility] Office: [phone]

## 2020-02-10 NOTE — ED Provider Notes (Signed)
[facility]-EMERGENCY DEPT Provider Note   CSN: [id number] Arrival date & time: 02/10/20  1031     History Chief Complaint  Patient presents with   Abdominal Pain    Jocelyn Waters is a 39 y.o. female.  The history is provided by the patient. No language interpreter was used.  Abdominal Pain Pain location:  RLQ Pain quality: aching   Pain radiates to:  Does not radiate Pain severity:  Severe Onset quality:  Gradual Duration:  2 days Timing:  Constant Progression:  Waxing and waning Chronicity:  New Context: previous surgery (chole)   Relieved by:  Nothing Worsened by:  Palpation Ineffective treatments:  None tried Associated symptoms: nausea   Associated symptoms: no belching, no chest pain, no chills, no constipation, no cough, no diarrhea, no dysuria, no fatigue, no fever, no melena, no shortness of breath, no vaginal bleeding, no vaginal discharge and no vomiting        Past Medical History:  Diagnosis Date   ADD (attention deficit disorder)    Anxiety    Depression    History of abnormal cervical Pap smear 2007   History of kidney stones 2016   History of pregnancy induced hypertension    Hx of varicella    PONV (postoperative nausea and vomiting)     Patient Active Problem List   Diagnosis Date Noted   HTN (hypertension) 12/03/2016   Hyperemesis affecting pregnancy, antepartum 06/03/2016   Normal vaginal delivery 12/09/2014   Pregnancy 12/06/2014   Pregnancy induced hypertension, antepartum    Preeclampsia 11/22/2014   Pyelonephritis affecting pregnancy in second trimester, antepartum 08/19/2014   Reflux    Bipolar disorder (HCC)    ADD (attention deficit disorder)    ASCUS (atypical squamous cells of undetermined significance) on Pap smear     Past Surgical History:  Procedure Laterality Date   CHOLECYSTECTOMY N/A 12/16/2019   Procedure: LAPAROSCOPIC CHOLECYSTECTOMY;  Surgeon: [name], MD;   [facility]  [facility];  Service: General;  Laterality: N/A;  GENERAL WITH BILATERAL TAP BLOCK   DILATION AND EVACUATION N/A 10/13/2019   Procedure: SUCTION  DILATATION AND EVACUATION WITH CHROMOSOME STUDIES;  Surgeon: Essie Hart, MD;  Location: [facility] [place];  Service: Gynecology;  Laterality: N/A;   KNEE ARTHROSCOPY Right 2000   THERAPEUTIC ABORTION  05/2006   WISDOM TOOTH EXTRACTION  [age]     OB History    Gravida  3   Para  2   Term  2   Preterm  0   AB  1   Living  2     SAB  0   TAB  1   Ectopic  0   Multiple  0   Live Births  2           Family History  Problem Relation Age of Onset   Diabetes Maternal Aunt    Hypertension Maternal Grandmother    Hypertension Paternal Grandmother    Diabetes Paternal Grandfather     Social History   Tobacco Use   Smoking status: Former Smoker    Years: 15.00    Quit date: 08/31/2012    Years since quitting: 7.4   Smokeless tobacco: Never Used  Vaping Use   Vaping Use: Former   Quit date: 10/11/2012  Substance Use Topics   Alcohol use: Not Currently    Comment: SOCIALY   Drug use: No    Home Medications Prior to Admission medications   Medication Sig  Start Date End Date Taking? Authorizing Provider  ALPRAZolam Prudy Feeler) 0.5 MG tablet Take 0.5 mg by mouth at bedtime as needed for anxiety.    [provider]  oxyCODONE (OXY IR/ROXICODONE) 5 MG immediate release tablet Take 1 tablet (5 mg total) by mouth every 6 (six) hours as needed. 12/16/19   [name], MD    Allergies    Biaxin [clarithromycin], Septra [bactrim], and Sulfa antibiotics  Review of Systems   Review of Systems  Constitutional: Negative for chills, diaphoresis, fatigue and fever.  HENT: Negative for congestion.   Eyes: Negative for visual disturbance.  Respiratory: Negative for cough, chest tightness, shortness of breath and wheezing.   Cardiovascular: Negative for chest pain,  palpitations and leg swelling.  Gastrointestinal: Positive for abdominal pain and nausea. Negative for abdominal distention, constipation, diarrhea, melena and vomiting.  Genitourinary: Negative for dysuria, flank pain, frequency, pelvic pain, urgency, vaginal bleeding, vaginal discharge and vaginal pain.  Musculoskeletal: Negative for back pain, neck pain and neck stiffness.  Skin: Negative for rash and wound.  Neurological: Negative for headaches.  Psychiatric/Behavioral: Negative for agitation.  All other systems reviewed and are negative.   Physical Exam Updated Vital Signs BP 112/75 (BP Location: Left Arm)    Pulse (!) 109    Temp 98.2 F (36.8 C) (Oral)    Resp 16    SpO2 100%   Physical Exam Vitals and nursing note reviewed.  Constitutional:      General: She is not in acute distress.    Appearance: She is well-developed. She is not ill-appearing, toxic-appearing or diaphoretic.  HENT:     Head: Normocephalic and atraumatic.  Eyes:     Conjunctiva/sclera: Conjunctivae normal.  Cardiovascular:     Rate and Rhythm: Regular rhythm. Tachycardia present.     Heart sounds: Normal heart sounds. No murmur heard.   Pulmonary:     Effort: Pulmonary effort is normal. No respiratory distress.     Breath sounds: Normal breath sounds. No wheezing, rhonchi or rales.  Chest:     Chest wall: No tenderness.  Abdominal:     General: Abdomen is flat. A surgical scar is present. Bowel sounds are normal. There is no distension.     Palpations: Abdomen is soft.     Tenderness: There is abdominal tenderness in the right lower quadrant. There is no right CVA tenderness, left CVA tenderness, guarding or rebound.  Genitourinary:    Comments: Initially deferred during shared decision making conversation Musculoskeletal:     Cervical back: Neck supple.  Skin:    General: Skin is warm and dry.  Neurological:     General: No focal deficit present.     Mental Status: She is alert.  Psychiatric:         Mood and Affect: Mood normal.     ED Results / Procedures / Treatments   Labs (all labs ordered are listed, but only abnormal results are displayed) Labs Reviewed  COMPREHENSIVE METABOLIC PANEL - Abnormal; Notable for the following components:      Result Value   Sodium 132 (*)    Glucose, Bld 103 (*)    All other components within normal limits  CBC - Abnormal; Notable for the following components:   WBC 15.4 (*)    All other components within normal limits  URINALYSIS, ROUTINE W REFLEX MICROSCOPIC - Abnormal; Notable for the following components:   Color, Urine STRAW (*)    Specific Gravity, Urine 1.002 (*)    Hgb  urine dipstick SMALL (*)    Ketones, ur 20 (*)    All other components within normal limits  SARS CORONAVIRUS 2 BY RT PCR (HOSPITAL ORDER, PERFORMED IN [facility])  URINE CULTURE  LIPASE, BLOOD  I-STAT BETA HCG BLOOD, ED ([facility], [facility], [facility] ONLY)    EKG None  Radiology CT ABDOMEN PELVIS W CONTRAST  Result Date: 02/10/2020 CLINICAL DATA:  Right lower quadrant pain EXAM: CT ABDOMEN AND PELVIS WITH CONTRAST TECHNIQUE: Multidetector CT imaging of the abdomen and pelvis was performed using the standard protocol following bolus administration of intravenous contrast. CONTRAST:  100mL OMNIPAQUE IOHEXOL 300 MG/ML  SOLN COMPARISON:  Ultrasound 11/19/2019 FINDINGS: Lower chest: Lung bases demonstrate no acute consolidation or effusion. Heart size is normal. Hepatobiliary: No focal liver abnormality is seen. Status post cholecystectomy. No biliary dilatation. Pancreas: Unremarkable. No pancreatic ductal dilatation or surrounding inflammatory changes. Spleen: Normal in size without focal abnormality. Adrenals/Urinary Tract: Adrenal glands are normal. No hydronephrosis. 2-3 mm stone lower pole right kidney. At least 2 stones in the mid to upper left kidney measuring up to 5 mm in size. Urinary bladder is unremarkable. Stomach/Bowel: Stomach is nonenlarged. No dilated  small bowel. Appendix slightly enlarged measuring up to 9 mm. No intraluminal stones. The tip is of normal caliber. Mild fat stranding in the fat surrounding the slightly dilated segment of the appendix. No extraluminal gas. Vascular/Lymphatic: No significant vascular findings are present. No enlarged abdominal or pelvic lymph nodes. Reproductive: Uterus and bilateral adnexa are unremarkable. Other: No abdominal wall hernia or abnormality. No abdominopelvic ascites. Musculoskeletal: No acute or significant osseous findings. IMPRESSION: 1. Slightly enlarged appendix measuring up to 9 mm with mild fat stranding/inflammation in the fat surrounding the slightly dilated segment of the appendix. Findings could be secondary to early or mild appendicitis. No perforation or abscess. Appendix: Location: Right lower quadrant Diameter: 9 mm Appendicolith: Negative Mucosal hyper-enhancement: Negative Extraluminal gas: Negative Periappendiceal collection: Negative 2.      Bilateral nonobstructing kidney stones. Electronically Signed   By: Jasmine PangKim  Fujinaga M.D.   On: 02/10/2020 17:35    Procedures Procedures (including critical care time)  Medications Ordered in ED Medications  sodium chloride (PF) 0.9 % injection (has no administration in time range)  fentaNYL (SUBLIMAZE) injection 50 mcg (has no administration in time range)  sodium chloride 0.9 % bolus 500 mL (has no administration in time range)  morphine 4 MG/ML injection 4 mg (4 mg Intravenous Given 02/10/20 1556)  ondansetron (ZOFRAN) injection 4 mg (4 mg Intravenous Given 02/10/20 1556)  sodium chloride 0.9 % bolus 1,000 mL (1,000 mLs Intravenous New Bag/Given (Non-Interop) 02/10/20 1556)  iohexol (OMNIPAQUE) 300 MG/ML solution 100 mL (100 mLs Intravenous Contrast Given 02/10/20 1652)    ED Course  I have reviewed the triage vital signs and the nursing notes.  Pertinent labs & imaging results that were available during my care of the patient were reviewed by me  and considered in my medical decision making (see chart for details).    MDM Rules/Calculators/A&P                          Jocelyn Waters is a 39 y.o. female with a past medical history significant for hypertension, anxiety, bipolar disorder, prior pyelonephritis, prior kidney stones, and recent cholecystectomy on 12/16/2019 who presents at the direction of her PCP for evaluation of right lower quadrant abdominal pain concerning for appendicitis.  Patient reports that for  the last 2 days, she has been having right lower quadrant abdominal pain as well as her mid lower abdominal pain.  She denies any urinary symptoms.  She denies any vaginal bleeding or vaginal discharge.  She thinks this does not feel like prior ovarian cyst or kidney stone she has had in the past.  She has had some nausea no vomiting.  She denies any appetite.  She has not eaten anything today and only had some coffee several hours ago.  She denies any trauma.  She went to her PCP where they reportedly did a urine and told her it was not infected but after their evaluation they were concerned about appendicitis.  Patient was transferred here for evaluation of the pain.  She denies other complaints on arrival.  On exam, lungs clear and chest nontender.  Abdomen is tender focally in the right lower quadrant.  Less tenderness in the suprapubic area.  No flank or back tenderness.  No CVA tenderness.  Normal bowel sounds.  No rash seen.  No other abnormalities on exam.  Patient has healing scars on her abdomen from the cholecystectomy recently.  Had a shared decision-making conversation with the patient and family.  Given the concern for appendicitis, we agreed to get a CT scan initially to look for the source right lower abdominal pain.  We discussed the possibilities of appendicitis versus stone or infected stone, diverticulitis, or an ovarian etiology.  We agreed to get the CT scan first and if the CT is complete unremarkable, we would  consider pelvic exam and pelvic ultrasound as well.  Patient was given pain medicine, nausea medicine, and fluids and remain n.p.o. while we start her work-up.  Will get Covid test in case she needs to go to the OR.  Patient is agreeable to this plan.  Anticipate reassessment after work-up.  5:50 PM CT scan returned and does show concern for early appendicitis.  Given the patient's location of pain, leukocytosis, decreased appetite, and imaging concerning for dilated appendix with stranding in early appendicitis, will call general surgery for management.  General surgery will come to the patient and the anticipate appendectomy this evening.  Covid test is returned negative.  Urinalysis does not show infection.  Patient aware of plan and will be admitted by surgery.   Final Clinical Impression(s) / ED Diagnoses Final diagnoses:  Acute appendicitis, unspecified acute appendicitis type     Clinical Impression: 1. Acute appendicitis, unspecified acute appendicitis type     Disposition: Admit  This note was prepared with assistance of Dragon voice recognition software. Occasional wrong-word or sound-a-like substitutions may have occurred due to the inherent limitations of voice recognition software.     Poppy Mcafee, Canary Brim, MD 02/10/20 [phone]

## 2020-02-11 ENCOUNTER — Encounter (HOSPITAL_COMMUNITY): Payer: Self-pay | Admitting: Surgery

## 2020-02-11 MED ORDER — STERILE WATER FOR INJECTION IJ SOLN
INTRAMUSCULAR | Status: AC
  Filled 2020-02-11: qty 10

## 2020-02-11 MED ORDER — ACETAMINOPHEN 325 MG PO TABS: 650.0000 mg | ORAL_TABLET | Freq: Four times a day (QID) | ORAL | Status: DC | PRN

## 2020-02-11 MED ORDER — OXYCODONE HCL 5 MG PO TABS: 5.0000 mg | ORAL_TABLET | ORAL | 0 refills | Status: DC | PRN

## 2020-02-11 NOTE — Plan of Care (Signed)

## 2020-02-11 NOTE — Discharge Instructions (Signed)
Appendicitis, Adult  The appendix is a tube in the body that is shaped like a finger. It is attached to the large intestine. Appendicitis means that this tube is swollen (inflamed). If this is not treated, the tube can tear (rupture). This can lead to a life-threatening infection. This condition can also cause pus to build up in the appendix (abscess). What are the causes? This condition may be caused by something that blocks the appendix. These include:  A ball of poop (stool).  Lymph glands that are bigger than normal. Sometimes the cause is not known. What increases the risk? You are more likely to develop this condition if you are between 9 and 80 years of age. What are the signs or symptoms? Symptoms of this condition include:  Pain around the belly button (navel). ? The pain moves toward the lower right belly (abdomen). ? The pain can get worse with time. ? The pain can get worse if you cough. ? The pain can get worse if you move suddenly.  Tenderness in the lower right belly.  Feeling sick to your stomach (nauseous).  Throwing up (vomiting).  Not feeling hungry (loss of appetite).  A fever.  Having trouble pooping (constipation).  Watery poop (diarrhea).  Not feeling well. How is this treated? Most often, this condition is treated by taking out the appendix (appendectomy). There are two ways to do this:  Open surgery. For this method, the appendix is taken out through a large cut (incision). The cut is made in the lower right belly. This surgery may be used if: ? You have scars from another surgery. ? You have a bleeding condition. ? You are pregnant and will be having your baby soon. ? You have a condition that makes it hard to do the other type of surgery.  Laparoscopic surgery. For this method, the appendix is taken out through small cuts. Often, this surgery: ? Causes less pain. ? Causes fewer problems. ? Is easier to heal from. If your appendix tears  and pus forms:  A drain may be put into the sore. The drain will be used to get rid of the pus.  You may get an antibiotic medicine through an IV line.  Your appendix may or may not need to be taken out. Follow these instructions at home: If you had surgery, follow instructions from your doctor on how to care for yourself at home and how to take care of your cut from surgery. Medicines  Take over-the-counter and prescription medicines only as told by your doctor.  If you were prescribed an antibiotic medicine, take it as told by your doctor. Do not stop taking the antibiotic even if you start to feel better. Eating and drinking Follow instructions from your doctor about what you cannot eat or drink. You may go back to your diet slowly if:  You no longer feel sick to your stomach.  You have stopped throwing up. General instructions  Do not use any products that contain nicotine or tobacco, such as cigarettes, e-cigarettes, and chewing tobacco. If you need help quitting, ask your doctor.  Do not drive or use heavy machinery while taking prescription pain medicine.  Ask your doctor if the medicine you are taking can cause trouble pooping. You may need to take steps to prevent or treat trouble pooping: ? Drink enough fluid to keep your pee (urine) pale yellow. ? Take over-the-counter or prescription medicines. ? Eat foods that are high in fiber. These include  beans, whole grains, and fresh fruits and vegetables. ? Limit foods that are high in fat and sugar. These include fried or sweet foods.  Keep all follow-up visits as told by your doctor. This is important. Contact a doctor if:  There is pus, blood, or a lot of fluid coming from your cut or cuts from surgery.  You are sick to your stomach or you throw up. Get help right away if:  You have pain in your belly, and the pain is getting worse.  You have a fever.  You have chills.  You are very tired.  You have muscle  pain.  You are short of breath. Summary  Appendicitis is swelling of the appendix. The appendix is a tube that is shaped like a finger. It is joined to the large intestine.  This condition may be caused by something that blocks the appendix. This can lead to an infection.  This condition is usually treated by taking out the appendix. This information is not intended to replace advice given to you by your health care provider. Make sure you discuss any questions you have with your health care provider. Document Revised: 01/07/2018 Document Reviewed: 01/07/2018 Elsevier Patient Education  2020 [employer]   Laparoscopic Appendectomy, Adult, Care After This sheet gives you information about how to care for yourself after your procedure. Your doctor may also give you more specific instructions. If you have problems or questions, contact your doctor. What can I expect after the procedure? After the procedure, it is common to have:  Little energy for normal activities.  Mild pain in the area where the cuts from surgery (incisions) were made.  Trouble pooping (constipation). This can be caused by: ? Pain medicine. ? A lack of activity. Follow these instructions at home: Medicines  Take over-the-counter and prescription medicines only as told by your doctor.  If you were prescribed an antibiotic medicine, take it as told by your doctor. Do not stop taking it even if you start to feel better.  Do not drive or use heavy machinery while taking prescription pain medicine.  Ask your doctor if the medicine you are taking can cause trouble pooping. You may need to take steps to prevent or treat trouble pooping: ? Drink enough fluid to keep your pee (urine) pale yellow. ? Take over-the-counter or prescription medicines. ? Eat foods that are high in fiber. These include beans, whole grains, and fresh fruits and vegetables. ? Limit foods that are high in fat and sugar. These include fried or  sweet foods. Incision care   Follow instructions from your doctor about how to take care of your cuts from surgery. Make sure you: ? Wash your hands with soap and water before and after you change your bandage (dressing). If you cannot use soap and water, use hand sanitizer. ? Change your bandage as told by your doctor. ? Leave stitches (sutures), skin glue, or skin tape (adhesive) strips in place. They may need to stay in place for 2 weeks or longer. If tape strips get loose and curl up, you may trim the loose edges. Do not remove tape strips completely unless your doctor says it is okay.  Check your cuts from surgery every day for signs of infection. Check for: ? Redness, swelling, or pain. ? Fluid or blood. ? Warmth. ? Pus or a bad smell. Bathing  Keep your cuts from surgery clean and dry. Clean them as told by your doctor. To do this: 1. Gently  wash the cuts with soap and water. 2. Rinse the cuts with water to remove all soap. 3. Pat the cuts dry with a clean towel. Do not rub the cuts.  Do not take baths, swim, or use a hot tub for 2 weeks, or until your doctor says it is okay. You may take showers after 48 hours. Activity   Do not drive for 24 hours if you were given a medicine to help you relax (sedative) during your procedure.  Rest after the procedure. Return to your normal activities as told by your doctor. Ask your doctor what activities are safe for you.  For 3 weeks, or for as long as told by your doctor: ? Do not lift anything that is heavier than 10 lb (4.5 kg), or the limit that you are told. ? Do not play contact sports. General instructions  If you were sent home with a drain, follow instructions from your doctor on how to care for it.  Take deep breaths. This helps to keep your lungs from getting an infection (pneumonia).  Keep all follow-up visits as told by your doctor. This is important. Contact a doctor if:  You have redness, swelling, or pain around a  cut from surgery.  You have fluid or blood coming from a cut.  Your cut feels warm to the touch.  You have pus or a bad smell coming from a cut or a bandage.  The edges of a cut break open after the stitches have been taken out.  You have pain in your shoulders that gets worse.  You feel dizzy or you pass out (faint).  You have shortness of breath.  You keep feeling sick to your stomach (nauseous).  You keep throwing up (vomiting).  You get watery poop (diarrhea) or you cannot control your poop.  You lose your appetite.  You have swelling or pain in your legs.  You get a rash. Get help right away if:  You have a fever.  You have trouble breathing.  You have sharp pains in your chest. Summary  After the procedure, it is common to have low energy, mild pain, and trouble pooping.  Infection is a common problem after this procedure. Follow your doctor's instructions about caring for yourself after the procedure.  Rest after the procedure. Return to your normal activities as told by your doctor.  Contact your doctor if you see signs of infection around your cuts from surgery, or you get short of breath. Get help right away if you have a fever, chest pain, or trouble breathing. This information is not intended to replace advice given to you by your health care provider. Make sure you discuss any questions you have with your health care provider. Document Revised: 01/22/2018 Document Reviewed: 01/22/2018 Elsevier Patient Education  2020 [employer]   Laparoscopic Appendectomy, Adult, Care After This sheet gives you information about how to care for yourself after your procedure. Your health care provider may also give you more specific instructions. If you have problems or questions, contact your health care provider. What can I expect after the procedure? After the procedure, it is common to have:  Little energy for normal activities.  Mild pain in the area where  the incisions were made.  Difficulty passing stool (constipation). This can be caused by: ? Pain medicine. ? A decrease in your activity. Follow these instructions at home: Medicines  Take over-the-counter and prescription medicines only as told by your health care provider.  If you were prescribed an antibiotic medicine, take it as told by your health care provider. Do not stop taking the antibiotic even if you start to feel better.  Do not drive or use heavy machinery while taking prescription pain medicine.  Ask your health care provider if the medicine prescribed to you can cause constipation. You may need to take steps to prevent or treat constipation, such as: ? Drink enough fluid to keep your urine pale yellow. ? Take over-the-counter or prescription medicines. ? Eat foods that are high in fiber, such as beans, whole grains, and fresh fruits and vegetables. ? Limit foods that are high in fat and processed sugars, such as fried or sweet foods. Incision care   Follow instructions from your health care provider about how to take care of your incisions. Make sure you: ? Wash your hands with soap and water before and after you change your bandage (dressing). If soap and water are not available, use hand sanitizer. ? Change your dressing as told by your health care provider. ? Leave stitches (sutures), skin glue, or adhesive strips in place. These skin closures may need to stay in place for 2 weeks or longer. If adhesive strip edges start to loosen and curl up, you may trim the loose edges. Do not remove adhesive strips completely unless your health care provider tells you to do that.  Check your incision areas every day for signs of infection. Check for: ? Redness, swelling, or pain. ? Fluid or blood. ? Warmth. ? Pus or a bad smell. Bathing  Keep your incisions clean and dry. Clean them as often as told by your health care provider. To do this: 1. Gently wash the incisions with  soap and water. 2. Rinse the incisions with water to remove all soap. 3. Pat the incisions dry with a clean towel. Do not rub the incisions.  Do not take baths, swim, or use a hot tub for 2 weeks, or until your health care provider approves. You may take showers after 48 hours. Activity   Do not drive for 24 hours if you were given a sedative during your procedure.  Rest after the procedure. Return to your normal activities as told by your health care provider. Ask your health care provider what activities are safe for you.  For 3 weeks, or for as long as told by your health care provider: ? Do not lift anything that is heavier than 10 lb (4.5 kg), or the limit that you are told. ? Do not play contact sports. General instructions  If you were sent home with a drain, follow instructions from your health care provider about how to care for it.  Take deep breaths. This helps to prevent your lungs from developing an infection (pneumonia).  Keep all follow-up visits as told by your health care provider. This is important. Contact a health care provider if:  You have redness, swelling, or pain around an incision.  You have fluid or blood coming from an incision.  Your incision feels warm to the touch.  You have pus or a bad smell coming from an incision or dressing.  Your incision edges break open after your sutures have been removed.  You have increasing pain in your shoulders.  You feel dizzy or you faint.  You develop shortness of breath.  You keep feeling nauseous or you are vomiting.  You have diarrhea or you cannot control your bowel functions.  You lose your appetite.  You  develop swelling or pain in your legs.  You develop a rash. Get help right away if you have:  A fever.  Difficulty breathing.  Sharp pains in your chest. Summary  After a laparoscopic appendectomy, it is common to have little energy for normal activities, mild pain in the area of the  incisions, and constipation.  Infection is the most common complication after this procedure. Follow your health care provider's instructions about caring for yourself after the procedure.  Rest after the procedure. Return to your normal activities as told by your health care provider.  Contact your health care provider if you notice signs of infection around your incisions or you develop shortness of breath. Get help right away if you have a fever, chest pain, or difficulty breathing. This information is not intended to replace advice given to you by your health care provider. Make sure you discuss any questions you have with your health care provider. Document Revised: 01/22/2018 Document Reviewed: 01/22/2018 Elsevier Patient Education  2020 [employer]. [facility] LAPAROSCOPIC SURGERY: POST OP INSTRUCTIONS Always review your discharge instruction sheet given to you by the facility where your surgery was performed. IF YOU HAVE DISABILITY OR FAMILY LEAVE FORMS, YOU MUST BRING THEM TO THE OFFICE FOR PROCESSING.   DO NOT GIVE THEM TO YOUR DOCTOR.  PAIN CONTROL  1. First take acetaminophen (Tylenol) AND/or ibuprofen (Advil) to control your pain after surgery.  Follow directions on package.  Taking acetaminophen (Tylenol) and/or ibuprofen (Advil) regularly after surgery will help to control your pain and lower the amount of prescription pain medication you may need.  You should not take more than 3,000 mg (3 grams) of acetaminophen (Tylenol) in 24 hours.  You should not take ibuprofen (Advil), aleve, motrin, naprosyn or other NSAIDS if you have a history of stomach ulcers or chronic kidney disease.  2. A prescription for pain medication may be given to you upon discharge.  Take your pain medication as prescribed, if you still have uncontrolled pain after taking acetaminophen (Tylenol) or ibuprofen (Advil). 3. Use ice packs to help control pain. 4. If you need a refill on  your pain medication, please contact your pharmacy.  They will contact our office to request authorization. Prescriptions will not be filled after 5pm or on week-ends.  HOME MEDICATIONS 5. Take your usually prescribed medications unless otherwise directed.  DIET 6. You should follow a light diet the first few days after arrival home.  Be sure to include lots of fluids daily. Avoid fatty, fried foods.   CONSTIPATION 7. It is common to experience some constipation after surgery and if you are taking pain medication.  Increasing fluid intake and taking a stool softener (such as Colace) will usually help or prevent this problem from occurring.  A mild laxative (Milk of Magnesia or Miralax) should be taken according to package instructions if there are no bowel movements after 48 hours.  WOUND/INCISION CARE 8. Most patients will experience some swelling and bruising in the area of the incisions.  Ice packs will help.  Swelling and bruising can take several days to resolve.  9. Unless discharge instructions indicate otherwise, follow guidelines below  a. STERI-STRIPS - you may remove your outer bandages 48 hours after surgery, and you may shower at that time.  You have steri-strips (small skin tapes) in place directly over the incision.  These strips should be left on the skin for 7-10 days.   b. DERMABOND/SKIN GLUE - you may  shower in 24 hours.  The glue will flake off over the next 2-3 weeks. 10. Any sutures or staples will be removed at the office during your follow-up visit.  ACTIVITIES 11. You may resume regular (light) daily activities beginning the next day--such as daily self-care, walking, climbing stairs--gradually increasing activities as tolerated.  You may have sexual intercourse when it is comfortable.  Refrain from any heavy lifting or straining until approved by your doctor. a. You may drive when you are no longer taking prescription pain medication, you can comfortably wear a seatbelt,  and you can safely maneuver your car and apply brakes.  FOLLOW-UP 12. You should see your doctor in the office for a follow-up appointment approximately 2-3 weeks after your surgery.  You should have been given your post-op/follow-up appointment when your surgery was scheduled.  If you did not receive a post-op/follow-up appointment, make sure that you call for this appointment within a day or two after you arrive home to insure a convenient appointment time.   WHEN TO CALL YOUR DOCTOR: 1. Fever over 101.0 2. Inability to urinate 3. Continued bleeding from incision. 4. Increased pain, redness, or drainage from the incision. 5. Increasing abdominal pain  The clinic staff is available to answer your questions during regular business hours.  Please don't hesitate to call and ask to speak to one of the nurses for clinical concerns.  If you have a medical emergency, go to the nearest emergency room or call 911.  A surgeon from [facility] is always on call at the hospital. [address], [place], [place]  [address] ? [address] [address], [place], [place]   [address] [phone] ? [phone] ? FAX [phone] Web site: [email or web address]  ........Marland Kitchen   Managing Your Pain After Surgery Without Opioids    Thank you for participating in our program to help patients manage their pain after surgery without opioids. This is part of our effort to provide you with the best care possible, without exposing you or your family to the risk that opioids pose.  What pain can I expect after surgery? You can expect to have some pain after surgery. This is normal. The pain is typically worse the day after surgery, and quickly begins to get better. Many studies have found that many patients are able to manage their pain after surgery with Over-the-Counter (OTC) medications such as Tylenol and Motrin. If you have a condition that does not allow you to take Tylenol or Motrin,  notify your surgical team.  How will I manage my pain? The best strategy for controlling your pain after surgery is around the clock pain control with Tylenol (acetaminophen) and Motrin (ibuprofen or Advil). Alternating these medications with each other allows you to maximize your pain control. In addition to Tylenol and Motrin, you can use heating pads or ice packs on your incisions to help reduce your pain.  How will I alternate your regular strength over-the-counter pain medication? You will take a dose of pain medication every three hours. ; Start by taking 650 mg of Tylenol (2 pills of 325 mg) ; 3 hours later take 600 mg of Motrin (3 pills of 200 mg) ; 3 hours after taking the Motrin take 650 mg of Tylenol ; 3 hours after that take 600 mg of Motrin.   - 1 -  See example - if your first dose of Tylenol is at 12:00 PM   12:00 PM Tylenol 650 mg (2 pills of  325 mg)  3:00 PM Motrin 600 mg (3 pills of 200 mg)  6:00 PM Tylenol 650 mg (2 pills of 325 mg)  9:00 PM Motrin 600 mg (3 pills of 200 mg)  Continue alternating every 3 hours   We recommend that you follow this schedule around-the-clock for at least 3 days after surgery, or until you feel that it is no longer needed. Use the table on the last page of this handout to keep track of the medications you are taking. Important: Do not take more than  of Tylenol or  of Motrin in a 24-hour period. Do not take ibuprofen/Motrin if you have a history of bleeding stomach ulcers, severe kidney disease, &/or actively taking a blood thinner  What if I still have pain? If you have pain that is not controlled with the over-the-counter pain medications (Tylenol and Motrin or Advil) you might have what we call "breakthrough" pain. You will receive a prescription for a small amount of an opioid pain medication such as Oxycodone, Tramadol, or Tylenol with Codeine. Use these opioid pills in the first 24 hours after surgery if you have  breakthrough pain. Do not take more than 1 pill every 4-6 hours.  If you still have uncontrolled pain after using all opioid pills, don't hesitate to call our staff using the number provided. We will help make sure you are managing your pain in the best way possible, and if necessary, we can provide a prescription for additional pain medication.   Day 1    Time  Name of Medication Number of pills taken  Amount of Acetaminophen  Pain Level   Comments  AM PM       AM PM       AM PM       AM PM       AM PM       AM PM       AM PM       AM PM       Total Daily amount of Acetaminophen Do not take more than  3,000 mg per day      Day 2    Time  Name of Medication Number of pills taken  Amount of Acetaminophen  Pain Level   Comments  AM PM       AM PM       AM PM       AM PM       AM PM       AM PM       AM PM       AM PM       Total Daily amount of Acetaminophen Do not take more than  3,000 mg per day      Day 3    Time  Name of Medication Number of pills taken  Amount of Acetaminophen  Pain Level   Comments  AM PM       AM PM       AM PM       AM PM          AM PM       AM PM       AM PM       AM PM       Total Daily amount of Acetaminophen Do not take more than  3,000 mg per day      Day 4    Time  Name of Medication Number  of pills taken  Amount of Acetaminophen  Pain Level   Comments  AM PM       AM PM       AM PM       AM PM       AM PM       AM PM       AM PM       AM PM       Total Daily amount of Acetaminophen Do not take more than  3,000 mg per day      Day 5    Time  Name of Medication Number of pills taken  Amount of Acetaminophen  Pain Level   Comments  AM PM       AM PM       AM PM       AM PM       AM PM       AM PM       AM PM       AM PM       Total Daily amount of Acetaminophen Do not take more than  3,000 mg per day       Day 6    Time  Name of Medication Number of pills taken  Amount of  Acetaminophen  Pain Level  Comments  AM PM       AM PM       AM PM       AM PM       AM PM       AM PM       AM PM       AM PM       Total Daily amount of Acetaminophen Do not take more than  3,000 mg per day      Day 7    Time  Name of Medication Number of pills taken  Amount of Acetaminophen  Pain Level   Comments  AM PM       AM PM       AM PM       AM PM       AM PM       AM PM       AM PM       AM PM       Total Daily amount of Acetaminophen Do not take more than  3,000 mg per day        For additional information about how and where to safely dispose of unused opioid medications - [email or web address]  Disclaimer: This document contains information and/or instructional materials adapted from [facility] for the typical patient with your condition. It does not replace medical advice from your health care provider because your experience may differ from that of the typical patient. Talk to your health care provider if you have any questions about this document, your condition or your treatment plan. Adapted from [facility]

## 2020-02-11 NOTE — Discharge Summary (Signed)
[facility] Discharge Summary   Patient ID: Jocelyn Waters MRN: [id number] DOB/AGE: 10-09-1980 39 y.o.  Admit date: 02/10/2020 Discharge date: 02/11/2020  Admitting Diagnosis: appendicitis  Discharge Diagnosis Patient Active Problem List   Diagnosis Date Noted  . Appendicitis, acute 02/10/2020  . Acute appendicitis 02/10/2020  . HTN (hypertension) 12/03/2016  . Hyperemesis affecting pregnancy, antepartum 06/03/2016  . Normal vaginal delivery 12/09/2014  . Pregnancy 12/06/2014  . Pregnancy induced hypertension, antepartum   . Preeclampsia 11/22/2014  . Pyelonephritis affecting pregnancy in second trimester, antepartum 08/19/2014  . Reflux   . Bipolar disorder (HCC)   . ADD (attention deficit disorder)   . ASCUS (atypical squamous cells of undetermined significance) on Pap smear     Consultants None   Imaging: CT ABDOMEN PELVIS W CONTRAST  Result Date: 02/10/2020 CLINICAL DATA:  Right lower quadrant pain EXAM: CT ABDOMEN AND PELVIS WITH CONTRAST TECHNIQUE: Multidetector CT imaging of the abdomen and pelvis was performed using the standard protocol following bolus administration of intravenous contrast. CONTRAST:  OMNIPAQUE IOHEXOL 300 MG/ML  SOLN COMPARISON:  Ultrasound 11/19/2019 FINDINGS: Lower chest: Lung bases demonstrate no acute consolidation or effusion. Heart size is normal. Hepatobiliary: No focal liver abnormality is seen. Status post cholecystectomy. No biliary dilatation. Pancreas: Unremarkable. No pancreatic ductal dilatation or surrounding inflammatory changes. Spleen: Normal in size without focal abnormality. Adrenals/Urinary Tract: Adrenal glands are normal. No hydronephrosis. 2-3 mm stone lower pole right kidney. At least 2 stones in the mid to upper left kidney measuring up to 5 mm in size. Urinary bladder is unremarkable. Stomach/Bowel: Stomach is nonenlarged. No dilated small bowel. Appendix slightly enlarged measuring up to 9 mm. No intraluminal  stones. The tip is of normal caliber. Mild fat stranding in the fat surrounding the slightly dilated segment of the appendix. No extraluminal gas. Vascular/Lymphatic: No significant vascular findings are present. No enlarged abdominal or pelvic lymph nodes. Reproductive: Uterus and bilateral adnexa are unremarkable. Other: No abdominal wall hernia or abnormality. No abdominopelvic ascites. Musculoskeletal: No acute or significant osseous findings. IMPRESSION: 1. Slightly enlarged appendix measuring up to 9 mm with mild fat stranding/inflammation in the fat surrounding the slightly dilated segment of the appendix. Findings could be secondary to early or mild appendicitis. No perforation or abscess. Appendix: Location: Right lower quadrant Diameter: 9 mm Appendicolith: Negative Mucosal hyper-enhancement: Negative Extraluminal gas: Negative Periappendiceal collection: Negative 2.      Bilateral nonobstructing kidney stones. Electronically Signed   By: Jasmine Pang M.D.   On: 02/10/2020 17:35    Procedures Dr. Gerrit Friends (02/10/20) - Laparoscopic Appendectomy  Hospital Course:  Patient is a 39 year old female who presented to [facility] with abdominal pain.  Workup showed acute appendicitis.  Patient was admitted and underwent procedure listed above.  Tolerated procedure well and was transferred to the floor.  Diet was advanced as tolerated.  On POD#1, the patient was voiding well, tolerating diet, ambulating well, pain well controlled, vital signs stable, incisions c/d/i and felt stable for discharge home.  Patient will follow up in our office in 3 weeks and knows to call with questions or concerns. She will call to confirm appointment date/time.    Physical Exam: General:  Alert, NAD, pleasant, comfortable Abd:  Soft, ND, mild tenderness, incisions C/D/I  I or a member of my team have reviewed this patient in the Controlled Substance Database.   Allergies as of 02/11/2020      Reactions   Biaxin [clarithromycin]  Rash   Septra [  bactrim] Rash   Sulfa Antibiotics Rash      Medication List    STOP taking these medications   traMADol 50 MG tablet Commonly known as: ULTRAM     TAKE these medications   acetaminophen 325 MG tablet Commonly known as: TYLENOL Take 2 tablets (650 mg total) by mouth every 6 (six) hours as needed for mild pain (or Fever >/= 101).   ALPRAZolam 0.5 MG tablet Commonly known as: XANAX Take 0.5 mg by mouth at bedtime as needed for anxiety.   cetirizine 10 MG tablet Commonly known as: ZYRTEC Take 10 mg by mouth daily.   chlorhexidine 0.12 % solution Commonly known as: PERIDEX Use as directed 5 mLs in the mouth or throat 2 (two) times daily.   FLUoxetine 40 MG capsule Commonly known as: PROZAC Take 40 mg by mouth daily.   fluticasone 50 MCG/ACT nasal spray Commonly known as: FLONASE Place 1 spray into both nostrils daily as needed for allergies or rhinitis.   multivitamin-prenatal 27-0.8 MG Tabs tablet Take 1 tablet by mouth daily at 12 noon.   ondansetron 8 MG disintegrating tablet Commonly known as: ZOFRAN-ODT Take 8 mg by mouth every 8 (eight) hours as needed for nausea or vomiting.   oxyCODONE 5 MG immediate release tablet Commonly known as: Oxy IR/ROXICODONE Take 1 tablet (5 mg total) by mouth every 4 (four) hours as needed for moderate pain. What changed:   when to take this  reasons to take this   phentermine 37.5 MG tablet Commonly known as: ADIPEX-P Take 37.5 mg by mouth daily before breakfast.   SF 5000 Plus 1.1 % Crea dental cream Generic drug: sodium fluoride Take 1 application by mouth at bedtime as needed (dental health).         Follow-up Information    [facility]. Go on 03/02/2020.   Specialty: General Surgery Why: Follow up appointment scheduled for 2:45 PM. Please arrive 30 min prior to appointment time. Bring photo ID and insurance information.  Contact information: [address] [place]  [address] [phone]               Signed: Wells Guiles, [facility] 02/11/2020, 10:54 AM Please see Amion for pager number during day hours 7:00am-4:30pm

## 2020-02-12 LAB — URINE CULTURE

## 2020-02-14 LAB — SURGICAL PATHOLOGY

## 2020-02-14 NOTE — Anesthesia Postprocedure Evaluation (Signed)
Anesthesia Post Note  Patient: Jocelyn Waters  Procedure(s) Performed: APPENDECTOMY LAPAROSCOPIC (N/A Abdomen)     Patient location during evaluation: PACU Anesthesia Type: General Level of consciousness: awake and alert Pain management: pain level controlled Vital Signs Assessment: post-procedure vital signs reviewed and stable Respiratory status: spontaneous breathing, nonlabored ventilation, respiratory function stable and patient connected to nasal cannula oxygen Cardiovascular status: blood pressure returned to baseline and stable Postop Assessment: no apparent nausea or vomiting Anesthetic complications: no   No complications documented.  Last Vitals:  Vitals:   02/11/20 0355 02/11/20 0756  BP: 107/69 104/71  Pulse: 81 82  Resp: 13 18  Temp: 36.7 C 36.7 C  SpO2: 95% 96%    Last Pain:  Vitals:   02/11/20 0900  TempSrc:   PainSc: 7                  Shelton Silvas

## 2020-06-15 ENCOUNTER — Other Ambulatory Visit: Payer: Self-pay

## 2020-06-15 ENCOUNTER — Encounter: Payer: Self-pay | Admitting: Adult Health

## 2020-06-15 ENCOUNTER — Ambulatory Visit (INDEPENDENT_AMBULATORY_CARE_PROVIDER_SITE_OTHER): Payer: 59 | Admitting: Adult Health

## 2020-06-15 VITALS — BP 126/80 | HR 77 | Ht 63.0 in | Wt 159.0 lb

## 2020-06-15 DIAGNOSIS — F4010 Social phobia, unspecified: Secondary | ICD-10-CM | POA: Diagnosis not present

## 2020-06-15 DIAGNOSIS — F909 Attention-deficit hyperactivity disorder, unspecified type: Secondary | ICD-10-CM

## 2020-06-15 DIAGNOSIS — F411 Generalized anxiety disorder: Secondary | ICD-10-CM

## 2020-06-15 DIAGNOSIS — F410 Panic disorder [episodic paroxysmal anxiety] without agoraphobia: Secondary | ICD-10-CM | POA: Diagnosis not present

## 2020-06-15 DIAGNOSIS — F419 Anxiety disorder, unspecified: Secondary | ICD-10-CM | POA: Insufficient documentation

## 2020-06-15 DIAGNOSIS — O139 Gestational [pregnancy-induced] hypertension without significant proteinuria, unspecified trimester: Secondary | ICD-10-CM | POA: Insufficient documentation

## 2020-06-15 MED ORDER — AMPHETAMINE-DEXTROAMPHET ER 20 MG PO CP24: 20.0000 mg | ORAL_CAPSULE | Freq: Every day | ORAL | 0 refills | Status: DC

## 2020-06-15 NOTE — Progress Notes (Signed)
[facility] MD/PA/NP Initial Note  06/15/2020 8:54 AM Jocelyn Waters  MRN:  [id number]  Chief Complaint:   HPI:   Describes mood today as "ok". Pleasant. Mood symptoms - denies depression, anxiety, and irritability. Stating "my mood is good". Taking Prozac 40mg  daily since March of 2021. Feels like adding it back again was helpful. Using Xanax occasionally for increased anxiety and sleep. Has panic attacks occasionally. Reports social anxiety. Had a difficult time getting back out in social settings once Covid restrictions lifted. Feels like she struggles with attention issues and completing tasks. Stating "I feel like I'm all over the place. Stable interest and motivation. Taking medications as prescribed.  Energy levels improving. Active, Exercising more. Enjoys some usual interests and activities. Married. Lives with husband of 8 years and 2 children - 5 and 3. Sister local. Parents 2 hours away. Spending time with family. Appetite adequate. Weight gain 6 pounds since July - 159 pounds. Sleeps well most nights. Averages 6 to 7 hours. Focus and concentration difficulties. Diagnosed with ADHD previously. Stopped medication when she had children and was breastfeeding. Completing tasks. Managing aspects of household. Works for the [employer]. Denies SI or HI.  Denies AH or VH.  Previous medication trials: Xanax, Adderall XR, Lorazepam,   Visit Diagnosis:    ICD-10-CM   1. Generalized anxiety disorder  F41.1   2. Attention deficit hyperactivity disorder (ADHD), unspecified ADHD type  F90.9   3. Social anxiety disorder  F40.10   4. Panic attacks  F41.0     Past Psychiatric History: Denies psychiatric hospitalization.  Past Medical History:  Past Medical History:  Diagnosis Date   ADD (attention deficit disorder)    Anxiety    Depression    History of abnormal cervical Pap smear 2007   History of kidney stones 2016   History of pregnancy induced hypertension    Hx of  varicella    PONV (postoperative nausea and vomiting)     Past Surgical History:  Procedure Laterality Date   CHOLECYSTECTOMY N/A 12/16/2019   Procedure: LAPAROSCOPIC CHOLECYSTECTOMY;  Surgeon: 12/18/2019, MD;  Location: [facility];  Service: General;  Laterality: N/A;  GENERAL WITH BILATERAL TAP BLOCK   DILATION AND EVACUATION N/A 10/13/2019   Procedure: SUCTION  DILATATION AND EVACUATION WITH CHROMOSOME STUDIES;  Surgeon: 12/13/2019, MD;  Location: [facility];  Service: Gynecology;  Laterality: N/A;   KNEE ARTHROSCOPY Right 2000   LAPAROSCOPIC APPENDECTOMY N/A 02/10/2020   Procedure: APPENDECTOMY LAPAROSCOPIC;  Surgeon: 04/12/2020, MD;  Location: [facility];  Service: General;  Laterality: N/A;   THERAPEUTIC ABORTION  05/2006   WISDOM TOOTH EXTRACTION  [age]    Family Psychiatric History: Family members with mental health issues.  Family History:  Family History  Problem Relation Age of Onset   Diabetes Maternal Aunt    Hypertension Maternal Grandmother    Hypertension Paternal Grandmother    Diabetes Paternal Grandfather     Social History:  Social History   Socioeconomic History   Marital status: Married    Spouse name: Not on file   Number of children: Not on file   Years of education: Not on file   Highest education level: Not on file  Occupational History   Not on file  Tobacco Use   Smoking status: Former Smoker    Years: 15.00    Quit date: 08/31/2012    Years since quitting: 7.7   Smokeless tobacco: Never Used  09/02/2012  Vaping Use: Former   Quit date: 10/11/2012  Substance and Sexual Activity   Alcohol use: Not Currently    Comment: SOCIALY   Drug use: No   Sexual activity: Yes    Partners: Male    Birth control/protection: None  Other Topics Concern   Not on file  Social History Narrative   Not on file   Social Determinants of Health   Financial Resource Strain:    Difficulty of Paying  Living Expenses: Not on file  Food Insecurity:    Worried About Programme researcher, broadcasting/film/video in the Last Year: Not on file   [employer] of Food in the Last Year: Not on file  Transportation Needs:    Lack of Transportation (Medical): Not on file   Lack of Transportation (Non-Medical): Not on file  Physical Activity:    Days of Exercise per Week: Not on file   Minutes of Exercise per Session: Not on file  Stress:    Feeling of Stress : Not on file  Social Connections:    Frequency of Communication with Friends and Family: Not on file   Frequency of Social Gatherings with Friends and Family: Not on file   Attends Religious Services: Not on file   Active Member of Clubs or Organizations: Not on file   Attends Banker Meetings: Not on file   Marital Status: Not on file    Allergies:  Allergies  Allergen Reactions   Sulfamethoxazole-Trimethoprim    Biaxin [Clarithromycin] Rash   Septra [Bactrim] Rash   Sulfa Antibiotics Rash    Metabolic Disorder Labs: No results found for: HGBA1C, MPG Lab Results  Component Value Date   PROLACTIN 8.0 03/30/2014   Lab Results  Component Value Date   CHOL 175 07/30/2012   TRIG 186 (H) 07/30/2012   HDL 48 07/30/2012   CHOLHDL 3.6 07/30/2012   VLDL 37 07/30/2012   LDLCALC 90 07/30/2012   Lab Results  Component Value Date   TSH 2.119 03/30/2014   TSH 1.600 07/30/2012    Therapeutic Level Labs: No results found for: LITHIUM No results found for: VALPROATE No components found for:  CBMZ  Current Medications: Current Outpatient Medications  Medication Sig Dispense Refill   ALPRAZolam (XANAX) 0.5 MG tablet Take 0.5 mg by mouth at bedtime as needed for anxiety.     cetirizine (ZYRTEC) 10 MG tablet Take 10 mg by mouth daily.     FLUoxetine (PROZAC) 40 MG capsule Take 40 mg by mouth daily. (Patient not taking: Reported on 02/10/2020)     No current facility-administered medications for this visit.    Medication  Side Effects: none  Orders placed this visit:  No orders of the defined types were placed in this encounter.   Psychiatric Specialty Exam:  Review of Systems  Musculoskeletal: Negative for gait problem.  Neurological: Negative for tremors.  Psychiatric/Behavioral:       Please refer to HPI    Blood pressure 126/80, pulse 77, height 5\' 3"  (1.6 m), weight 159 lb (72.1 kg).Body mass index is 28.17 kg/m.  General Appearance: Casual, Neat and Well Groomed  Eye Contact:  Good  Speech:  Clear and Coherent and Normal Rate  Volume:  Normal  Mood:  Euthymic  Affect:  Appropriate and Congruent  Thought Process:  Coherent and Descriptions of Associations: Intact  Orientation:  Full (Time, Place, and Person)  Thought Content: Logical   Suicidal Thoughts:  No  Homicidal Thoughts:  No  Memory:  WNL  Judgement:  Good  Insight:  Good  Psychomotor Activity:  Normal  Concentration:  Concentration: Good  Recall:  Good  Fund of Knowledge: Good  Language: Good  Assets:  Communication Skills Desire for Improvement Financial Resources/Insurance Housing Intimacy Leisure Time Physical Health Resilience Social Support Talents/Skills Transportation Vocational/Educational  ADL's:  Intact  Cognition: WNL  Prognosis:  Good   Screenings: MDQ  Receiving Psychotherapy: No   Treatment Plan/Recommendations:   Plan:  PDMP reviewed  1. Continue Prozac 40mg  daily 2. Continue Xanax 0.5mg  BID prn anxiety/panic attacks 3. Add Adderall XR 20mg   Read and reviewed note with patient for accuracy.   RTC 4 weeks  Patient advised to contact office with any questions, adverse effects, or acute worsening in signs and symptoms.  Discussed potential benefits, risk, and side effects of benzodiazepines to include potential risk of tolerance and dependence, as well as possible drowsiness.  Advised patient not to drive if experiencing drowsiness and to take lowest possible effective dose to minimize  risk of dependence and tolerance.  Discussed potential benefits, risks, and side effects of stimulants with patient to include increased heart rate, palpitations, insomnia, increased anxiety, increased irritability, or decreased appetite.  Instructed patient to contact office if experiencing any significant tolerability issues.      , NP

## 2020-07-13 ENCOUNTER — Inpatient Hospital Stay (HOSPITAL_COMMUNITY)
Admission: AD | Admit: 2020-07-13 | Discharge: 2020-07-13 | Disposition: A | Discharge status: Home / Self Care | Payer: 59 | Attending: Obstetrics and Gynecology | Admitting: Obstetrics and Gynecology

## 2020-07-13 ENCOUNTER — Encounter (HOSPITAL_COMMUNITY): Payer: Self-pay | Admitting: Obstetrics and Gynecology

## 2020-07-13 ENCOUNTER — Other Ambulatory Visit: Payer: Self-pay

## 2020-07-13 ENCOUNTER — Ambulatory Visit: Payer: 59 | Admitting: Adult Health

## 2020-07-13 ENCOUNTER — Inpatient Hospital Stay (HOSPITAL_COMMUNITY): Payer: 59

## 2020-07-13 DIAGNOSIS — O26891 Other specified pregnancy related conditions, first trimester: Secondary | ICD-10-CM | POA: Insufficient documentation

## 2020-07-13 DIAGNOSIS — Z20822 Contact with and (suspected) exposure to covid-19: Secondary | ICD-10-CM | POA: Insufficient documentation

## 2020-07-13 DIAGNOSIS — E876 Hypokalemia: Secondary | ICD-10-CM | POA: Diagnosis not present

## 2020-07-13 DIAGNOSIS — R109 Unspecified abdominal pain: Secondary | ICD-10-CM | POA: Diagnosis not present

## 2020-07-13 DIAGNOSIS — Z3A01 Less than 8 weeks gestation of pregnancy: Secondary | ICD-10-CM | POA: Diagnosis not present

## 2020-07-13 DIAGNOSIS — K529 Noninfective gastroenteritis and colitis, unspecified: Secondary | ICD-10-CM | POA: Insufficient documentation

## 2020-07-13 DIAGNOSIS — O99611 Diseases of the digestive system complicating pregnancy, first trimester: Secondary | ICD-10-CM

## 2020-07-13 DIAGNOSIS — O99281 Endocrine, nutritional and metabolic diseases complicating pregnancy, first trimester: Secondary | ICD-10-CM | POA: Diagnosis not present

## 2020-07-13 DIAGNOSIS — R748 Abnormal levels of other serum enzymes: Secondary | ICD-10-CM

## 2020-07-13 DIAGNOSIS — Z87891 Personal history of nicotine dependence: Secondary | ICD-10-CM | POA: Insufficient documentation

## 2020-07-13 DIAGNOSIS — O26899 Other specified pregnancy related conditions, unspecified trimester: Secondary | ICD-10-CM | POA: Diagnosis present

## 2020-07-13 DIAGNOSIS — R945 Abnormal results of liver function studies: Secondary | ICD-10-CM

## 2020-07-13 LAB — COMPREHENSIVE METABOLIC PANEL
ALT: 91 U/L — ABNORMAL HIGH (ref 0–44)
AST: 81 U/L — ABNORMAL HIGH (ref 15–41)
Albumin: 3.7 g/dL (ref 3.5–5.0)
Alkaline Phosphatase: 56 U/L (ref 38–126)
Anion gap: 10 (ref 5–15)
BUN: 8 mg/dL (ref 6–20)
CO2: 22 mmol/L (ref 22–32)
Calcium: 8.7 mg/dL — ABNORMAL LOW (ref 8.9–10.3)
Chloride: 103 mmol/L (ref 98–111)
Creatinine, Ser: 0.57 mg/dL (ref 0.44–1.00)
GFR, Estimated: >60 mL/min (ref >60)
Glucose, Bld: 89 mg/dL (ref 70–99)
Potassium: 2.9 mmol/L — ABNORMAL LOW (ref 3.5–5.1)
Sodium: 135 mmol/L (ref 135–145)
Total Bilirubin: 0.8 mg/dL (ref 0.3–1.2)
Total Protein: 6.6 g/dL (ref 6.5–8.1)

## 2020-07-13 LAB — LIPASE, BLOOD: Lipase: 23 U/L (ref 11–51)

## 2020-07-13 LAB — URINALYSIS, ROUTINE W REFLEX MICROSCOPIC
Bacteria, UA: NONE SEEN
Bilirubin Urine: NEGATIVE
Glucose, UA: NEGATIVE mg/dL
Ketones, ur: NEGATIVE mg/dL
Nitrite: NEGATIVE
Protein, ur: 30 mg/dL — AB
Specific Gravity, Urine: 1.027 (ref 1.005–1.030)
pH: 6 (ref 5.0–8.0)

## 2020-07-13 LAB — HEPATITIS PANEL, ACUTE
HCV Ab: NONREACTIVE
Hep A IgM: NONREACTIVE
Hep B C IgM: NONREACTIVE
Hepatitis B Surface Ag: NONREACTIVE

## 2020-07-13 LAB — RESP PANEL BY RT-PCR (FLU A&B, COVID) ARPGX2
Influenza A by PCR: NEGATIVE
Influenza B by PCR: NEGATIVE
SARS Coronavirus 2 by RT PCR: NEGATIVE

## 2020-07-13 LAB — CBC
HCT: 36.2 % (ref 36.0–46.0)
Hemoglobin: 12.9 g/dL (ref 12.0–15.0)
MCH: 30.9 pg (ref 26.0–34.0)
MCHC: 35.6 g/dL (ref 30.0–36.0)
MCV: 86.8 fL (ref 80.0–100.0)
Platelets: 224 10*3/uL (ref 150–400)
RBC: 4.17 MIL/uL (ref 3.87–5.11)
RDW: 11.9 % (ref 11.5–15.5)
WBC: 7 10*3/uL (ref 4.0–10.5)
nRBC: 0 % (ref 0.0–0.2)

## 2020-07-13 LAB — C DIFFICILE QUICK SCREEN W PCR REFLEX
C Diff antigen: NEGATIVE
C Diff interpretation: NOT DETECTED
C Diff toxin: NEGATIVE

## 2020-07-13 LAB — POCT PREGNANCY, URINE: Preg Test, Ur: POSITIVE — AB

## 2020-07-13 LAB — FERRITIN: Ferritin: 77 ng/mL (ref 11–307)

## 2020-07-13 MED ORDER — POTASSIUM CHLORIDE 10 MEQ/100ML IV SOLN
10.0000 meq | INTRAVENOUS | Status: AC
  Administered 2020-07-13 (×2): 10 meq via INTRAVENOUS
  Filled 2020-07-13 (×2): qty 100

## 2020-07-13 MED ORDER — SODIUM CHLORIDE 0.9 % IV SOLN
8.0000 mg | Freq: Once | INTRAVENOUS | Status: DC
  Filled 2020-07-13: qty 4

## 2020-07-13 MED ORDER — PROMETHAZINE HCL 12.5 MG PO TABS
12.5000 mg | ORAL_TABLET | Freq: Four times a day (QID) | ORAL | 0 refills | Status: DC | PRN
Start: 2020-07-13 — End: 2020-11-16

## 2020-07-13 MED ORDER — LACTATED RINGERS IV BOLUS
1000.0000 mL | Freq: Once | INTRAVENOUS | Status: AC
  Administered 2020-07-13: 1000 mL via INTRAVENOUS

## 2020-07-13 MED ORDER — POTASSIUM CHLORIDE CRYS ER 20 MEQ PO TBCR: 20.0000 meq | EXTENDED_RELEASE_TABLET | Freq: Two times a day (BID) | ORAL | 0 refills | Status: DC

## 2020-07-13 MED ORDER — LOPERAMIDE HCL 2 MG PO CAPS
2.0000 mg | ORAL_CAPSULE | ORAL | Status: DC | PRN
  Administered 2020-07-13: 2 mg via ORAL
  Filled 2020-07-13: qty 1

## 2020-07-13 MED ORDER — PROMETHAZINE HCL 25 MG/ML IJ SOLN
25.0000 mg | Freq: Four times a day (QID) | INTRAMUSCULAR | Status: DC | PRN
  Administered 2020-07-13: 25 mg via INTRAVENOUS
  Filled 2020-07-13: qty 1

## 2020-07-13 NOTE — MAU Provider Note (Signed)
History     CSN: [id number]  Arrival date and time: 07/13/20 [id number]  Chief Complaint  Patient presents with  . Diarrhea  . Abdominal Pain   39 y.o. [id number] @[redacted]w[redacted]d  presenting with diarrhea, nausea, and weakness. Reports onset of sx 4 days ago. She has multiple watery stools daily. She is able to eat crackers and toast and drink water and Gatorade. She denies VB or LAP, has occasional intermittent upper abdominal pain. She denies sick contacts or fevers. Reports having a negative Covid test 2 days ago. Had US in office that showed IUP.   OB History    Gravida  6   Para  2   Term  2   Preterm  0   AB  3   Living  2     SAB  2   IAB  1   Ectopic  0   Multiple  0   Live Births  2           Past Medical History:  Diagnosis Date  . ADD (attention deficit disorder)   . Anxiety   . Depression   . History of abnormal cervical Pap smear 2007  . History of kidney stones 2016  . History of pregnancy induced hypertension   . Hx of varicella   . PONV (postoperative nausea and vomiting)     Past Surgical History:  Procedure Laterality Date  . CHOLECYSTECTOMY N/A 12/16/2019   Procedure: LAPAROSCOPIC CHOLECYSTECTOMY;  Surgeon: Emelia LoronWakefield, Matthew, MD;  Location: [facility];  Service: General;  Laterality: N/A;  GENERAL WITH BILATERAL TAP BLOCK  . DILATION AND EVACUATION N/A 10/13/2019   Procedure: SUCTION  DILATATION AND EVACUATION WITH CHROMOSOME STUDIES;  Surgeon: Essie HartPinn, Walda, MD;  [facility];  Service: Gynecology;  Laterality: N/A;  . KNEE ARTHROSCOPY Right 2000  . LAPAROSCOPIC APPENDECTOMY N/A 02/10/2020   Procedure: APPENDECTOMY LAPAROSCOPIC;  Surgeon: Darnell LevelGerkin, Todd, MD;  Location: [facility];  Service: General;  Laterality: N/A;  . THERAPEUTIC ABORTION  05/2006  . WISDOM TOOTH EXTRACTION  [age]    Family History  Problem Relation Age of Onset  . Diabetes Maternal Aunt   . Hypertension Maternal Grandmother   . Hypertension Paternal  Grandmother   . Diabetes Paternal Grandfather     Social History   Tobacco Use  . Smoking status: Former Smoker    Years: 15.00    Quit date: 08/31/2012    Years since quitting: 7.8  . Smokeless tobacco: Never Used  Vaping Use  . Vaping Use: Former  . Quit date: 10/11/2012  Substance Use Topics  . Alcohol use: Not Currently    Comment: SOCIALY  . Drug use: No    Allergies:  Allergies  Allergen Reactions  . Sulfamethoxazole-Trimethoprim   . Biaxin [Clarithromycin] Rash  . Septra [Bactrim] Rash  . Sulfa Antibiotics Rash    No medications prior to admission.    Review of Systems  Constitutional: Positive for chills and fatigue. Negative for fever.  Gastrointestinal: Positive for abdominal pain, diarrhea and nausea. Negative for vomiting.  Genitourinary: Negative for vaginal bleeding.   Physical Exam   Blood pressure 116/63, pulse 86, temperature 98.5 F (36.9 C), temperature source Oral, resp. rate 18, height 5\' 3"  (1.6 m), weight 71.7 kg, last menstrual period 12/09/2019, SpO2 99 %.  Physical Exam Vitals and nursing note reviewed. Exam conducted with a chaperone present.  Constitutional:      General: She is not in acute distress.  Appearance: Normal appearance.  HENT:     Head: Normocephalic and atraumatic.  Cardiovascular:     Pulses: Normal pulses.  Pulmonary:     Effort: Pulmonary effort is normal. No respiratory distress.  Abdominal:     General: There is no distension.     Palpations: Abdomen is soft. There is no mass.     Tenderness: There is no abdominal tenderness. There is no guarding or rebound.  Musculoskeletal:        General: Normal range of motion.     Cervical back: Normal range of motion.  Skin:    General: Skin is warm and dry.  Neurological:     General: No focal deficit present.     Mental Status: She is alert and oriented to person, place, and time.  Psychiatric:        Mood and Affect: Mood normal.        Behavior: Behavior  normal.    Results for orders placed or performed during the hospital encounter of 07/13/20 (from the past 24 hour(s))  Urinalysis, Routine w reflex microscopic     Status: Abnormal   Collection Time: 07/13/20  8:47 AM  Result Value Ref Range   Color, Urine AMBER (A) YELLOW   APPearance HAZY (A) CLEAR   Specific Gravity, Urine 1.027 1.005 - 1.030   pH 6.0 5.0 - 8.0   Glucose, UA NEGATIVE NEGATIVE mg/dL   Hgb urine dipstick SMALL (A) NEGATIVE   Bilirubin Urine NEGATIVE NEGATIVE   Ketones, ur NEGATIVE NEGATIVE mg/dL   Protein, ur 30 (A) NEGATIVE mg/dL   Nitrite NEGATIVE NEGATIVE   Leukocytes,Ua TRACE (A) NEGATIVE   RBC / HPF 11-20 0 - 5 RBC/hpf   WBC, UA 6-10 0 - 5 WBC/hpf   Bacteria, UA NONE SEEN NONE SEEN   Squamous Epithelial / LPF 0-5 0 - 5   Mucus PRESENT    Hyaline Casts, UA PRESENT   Pregnancy, urine POC     Status: Abnormal   Collection Time: 07/13/20  8:50 AM  Result Value Ref Range   Preg Test, Ur POSITIVE (A) NEGATIVE  CBC     Status: None   Collection Time: 07/13/20  9:33 AM  Result Value Ref Range   WBC 7.0 4.0 - 10.5 K/uL   RBC 4.17 3.87 - 5.11 MIL/uL   Hemoglobin 12.9 12.0 - 15.0 g/dL   HCT 71.0 62.6 - 94.8 %   MCV 86.8 80.0 - 100.0 fL   MCH 30.9 26.0 - 34.0 pg   MCHC 35.6 30.0 - 36.0 g/dL   RDW 54.6 27.0 - 35.0 %   Platelets 224 150 - 400 K/uL   nRBC 0.0 0.0 - 0.2 %  Comprehensive metabolic panel     Status: Abnormal   Collection Time: 07/13/20  9:33 AM  Result Value Ref Range   Sodium 135 135 - 145 mmol/L   Potassium 2.9 (L) 3.5 - 5.1 mmol/L   Chloride 103 98 - 111 mmol/L   CO2 22 22 - 32 mmol/L   Glucose, Bld 89 70 - 99 mg/dL   BUN 8 6 - 20 mg/dL   Creatinine, Ser 0.93 0.44 - 1.00 mg/dL   Calcium 8.7 (L) 8.9 - 10.3 mg/dL   Total Protein 6.6 6.5 - 8.1 g/dL   Albumin 3.7 3.5 - 5.0 g/dL   AST 81 (H) 15 - 41 U/L   ALT 91 (H) 0 - 44 U/L   Alkaline Phosphatase 56 38 - 126 U/L  Total Bilirubin 0.8 0.3 - 1.2 mg/dL   GFR, Estimated >52 >84 mL/min    Anion gap 10 5 - 15  Lipase, blood     Status: None   Collection Time: 07/13/20  9:33 AM  Result Value Ref Range   Lipase 23 11 - 51 U/L  Resp Panel by RT-PCR (Flu A&B, Covid) Nasopharyngeal Swab     Status: None   Collection Time: 07/13/20  1:04 PM   Specimen: Nasopharyngeal Swab; Nasopharyngeal(NP) swabs in vial transport medium  Result Value Ref Range   SARS Coronavirus 2 by RT PCR NEGATIVE NEGATIVE   Influenza A by PCR NEGATIVE NEGATIVE   Influenza B by PCR NEGATIVE NEGATIVE  Hepatitis panel, acute     Status: None   Collection Time: 07/13/20  1:55 PM  Result Value Ref Range   Hepatitis B Surface Ag NON REACTIVE NON REACTIVE   HCV Ab NON REACTIVE NON REACTIVE   Hep A IgM NON REACTIVE NON REACTIVE   Hep B C IgM NON REACTIVE NON REACTIVE  Ferritin     Status: None   Collection Time: 07/13/20  1:55 PM  Result Value Ref Range   Ferritin 77 11 - 307 ng/mL   US ABDOMEN LIMITED RUQ (LIVER/GB)  Result Date: 07/13/2020 CLINICAL DATA:  Abdominal pain affecting pregnancy. Additional history provided by scanning technologist: Patient reports pain (upper abdominal cramping), prior cholecystectomy, patient 5 weeks and 6 days pregnant. EXAM: ULTRASOUND ABDOMEN LIMITED RIGHT UPPER QUADRANT COMPARISON:  CT abdomen/pelvis 02/10/2020. FINDINGS: Gallbladder: Prior cholecystectomy. No pain is elicited while scanning over the gallbladder fossa. Common bile duct: Diameter: 6 mm, within normal limits. Liver: No focal lesion identified. Within normal limits in parenchymal echogenicity. Portal vein is patent on color Doppler imaging with normal direction of blood flow towards the liver. IMPRESSION: Prior cholecystectomy. Otherwise unremarkable right upper quadrant ultrasound, as described. Electronically Signed   By: Jackey Loge DO   On: 07/13/2020 12:17   MAU Course  Procedures LR Phenergan K Chloride 10 meq x2  MDM Labs ordered and reviewed. Hypokalemic and elevated LFTs, will add Lipase, hepatitis  panel, RUQ [place], and K runs. Pt denies herbal or etoh use. She has taken 500 mg of Tylenol twice in the last few weeks. She is taking Prozac and Zyrtec only. No signs of fatty liver disease, Hep panel neg. Pt feeling better after meds and IVFs, nausea improved, no emesis. No further diarrhea. Plan for f/u in office next week with repeat LFTs. Stable for discharge home.   Assessment and Plan   1. [redacted] weeks gestation of pregnancy   2. Abdominal pain affecting pregnancy   3. Gastroenteritis   4. Elevated liver enzymes   5. Hypokalemia    Discharge home Follow up at [facility] next week Rx Phenergan prn Rx Kdur x5d Imodium OTC prn Maintain hydration Return precautions  Allergies as of 07/13/2020      Reactions   Sulfamethoxazole-trimethoprim    Biaxin [clarithromycin] Rash   Septra [bactrim] Rash   Sulfa Antibiotics Rash      Medication List    STOP taking these medications   ALPRAZolam 0.5 MG tablet Commonly known as: XANAX   amphetamine-dextroamphetamine 20 MG 24 hr capsule Commonly known as: Adderall XR     TAKE these medications   cetirizine 10 MG tablet Commonly known as: ZYRTEC Take 10 mg by mouth daily.   FLUoxetine 40 MG capsule Commonly known as: PROZAC Take 40 mg by mouth daily.   potassium chloride  SA 20 MEQ tablet Commonly known as: KLOR-CON Take 1 tablet (20 mEq total) by mouth 2 (two) times daily.   promethazine 12.5 MG tablet Commonly known as: PHENERGAN Take 1-2 tablets (12.5-25 mg total) by mouth every 6 (six) hours as needed for nausea or vomiting.      Donette Larry, CNM 07/13/2020, 5:46 PM

## 2020-07-13 NOTE — MAU Note (Signed)
.   Jocelyn Waters is a 39 y.o. here in MAU reporting: diarrhea that started Saturday night. She states that she is also nauseous and is struggling to eat and drink. She denies vomiting. She reports feeling weak. Reports upper abdominal cramping. No VB Goes to [facility], has had an early [place] in the office.     Pain score: 2 Vitals:   07/13/20 0840  BP: 113/84  Pulse: 92  Resp: 16  Temp: 98.3 F (36.8 C)  SpO2: 100%      Lab orders placed from triage: UA

## 2020-07-13 NOTE — Discharge Instructions (Signed)
Hypokalemia Hypokalemia means that the amount of potassium in the blood is lower than normal. Potassium is a chemical (electrolyte) that helps regulate the amount of fluid in the body. It also stimulates muscle tightening (contraction) and helps nerves work properly. Normally, most of the body's potassium is inside cells, and only a very small amount is in the blood. Because the amount in the blood is so small, minor changes to potassium levels in the blood can be life-threatening. What are the causes? This condition may be caused by:  Antibiotic medicine.  Diarrhea or vomiting. Taking too much of a medicine that helps you have a bowel movement (laxative) can cause diarrhea and lead to hypokalemia.  Chronic kidney disease (CKD).  Medicines that help the body get rid of excess fluid (diuretics).  Eating disorders, such as bulimia.  Low magnesium levels in the body.  Sweating a lot. What are the signs or symptoms? Symptoms of this condition include:  Weakness.  Constipation.  Fatigue.  Muscle cramps.  Mental confusion.  Skipped heartbeats or irregular heartbeat (palpitations).  Tingling or numbness. How is this diagnosed? This condition is diagnosed with a blood test. How is this treated? This condition may be treated by:  Taking potassium supplements by mouth.  Adjusting the medicines that you take.  Eating more foods that contain a lot of potassium. If your potassium level is very low, you may need to get potassium through an IV and be monitored in the hospital. Follow these instructions at home:   Take over-the-counter and prescription medicines only as told by your health care provider. This includes vitamins and supplements.  Eat a healthy diet. A healthy diet includes fresh fruits and vegetables, whole grains, healthy fats, and lean proteins.  If instructed, eat more foods that contain a lot of potassium. This includes: ? Nuts, such as peanuts and  pistachios. ? Seeds, such as sunflower seeds and pumpkin seeds. ? Peas, lentils, and lima beans. ? Whole grain and bran cereals and breads. ? Fresh fruits and vegetables, such as apricots, avocado, bananas, cantaloupe, kiwi, oranges, tomatoes, asparagus, and potatoes. ? Orange juice. ? Tomato juice. ? Red meats. ? Yogurt.  Keep all follow-up visits as told by your health care provider. This is important. Contact a health care provider if you:  Have weakness that gets worse.  Feel your heart pounding or racing.  Vomit.  Have diarrhea.  Have diabetes (diabetes mellitus) and you have trouble keeping your blood sugar (glucose) in your target range. Get help right away if you:  Have chest pain.  Have shortness of breath.  Have vomiting or diarrhea that lasts for more than 2 days.  Faint. Summary  Hypokalemia means that the amount of potassium in the blood is lower than normal.  This condition is diagnosed with a blood test.  Hypokalemia may be treated by taking potassium supplements, adjusting the medicines that you take, or eating more foods that are high in potassium.  If your potassium level is very low, you may need to get potassium through an IV and be monitored in the hospital. This information is not intended to replace advice given to you by your health care provider. Make sure you discuss any questions you have with your health care provider. Document Revised: 03/04/2018 Document Reviewed: 03/04/2018 Elsevier Patient Education  2020 Elsevier Inc.   Diarrhea, Adult Diarrhea is frequent loose and watery bowel movements. Diarrhea can make you feel weak and cause you to become dehydrated. Dehydration can make you  tired and thirsty, cause you to have a dry mouth, and decrease how often you urinate. Diarrhea typically lasts 2-3 days. However, it can last longer if it is a sign of something more serious. It is important to treat your diarrhea as told by your health care  provider. Follow these instructions at home: Eating and drinking     Follow these recommendations as told by your health care provider:  Take an oral rehydration solution (ORS). This is an over-the-counter medicine that helps return your body to its normal balance of nutrients and water. It is found at pharmacies and retail stores.  Drink plenty of fluids, such as water, ice chips, diluted fruit juice, and low-calorie sports drinks. You can drink milk also, if desired.  Avoid drinking fluids that contain a lot of sugar or caffeine, such as energy drinks, sports drinks, and soda.  Eat bland, easy-to-digest foods in small amounts as you are able. These foods include bananas, applesauce, rice, lean meats, toast, and crackers.  Avoid alcohol.  Avoid spicy or fatty foods.  Medicines  Take over-the-counter and prescription medicines only as told by your health care provider.  If you were prescribed an antibiotic medicine, take it as told by your health care provider. Do not stop using the antibiotic even if you start to feel better. General instructions   Wash your hands often using soap and water. If soap and water are not available, use a hand sanitizer. Others in the household should wash their hands as well. Hands should be washed: ? After using the toilet or changing a diaper. ? Before preparing, cooking, or serving food. ? While caring for a sick person or while visiting someone in a hospital.  Drink enough fluid to keep your urine pale yellow.  Rest at home while you recover.  Watch your condition for any changes.  Take a warm bath to relieve any burning or pain from frequent diarrhea episodes.  Keep all follow-up visits as told by your health care provider. This is important. Contact a health care provider if:  You have a fever.  Your diarrhea gets worse.  You have new symptoms.  You cannot keep fluids down.  You feel light-headed or dizzy.  You have a  headache.  You have muscle cramps. Get help right away if:  You have chest pain.  You feel extremely weak or you faint.  You have bloody or black stools or stools that look like tar.  You have severe pain, cramping, or bloating in your abdomen.  You have trouble breathing or you are breathing very quickly.  Your heart is beating very quickly.  Your skin feels cold and clammy.  You feel confused.  You have signs of dehydration, such as: ? Dark urine, very little urine, or no urine. ? Cracked lips. ? Dry mouth. ? Sunken eyes. ? Sleepiness. ? Weakness. Summary  Diarrhea is frequent loose and watery bowel movements. Diarrhea can make you feel weak and cause you to become dehydrated.  Drink enough fluids to keep your urine pale yellow.  Make sure that you wash your hands after using the toilet. If soap and water are not available, use hand sanitizer.  Contact a health care provider if your diarrhea gets worse or you have new symptoms.  Get help right away if you have signs of dehydration. This information is not intended to replace advice given to you by your health care provider. Make sure you discuss any questions you have with your  health care provider. Document Revised: 12/08/2018 Document Reviewed: 12/26/2017 Elsevier Patient Education  2020 [employer].

## 2020-07-18 LAB — OVA + PARASITE EXAM

## 2020-07-18 LAB — O&P RESULT

## 2020-08-05 NOTE — L&D Delivery Note (Signed)
Delivery Note At 1:22 AM a viable and healthy female was delivered via  SVD (Presentation: Left Occiput Anterior).   APGAR: 8, 9;  weight 7 lb 4.4 oz (3300 g).   Placenta status: Manual placenta removal Intact.  To pathology Cord: 3 vessels with the following complications: Knot.   Nuchal x 1 easily reduced. True knot present  Anesthesia: Epidural Episiotomy: None Lacerations: None Suture Repair:  na Est. Blood Loss (mL): 200  Mom with persistent vomiting. IV phenergan now x 1, Stadol x 1 now for pain  Mom to postpartum.  Baby to Couplet care / Skin to Skin.  Victorino Dike B Ramiya Delahunty 02/18/2021, 1:52 AM

## 2020-08-15 LAB — OB RESULTS CONSOLE HIV ANTIBODY (ROUTINE TESTING): HIV: NONREACTIVE

## 2020-08-15 LAB — OB RESULTS CONSOLE GC/CHLAMYDIA
Chlamydia: NEGATIVE
Gonorrhea: NEGATIVE

## 2020-08-15 LAB — OB RESULTS CONSOLE RUBELLA ANTIBODY, IGM: Rubella: IMMUNE

## 2020-11-16 ENCOUNTER — Encounter (HOSPITAL_COMMUNITY): Payer: Self-pay | Admitting: Obstetrics & Gynecology

## 2020-11-16 ENCOUNTER — Inpatient Hospital Stay (HOSPITAL_COMMUNITY)
Admission: AD | Admit: 2020-11-16 | Discharge: 2020-11-16 | Disposition: A | Discharge status: Home / Self Care | Payer: 59 | Attending: Obstetrics & Gynecology | Admitting: Obstetrics & Gynecology

## 2020-11-16 ENCOUNTER — Other Ambulatory Visit: Payer: Self-pay

## 2020-11-16 DIAGNOSIS — O212 Late vomiting of pregnancy: Secondary | ICD-10-CM | POA: Insufficient documentation

## 2020-11-16 DIAGNOSIS — Z87891 Personal history of nicotine dependence: Secondary | ICD-10-CM | POA: Insufficient documentation

## 2020-11-16 DIAGNOSIS — O99612 Diseases of the digestive system complicating pregnancy, second trimester: Secondary | ICD-10-CM | POA: Diagnosis not present

## 2020-11-16 DIAGNOSIS — O99282 Endocrine, nutritional and metabolic diseases complicating pregnancy, second trimester: Secondary | ICD-10-CM | POA: Diagnosis not present

## 2020-11-16 DIAGNOSIS — Z3A23 23 weeks gestation of pregnancy: Secondary | ICD-10-CM | POA: Diagnosis not present

## 2020-11-16 DIAGNOSIS — J309 Allergic rhinitis, unspecified: Secondary | ICD-10-CM | POA: Insufficient documentation

## 2020-11-16 DIAGNOSIS — K529 Noninfective gastroenteritis and colitis, unspecified: Secondary | ICD-10-CM | POA: Diagnosis not present

## 2020-11-16 DIAGNOSIS — E876 Hypokalemia: Secondary | ICD-10-CM | POA: Diagnosis not present

## 2020-11-16 DIAGNOSIS — F324 Major depressive disorder, single episode, in partial remission: Secondary | ICD-10-CM | POA: Insufficient documentation

## 2020-11-16 LAB — URINALYSIS, ROUTINE W REFLEX MICROSCOPIC
Bacteria, UA: NONE SEEN
Bilirubin Urine: NEGATIVE
Glucose, UA: NEGATIVE mg/dL
Ketones, ur: 5 mg/dL — AB
Leukocytes,Ua: NEGATIVE
Nitrite: NEGATIVE
Protein, ur: 30 mg/dL — AB
Specific Gravity, Urine: 1.025 (ref 1.005–1.030)
pH: 6 (ref 5.0–8.0)

## 2020-11-16 LAB — BASIC METABOLIC PANEL
Anion gap: 9 (ref 5–15)
BUN: 9 mg/dL (ref 6–20)
CO2: 21 mmol/L — ABNORMAL LOW (ref 22–32)
Calcium: 8 mg/dL — ABNORMAL LOW (ref 8.9–10.3)
Chloride: 105 mmol/L (ref 98–111)
Creatinine, Ser: 0.58 mg/dL (ref 0.44–1.00)
GFR, Estimated: >60 mL/min (ref >60)
Glucose, Bld: 99 mg/dL (ref 70–99)
Potassium: 3.2 mmol/L — ABNORMAL LOW (ref 3.5–5.1)
Sodium: 135 mmol/L (ref 135–145)

## 2020-11-16 MED ORDER — SODIUM CHLORIDE 0.9 % IV SOLN
8.0000 mg | Freq: Once | INTRAVENOUS | Status: AC
  Administered 2020-11-16: 8 mg via INTRAVENOUS
  Filled 2020-11-16: qty 4

## 2020-11-16 MED ORDER — LACTATED RINGERS IV BOLUS
1000.0000 mL | Freq: Once | INTRAVENOUS | Status: AC
  Administered 2020-11-16: 1000 mL via INTRAVENOUS

## 2020-11-16 MED ORDER — PROMETHAZINE HCL 25 MG PO TABS: 25.0000 mg | ORAL_TABLET | Freq: Four times a day (QID) | ORAL | 0 refills | Status: DC | PRN

## 2020-11-16 MED ORDER — POTASSIUM CHLORIDE ER 10 MEQ PO CPCR: 10.0000 meq | ORAL_CAPSULE | Freq: Two times a day (BID) | ORAL | 0 refills | Status: DC

## 2020-11-16 NOTE — Discharge Instructions (Signed)
Bland Diet A bland diet consists of foods that are often soft and do not have a lot of fat, fiber, or extra seasonings. Foods without fat, fiber, or seasoning are easier for the body to digest. They are also less likely to irritate your mouth, throat, stomach, and other parts of your digestive system. A bland diet is sometimes called a BRAT diet. What is my plan? Your health care provider or food and nutrition specialist (dietitian) may recommend specific changes to your diet to prevent symptoms or to treat your symptoms. These changes may include:  Eating small meals often.  Cooking food until it is soft enough to chew easily.  Chewing your food well.  Drinking fluids slowly.  Not eating foods that are very spicy, sour, or fatty.  Not eating citrus fruits, such as oranges and grapefruit. What do I need to know about this diet?  Eat a variety of foods from the bland diet food list.  Do not follow a bland diet longer than needed.  Ask your health care provider whether you should take vitamins or supplements. What foods can I eat? Grains Hot cereals, such as cream of wheat. Rice. Bread, crackers, or tortillas made from refined white flour.   Vegetables Canned or cooked vegetables. Mashed or boiled potatoes. Fruits Bananas. Applesauce. Other types of cooked or canned fruit with the skin and seeds removed, such as canned peaches or pears.   Meats and other proteins Scrambled eggs. Creamy peanut butter or other nut butters. Lean, well-cooked meats, such as chicken or fish. Tofu. Soups or broths.   Dairy Low-fat dairy products, such as milk, cottage cheese, or yogurt. Beverages Water. Herbal tea. Apple juice.   Fats and oils Mild salad dressings. Canola or olive oil. Sweets and desserts Pudding. Custard. Fruit gelatin. Ice cream. The items listed above may not be a complete list of recommended foods and beverages. Contact a dietitian for more options. What foods are not  recommended? Grains Whole grain breads and cereals. Vegetables Raw vegetables. Fruits Raw fruits, especially citrus, berries, or dried fruits. Dairy Whole fat dairy foods. Beverages Caffeinated drinks. Alcohol. Seasonings and condiments Strongly flavored seasonings or condiments. Hot sauce. Salsa. Other foods Spicy foods. Fried foods. Sour foods, such as pickled or fermented foods. Foods with high sugar content. Foods high in fiber. The items listed above may not be a complete list of foods and beverages to avoid. Contact a dietitian for more information. Summary  A bland diet consists of foods that are often soft and do not have a lot of fat, fiber, or extra seasonings.  Foods without fat, fiber, or seasoning are easier for the body to digest.  Check with your health care provider to see how long you should follow this diet plan. It is not meant to be followed for long periods. This information is not intended to replace advice given to you by your health care provider. Make sure you discuss any questions you have with your health care provider. Document Revised: 08/20/2017 Document Reviewed: 08/20/2017 Elsevier Patient Education  2021 Elsevier Inc.  

## 2020-11-16 NOTE — MAU Note (Signed)
Pt reports that she thinks she has the stomach bug. Pt reports that both of her children just had the stomach bug.   Pt reports vomiting and diarrhea for 3 days.  Reports +FM   Denies vaginal bleeding or LOF.

## 2020-11-16 NOTE — MAU Provider Note (Addendum)
History     [id number]  Arrival date and time: 11/16/20 1306    Chief Complaint  Patient presents with  . Dehydration     HPI Jocelyn Waters is a 40 y.o. at [redacted]w[redacted]d who presents for vomiting & diarrhea. Symptoms started 3 days ago. Both of her children were sick with same symptoms when she became ill. Reports vomiting & diarrhea. Has vomited once this morning & had 10 episodes of loose stools. Took zofran yesterday without relief. Has been drinking gatorade at home but reports she's unable to keep anything in. Denies abdominal pain, fever, vaginal bleeding, or LOF. Reports good fetal movement.  Goes to [facility] for prenatal care.        OB History    Gravida  6   Para  2   Term  2   Preterm  0   AB  3   Living  2     SAB  2   IAB  1   Ectopic  0   Multiple  0   Live Births  2           Past Medical History:  Diagnosis Date  . ADD (attention deficit disorder)   . Anxiety   . Depression   . History of abnormal cervical Pap smear 2007  . History of kidney stones 2016  . History of pregnancy induced hypertension   . Hx of varicella   . PONV (postoperative nausea and vomiting)     Past Surgical History:  Procedure Laterality Date  . CHOLECYSTECTOMY N/A 12/16/2019   Procedure: LAPAROSCOPIC CHOLECYSTECTOMY;  Surgeon: [name], MD;  Location: [facility];  Service: General;  Laterality: N/A;  GENERAL WITH BILATERAL TAP BLOCK  . DILATION AND EVACUATION N/A 10/13/2019   Procedure: SUCTION  DILATATION AND EVACUATION WITH CHROMOSOME STUDIES;  Surgeon: Essie Hart, MD;  Location: [facility] [place];  Service: Gynecology;  Laterality: N/A;  . KNEE ARTHROSCOPY Right 2000  . LAPAROSCOPIC APPENDECTOMY N/A 02/10/2020   Procedure: APPENDECTOMY LAPAROSCOPIC;  Surgeon: Darnell Level, MD;  Location: [facility];  Service: General;  Laterality: N/A;  . THERAPEUTIC ABORTION  05/2006  . WISDOM TOOTH EXTRACTION  teen    Family History  Problem Relation  Age of Onset  . Diabetes Maternal Aunt   . Hypertension Maternal Grandmother   . Hypertension Paternal Grandmother   . Diabetes Paternal Grandfather     Social History   Socioeconomic History  . Marital status: Married    Spouse name: Not on file  . Number of children: Not on file  . Years of education: Not on file  . Highest education level: Not on file  Occupational History  . Not on file  Tobacco Use  . Smoking status: Former Smoker    Years: 15.00    Quit date: 08/31/2012    Years since quitting: 8.2  . Smokeless tobacco: Never Used  Vaping Use  . Vaping Use: Former  . Quit date: 10/11/2012  Substance and Sexual Activity  . Alcohol use: Not Currently    Comment: SOCIALY  . Drug use: No  . Sexual activity: Yes    Partners: Male    Birth control/protection: None  Other Topics Concern  . Not on file  Social History Narrative  . Not on file   Social Determinants of Health   Financial Resource Strain: Not on file  Food Insecurity: Not on file  Transportation Needs: Not on file  Physical Activity: Not on file  Stress: Not on file  Social Connections: Not on file  Intimate Partner Violence: Not on file    Allergies  Allergen Reactions  . Sulfamethoxazole-Trimethoprim   . Biaxin [Clarithromycin] Rash  . Septra [Bactrim] Rash  . Sulfa Antibiotics Rash    No current facility-administered medications on file prior to encounter.   Current Outpatient Medications on File Prior to Encounter  Medication Sig Dispense Refill  . cetirizine (ZYRTEC) 10 MG tablet Take 10 mg by mouth daily.    Marland Kitchen FLUoxetine (PROZAC) 40 MG capsule Take 40 mg by mouth daily.    . ondansetron (ZOFRAN) 8 MG tablet Take by mouth every 8 (eight) hours as needed for nausea or vomiting.    . pantoprazole (PROTONIX) 40 MG tablet Take 1 tablet by mouth daily.       ROS Pertinent positives and negative per HPI, all others reviewed and negative  Physical Exam   BP 114/69   Pulse 85   Temp  98.4 F (36.9 C)   Resp 12   LMP 12/09/2019 (Exact Date)   SpO2 95%   Physical Exam Vitals and nursing note reviewed.  Constitutional:      General: She is not in acute distress.    Appearance: Normal appearance.  HENT:     Head: Normocephalic and atraumatic.  Pulmonary:     Effort: Pulmonary effort is normal. No respiratory distress.  Neurological:     Mental Status: She is alert.  Psychiatric:        Mood and Affect: Mood normal.        Behavior: Behavior normal.     FHT Baseline 145, moderate variability, no accels, no decels Toco: none Cat: 1  Labs Results for orders placed or performed during the hospital encounter of 11/16/20 (from the past 24 hour(s))  Urinalysis, Routine w reflex microscopic Urine, Clean Catch     Status: Abnormal   Collection Time: 11/16/20  1:20 PM  Result Value Ref Range   Color, Urine AMBER (A) YELLOW   APPearance TURBID (A) CLEAR   Specific Gravity, Urine 1.025 1.005 - 1.030   pH 6.0 5.0 - 8.0   Glucose, UA NEGATIVE NEGATIVE mg/dL   Hgb urine dipstick SMALL (A) NEGATIVE   Bilirubin Urine NEGATIVE NEGATIVE   Ketones, ur 5 (A) NEGATIVE mg/dL   Protein, ur 30 (A) NEGATIVE mg/dL   Nitrite NEGATIVE NEGATIVE   Leukocytes,Ua NEGATIVE NEGATIVE   RBC / HPF 6-10 0 - 5 RBC/hpf   WBC, UA 11-20 0 - 5 WBC/hpf   Bacteria, UA NONE SEEN NONE SEEN   Squamous Epithelial / LPF 11-20 0 - 5   Mucus PRESENT   Basic metabolic panel     Status: Abnormal   Collection Time: 11/16/20  2:14 PM  Result Value Ref Range   Sodium 135 135 - 145 mmol/L   Potassium 3.2 (L) 3.5 - 5.1 mmol/L   Chloride 105 98 - 111 mmol/L   CO2 21 (L) 22 - 32 mmol/L   Glucose, Bld 99 70 - 99 mg/dL   BUN 9 6 - 20 mg/dL   Creatinine, Ser 0.73 0.44 - 1.00 mg/dL   Calcium 8.0 (L) 8.9 - 10.3 mg/dL   GFR, Estimated >71 >06 mL/min   Anion gap 9 5 - 15    Imaging No results found.  MAU Course  Procedures  Lab Orders     Urinalysis, Routine w reflex microscopic Urine, Clean  Catch     Basic metabolic panel Meds ordered  this encounter  Medications  . lactated ringers bolus 1,000 mL  . ondansetron (ZOFRAN) 8 mg in sodium chloride 0.9 % 50 mL IVPB  . lactated ringers bolus 1,000 mL  . potassium chloride (MICRO-K) 10 MEQ CR capsule    Sig: Take 1 capsule (10 mEq total) by mouth 2 (two) times daily for 2 days.    Dispense:  4 capsule    Refill:  0    Order Specific Question:   Supervising Provider    Answer:   Venora Maples [[id number]]  . promethazine (PHENERGAN) 25 MG tablet    Sig: Take 1 tablet (25 mg total) by mouth every 6 (six) hours as needed for nausea or vomiting.    Dispense:  30 tablet    Refill:  0    Order Specific Question:   Supervising Provider    Answer:   Venora Maples [[id number]]   Imaging Orders  No imaging studies ordered today    MDM Patient presents with complaint of n/v/d that started after her children were sick with same illness. Given IV fluids & zofran in MAU. Reports improvement in symptoms. No vomiting or diarrhea episodes in MAU. BMP ordered due to patient's concern for hypokalemia with last GI illness. Potassium 3.2 - will send home with rx micro k x 2 days - pt agreeable with this plan.   Assessment and Plan   1. Gastroenteritis, acute  -bland diet, advance as tolerated -Has zofran at home, requesting refill for phenergan - prescribed -can take immodium or pepto bismol as needed -reviewed reasons to return to MAU  2. Hypokalemia  -Rx micro k 10 meq BID x 2 days  3. [redacted] weeks gestation of pregnancy     Judeth Horn, NP

## 2021-01-17 ENCOUNTER — Encounter (HOSPITAL_COMMUNITY): Payer: Self-pay | Admitting: Obstetrics & Gynecology

## 2021-01-17 ENCOUNTER — Inpatient Hospital Stay (HOSPITAL_COMMUNITY)
Admission: AD | Admit: 2021-01-17 | Discharge: 2021-01-17 | Disposition: A | Discharge status: Home / Self Care | Payer: 59 | Attending: Obstetrics & Gynecology | Admitting: Obstetrics & Gynecology

## 2021-01-17 DIAGNOSIS — R030 Elevated blood-pressure reading, without diagnosis of hypertension: Secondary | ICD-10-CM | POA: Insufficient documentation

## 2021-01-17 DIAGNOSIS — O368130 Decreased fetal movements, third trimester, not applicable or unspecified: Secondary | ICD-10-CM | POA: Diagnosis not present

## 2021-01-17 DIAGNOSIS — O4703 False labor before 37 completed weeks of gestation, third trimester: Secondary | ICD-10-CM | POA: Diagnosis not present

## 2021-01-17 DIAGNOSIS — O26893 Other specified pregnancy related conditions, third trimester: Secondary | ICD-10-CM | POA: Diagnosis not present

## 2021-01-17 DIAGNOSIS — Z3A32 32 weeks gestation of pregnancy: Secondary | ICD-10-CM | POA: Insufficient documentation

## 2021-01-17 DIAGNOSIS — Z87891 Personal history of nicotine dependence: Secondary | ICD-10-CM | POA: Diagnosis not present

## 2021-01-17 DIAGNOSIS — Z3689 Encounter for other specified antenatal screening: Secondary | ICD-10-CM

## 2021-01-17 LAB — COMPREHENSIVE METABOLIC PANEL
ALT: 13 U/L (ref 0–44)
AST: 24 U/L (ref 15–41)
Albumin: 2.6 g/dL — ABNORMAL LOW (ref 3.5–5.0)
Alkaline Phosphatase: 59 U/L (ref 38–126)
Anion gap: 10 (ref 5–15)
BUN: 6 mg/dL (ref 6–20)
CO2: 20 mmol/L — ABNORMAL LOW (ref 22–32)
Calcium: 8.8 mg/dL — ABNORMAL LOW (ref 8.9–10.3)
Chloride: 106 mmol/L (ref 98–111)
Creatinine, Ser: 0.64 mg/dL (ref 0.44–1.00)
GFR, Estimated: >60 mL/min (ref >60)
Glucose, Bld: 97 mg/dL (ref 70–99)
Potassium: 3.4 mmol/L — ABNORMAL LOW (ref 3.5–5.1)
Sodium: 136 mmol/L (ref 135–145)
Total Bilirubin: 0.2 mg/dL — ABNORMAL LOW (ref 0.3–1.2)
Total Protein: 5.6 g/dL — ABNORMAL LOW (ref 6.5–8.1)

## 2021-01-17 LAB — URINALYSIS, ROUTINE W REFLEX MICROSCOPIC
Bilirubin Urine: NEGATIVE
Glucose, UA: NEGATIVE mg/dL
Ketones, ur: 80 mg/dL — AB
Nitrite: NEGATIVE
Protein, ur: 30 mg/dL — AB
RBC / HPF: >50 RBC/hpf — ABNORMAL HIGH (ref 0–5)
Specific Gravity, Urine: 1.024 (ref 1.005–1.030)
pH: 6 (ref 5.0–8.0)

## 2021-01-17 LAB — CBC
HCT: 31.4 % — ABNORMAL LOW (ref 36.0–46.0)
Hemoglobin: 10.1 g/dL — ABNORMAL LOW (ref 12.0–15.0)
MCH: 26.7 pg (ref 26.0–34.0)
MCHC: 32.2 g/dL (ref 30.0–36.0)
MCV: 83.1 fL (ref 80.0–100.0)
Platelets: 192 10*3/uL (ref 150–400)
RBC: 3.78 MIL/uL — ABNORMAL LOW (ref 3.87–5.11)
RDW: 16.8 % — ABNORMAL HIGH (ref 11.5–15.5)
WBC: 11 10*3/uL — ABNORMAL HIGH (ref 4.0–10.5)
nRBC: 0 % (ref 0.0–0.2)

## 2021-01-17 LAB — PROTEIN / CREATININE RATIO, URINE
Creatinine, Urine: 185.4 mg/dL
Protein Creatinine Ratio: 0.22 mg/mg{Cre} — ABNORMAL HIGH (ref 0.00–0.15)
Total Protein, Urine: 40 mg/dL

## 2021-01-17 MED ORDER — NIFEDIPINE 10 MG PO CAPS
10.0000 mg | ORAL_CAPSULE | ORAL | Status: DC | PRN
  Administered 2021-01-17 (×2): 10 mg via ORAL
  Filled 2021-01-17 (×2): qty 1

## 2021-01-17 MED ORDER — ONDANSETRON 4 MG PO TBDP
8.0000 mg | ORAL_TABLET | Freq: Once | ORAL | Status: AC
  Administered 2021-01-17: 16:00:00 8 mg via ORAL
  Filled 2021-01-17: qty 2

## 2021-01-17 MED ORDER — LACTATED RINGERS IV BOLUS
1000.0000 mL | Freq: Once | INTRAVENOUS | Status: AC
  Administered 2021-01-17: 16:00:00 1000 mL via INTRAVENOUS

## 2021-01-17 MED ORDER — ACETAMINOPHEN 325 MG PO TABS
650.0000 mg | ORAL_TABLET | Freq: Once | ORAL | Status: AC
  Administered 2021-01-17: 16:00:00 650 mg via ORAL
  Filled 2021-01-17: qty 2

## 2021-01-17 NOTE — MAU Note (Signed)
Been feeling bad since the weekend.  Started throwing up on Sun. Been really nauseous this morning.  Been having a lot of cramping, tightness in the top of her belly. Hasn't been feeling baby move as much today.

## 2021-01-17 NOTE — MAU Provider Note (Signed)
History     CSN: [id number]  Arrival date and time: 01/17/21 1433   Event Date/Time   First Provider Initiated Contact with Patient 01/17/21 1514      Chief Complaint  Patient presents with   Abdominal Pain   Nausea   Decreased Fetal Movement   HPI Jocelyn Waters is a 40 y.o. [id number] at [redacted]w[redacted]d who presents with contractions, nausea, decreased fetal movement, & headache.  Reports cramping & tightening for the last few days. Feels more likely pressure & tightening but at times takes her breat away. Denies vaginal bleeding or LOF.  Decreased fetal movement today. Can't give a number but says it's not as frequent as she normally does.  Denies hx of chronic hypertension but does have a hx of preeclampsia in her first pregnancy & gestational hypertension last pregnancy. Has had some elevated BPs at home during this pregnancy but normal in the office. Reports headache for the last 3 days. Rates pain 3/10. Hasn't treated pain. Denies visual disturbance or epigastric pain.   OB History     Gravida  6   Para  2   Term  2   Preterm  0   AB  3   Living  2      SAB  2   IAB  1   Ectopic  0   Multiple  0   Live Births  2           Past Medical History:  Diagnosis Date   ADD (attention deficit disorder)    Anxiety    Depression    History of abnormal cervical Pap smear 2007   History of kidney stones 2016   History of pregnancy induced hypertension    Hx of varicella    PONV (postoperative nausea and vomiting)     Past Surgical History:  Procedure Laterality Date   CHOLECYSTECTOMY N/A 12/16/2019   Procedure: LAPAROSCOPIC CHOLECYSTECTOMY;  Surgeon: [name], MD;  Location: [facility];  Service: General;  Laterality: N/A;  GENERAL WITH BILATERAL TAP BLOCK   DILATION AND EVACUATION N/A 10/13/2019   Procedure: SUCTION  DILATATION AND EVACUATION WITH CHROMOSOME STUDIES;  Surgeon: Essie Hart, MD;  Location: [facility];  Service:  Gynecology;  Laterality: N/A;   KNEE ARTHROSCOPY Right 2000   LAPAROSCOPIC APPENDECTOMY N/A 02/10/2020   Procedure: APPENDECTOMY LAPAROSCOPIC;  Surgeon: Darnell Level, MD;  Location: [facility];  Service: General;  Laterality: N/A;   THERAPEUTIC ABORTION  05/2006   WISDOM TOOTH EXTRACTION  teen    Family History  Problem Relation Age of Onset   Diabetes Maternal Aunt    Hypertension Maternal Grandmother    Hypertension Paternal Grandmother    Diabetes Paternal Grandfather     Social History   Tobacco Use   Smoking status: Former    Years: 15.00    Pack years: 0.00    Types: Cigarettes    Quit date: 08/31/2012    Years since quitting: 8.3   Smokeless tobacco: Never  Vaping Use   Vaping Use: Former   Quit date: 10/11/2012  Substance Use Topics   Alcohol use: Not Currently    Comment: SOCIALY   Drug use: No    Allergies:  Allergies  Allergen Reactions   Sulfamethoxazole-Trimethoprim    Biaxin [Clarithromycin] Rash   Septra [Bactrim] Rash   Sulfa Antibiotics Rash    Medications Prior to Admission  Medication Sig Dispense Refill Last Dose   cetirizine (ZYRTEC) 10 MG tablet  Take 10 mg by mouth daily.      FEROSUL 325 (65 Fe) MG tablet Take 325 mg by mouth 2 (two) times daily.      FLUoxetine (PROZAC) 40 MG capsule Take 40 mg by mouth daily.      ondansetron (ZOFRAN) 8 MG tablet Take by mouth every 8 (eight) hours as needed for nausea or vomiting.      ondansetron (ZOFRAN-ODT) 8 MG disintegrating tablet Take by mouth.      pantoprazole (PROTONIX) 40 MG tablet Take 1 tablet by mouth daily.      potassium chloride (MICRO-K) 10 MEQ CR capsule Take 1 capsule (10 mEq total) by mouth 2 (two) times daily for 2 days. 4 capsule 0    promethazine (PHENERGAN) 25 MG tablet Take 1 tablet (25 mg total) by mouth every 6 (six) hours as needed for nausea or vomiting. 30 tablet 0     Review of Systems  Constitutional: Negative.   Eyes: Negative.   Gastrointestinal:  Positive for abdominal  pain, nausea and vomiting. Negative for constipation and diarrhea.  Genitourinary: Negative.   Neurological:  Positive for headaches.  Physical Exam   Blood pressure 115/67, pulse 97, temperature 98.5 F (36.9 C), temperature source Oral, resp. rate 18, height 5\' 3"  (1.6 m), weight 102.6 kg, last menstrual period 12/09/2019, SpO2 98 %.  Physical Exam Vitals and nursing note reviewed. Exam conducted with a chaperone present.  Constitutional:      General: She is not in acute distress.    Appearance: She is well-developed.  HENT:     Head: Normocephalic and atraumatic.  Eyes:     General: No scleral icterus. Cardiovascular:     Rate and Rhythm: Normal rate and regular rhythm.  Pulmonary:     Effort: Pulmonary effort is normal. No respiratory distress.  Abdominal:     Tenderness: There is no abdominal tenderness.  Genitourinary:    Comments: Dilation: Closed (external os 1.5) Effacement (%): Thick Exam by:: Dnya Hickle np  Skin:    General: Skin is warm and dry.  Neurological:     Mental Status: She is alert.  Psychiatric:        Mood and Affect: Mood normal.        Behavior: Behavior normal.   NST:  Baseline: 130 bpm, Variability: Good {> 6 bpm), Accelerations: Reactive, and Decelerations: Absent' MAU Course  Procedures Results for orders placed or performed during the hospital encounter of 01/17/21 (from the past 24 hour(s))  Urinalysis, Routine w reflex microscopic Urine, Clean Catch     Status: Abnormal   Collection Time: 01/17/21  3:20 PM  Result Value Ref Range   Color, Urine YELLOW YELLOW   APPearance HAZY (A) CLEAR   Specific Gravity, Urine 1.024 1.005 - 1.030   pH 6.0 5.0 - 8.0   Glucose, UA NEGATIVE NEGATIVE mg/dL   Hgb urine dipstick MODERATE (A) NEGATIVE   Bilirubin Urine NEGATIVE NEGATIVE   Ketones, ur 80 (A) NEGATIVE mg/dL   Protein, ur 30 (A) NEGATIVE mg/dL   Nitrite NEGATIVE NEGATIVE   Leukocytes,Ua SMALL (A) NEGATIVE   RBC / HPF >50 (H) 0 - 5  RBC/hpf   WBC, UA 6-10 0 - 5 WBC/hpf   Bacteria, UA RARE (A) NONE SEEN   Squamous Epithelial / LPF 0-5 0 - 5   Mucus PRESENT    Ca Oxalate Crys, UA PRESENT   Protein / creatinine ratio, urine     Status: Abnormal   Collection Time:  01/17/21  3:24 PM  Result Value Ref Range   Creatinine, Urine 185.40 mg/dL   Total Protein, Urine 40 mg/dL   Protein Creatinine Ratio 0.22 (H) 0.00 - 0.15 mg/mg[Cre]  CBC     Status: Abnormal   Collection Time: 01/17/21  3:30 PM  Result Value Ref Range   WBC 11.0 (H) 4.0 - 10.5 K/uL   RBC 3.78 (L) 3.87 - 5.11 MIL/uL   Hemoglobin 10.1 (L) 12.0 - 15.0 g/dL   HCT 93.7 (L) 16.9 - 67.8 %   MCV 83.1 80.0 - 100.0 fL   MCH 26.7 26.0 - 34.0 pg   MCHC 32.2 30.0 - 36.0 g/dL   RDW 93.8 (H) 10.1 - 75.1 %   Platelets 192 150 - 400 K/uL   nRBC 0.0 0.0 - 0.2 %  Comprehensive metabolic panel     Status: Abnormal   Collection Time: 01/17/21  3:30 PM  Result Value Ref Range   Sodium 136 135 - 145 mmol/L   Potassium 3.4 (L) 3.5 - 5.1 mmol/L   Chloride 106 98 - 111 mmol/L   CO2 20 (L) 22 - 32 mmol/L   Glucose, Bld 97 70 - 99 mg/dL   BUN 6 6 - 20 mg/dL   Creatinine, Ser 0.25 0.44 - 1.00 mg/dL   Calcium 8.8 (L) 8.9 - 10.3 mg/dL   Total Protein 5.6 (L) 6.5 - 8.1 g/dL   Albumin 2.6 (L) 3.5 - 5.0 g/dL   AST 24 15 - 41 U/L   ALT 13 0 - 44 U/L   Alkaline Phosphatase 59 38 - 126 U/L   Total Bilirubin 0.2 (L) 0.3 - 1.2 mg/dL   GFR, Estimated >85 >27 mL/min   Anion gap 10 5 - 15    MDM Decreased fetal movement - reactive tracing while in MAU & patient reports return of normal movement  Hypertension - patient with new onset hypertension x 2 readings. No severe feature. Preeclampsia labs normal. Headache she rates 3/10, treated with tylenol.   Contractions - cervix closed. Given IV fluids & procardia. Reports improvement in symptoms & declines repeat cervical exam   Assessment and Plan   1. Elevated BP without diagnosis of hypertension  -does not yet meet criteria  for gestational hypertension -no signs of preeclampsia at this time, reviewed s/s of preE & reasons to return to MAU -has f/u appt with Dr. Mora Appl tomorrow  2. Preterm uterine contractions in third trimester, antepartum  -cervix closed & symptoms improved. Suspect BH exacerbated by dehydration  3. Decreased fetal movements in third trimester, single or unspecified fetus  -pt reassured by reactive fetal tracing & increase in movement while in MAU  4. NST (non-stress test) reactive   5. [redacted] weeks gestation of pregnancy      Judeth Horn 01/17/2021, 6:03 PM

## 2021-01-18 LAB — CULTURE, OB URINE: Special Requests: NORMAL

## 2021-02-08 ENCOUNTER — Other Ambulatory Visit: Payer: Self-pay | Admitting: Obstetrics & Gynecology

## 2021-02-08 LAB — OB RESULTS CONSOLE GBS: GBS: NEGATIVE

## 2021-02-13 ENCOUNTER — Encounter (HOSPITAL_COMMUNITY): Payer: Self-pay | Admitting: *Deleted

## 2021-02-13 ENCOUNTER — Telehealth (HOSPITAL_COMMUNITY): Payer: Self-pay | Admitting: *Deleted

## 2021-02-13 NOTE — Telephone Encounter (Signed)
Preadmission screen  

## 2021-02-14 ENCOUNTER — Other Ambulatory Visit (HOSPITAL_COMMUNITY)
Admission: RE | Admit: 2021-02-14 | Discharge: 2021-02-14 | Disposition: A | Discharge status: Home / Self Care | Payer: 59 | Source: Ambulatory Visit | Attending: Obstetrics & Gynecology | Admitting: Obstetrics & Gynecology

## 2021-02-14 DIAGNOSIS — Z20822 Contact with and (suspected) exposure to covid-19: Secondary | ICD-10-CM | POA: Insufficient documentation

## 2021-02-14 DIAGNOSIS — Z01812 Encounter for preprocedural laboratory examination: Secondary | ICD-10-CM | POA: Insufficient documentation

## 2021-02-14 LAB — SARS CORONAVIRUS 2 (TAT 6-24 HRS): SARS Coronavirus 2: NEGATIVE

## 2021-02-16 ENCOUNTER — Other Ambulatory Visit: Payer: Self-pay

## 2021-02-16 ENCOUNTER — Encounter (HOSPITAL_COMMUNITY): Payer: Self-pay

## 2021-02-16 ENCOUNTER — Inpatient Hospital Stay (HOSPITAL_COMMUNITY)
Admission: AD | Admit: 2021-02-16 | Discharge: 2021-02-20 | DRG: 807 | Disposition: A | Discharge status: Home / Self Care | Payer: 59 | Attending: Obstetrics and Gynecology | Admitting: Obstetrics and Gynecology

## 2021-02-16 ENCOUNTER — Inpatient Hospital Stay (HOSPITAL_COMMUNITY): Payer: 59

## 2021-02-16 DIAGNOSIS — O134 Gestational [pregnancy-induced] hypertension without significant proteinuria, complicating childbirth: Principal | ICD-10-CM | POA: Diagnosis present

## 2021-02-16 DIAGNOSIS — Z3A37 37 weeks gestation of pregnancy: Secondary | ICD-10-CM | POA: Diagnosis not present

## 2021-02-16 DIAGNOSIS — Z20822 Contact with and (suspected) exposure to covid-19: Secondary | ICD-10-CM | POA: Diagnosis present

## 2021-02-16 DIAGNOSIS — O133 Gestational [pregnancy-induced] hypertension without significant proteinuria, third trimester: Secondary | ICD-10-CM | POA: Diagnosis present

## 2021-02-16 DIAGNOSIS — Z87891 Personal history of nicotine dependence: Secondary | ICD-10-CM | POA: Diagnosis not present

## 2021-02-16 DIAGNOSIS — Z8659 Personal history of other mental and behavioral disorders: Secondary | ICD-10-CM

## 2021-02-16 LAB — COMPREHENSIVE METABOLIC PANEL
ALT: 12 U/L (ref 0–44)
AST: 17 U/L (ref 15–41)
Albumin: 2.5 g/dL — ABNORMAL LOW (ref 3.5–5.0)
Alkaline Phosphatase: 70 U/L (ref 38–126)
Anion gap: 9 (ref 5–15)
BUN: 7 mg/dL (ref 6–20)
CO2: 21 mmol/L — ABNORMAL LOW (ref 22–32)
Calcium: 8.7 mg/dL — ABNORMAL LOW (ref 8.9–10.3)
Chloride: 104 mmol/L (ref 98–111)
Creatinine, Ser: 0.6 mg/dL (ref 0.44–1.00)
GFR, Estimated: >60 mL/min (ref >60)
Glucose, Bld: 86 mg/dL (ref 70–99)
Potassium: 4.2 mmol/L (ref 3.5–5.1)
Sodium: 134 mmol/L — ABNORMAL LOW (ref 135–145)
Total Bilirubin: 0.3 mg/dL (ref 0.3–1.2)
Total Protein: 5.3 g/dL — ABNORMAL LOW (ref 6.5–8.1)

## 2021-02-16 LAB — CBC
HCT: 34.1 % — ABNORMAL LOW (ref 36.0–46.0)
Hemoglobin: 10.9 g/dL — ABNORMAL LOW (ref 12.0–15.0)
MCH: 27 pg (ref 26.0–34.0)
MCHC: 32 g/dL (ref 30.0–36.0)
MCV: 84.6 fL (ref 80.0–100.0)
Platelets: 178 10*3/uL (ref 150–400)
RBC: 4.03 MIL/uL (ref 3.87–5.11)
RDW: 17.6 % — ABNORMAL HIGH (ref 11.5–15.5)
WBC: 8.1 10*3/uL (ref 4.0–10.5)
nRBC: 0 % (ref 0.0–0.2)

## 2021-02-16 LAB — PROTEIN / CREATININE RATIO, URINE
Creatinine, Urine: 148.58 mg/dL
Protein Creatinine Ratio: 0.17 mg/mg{Cre} — ABNORMAL HIGH (ref 0.00–0.15)
Total Protein, Urine: 25 mg/dL

## 2021-02-16 LAB — TYPE AND SCREEN
ABO/RH(D): A POS
Antibody Screen: NEGATIVE

## 2021-02-16 MED ORDER — ONDANSETRON HCL 4 MG/2ML IJ SOLN
4.0000 mg | Freq: Four times a day (QID) | INTRAMUSCULAR | Status: DC | PRN
  Administered 2021-02-16 – 2021-02-17 (×6): 4 mg via INTRAVENOUS
  Filled 2021-02-16 (×7): qty 2

## 2021-02-16 MED ORDER — TERBUTALINE SULFATE 1 MG/ML IJ SOLN: 0.2500 mg | Freq: Once | INTRAMUSCULAR | Status: DC | PRN

## 2021-02-16 MED ORDER — ACETAMINOPHEN 325 MG PO TABS
650.0000 mg | ORAL_TABLET | ORAL | Status: DC | PRN
  Administered 2021-02-16: 650 mg via ORAL
  Filled 2021-02-16: qty 2

## 2021-02-16 MED ORDER — OXYCODONE-ACETAMINOPHEN 5-325 MG PO TABS: 1.0000 | ORAL_TABLET | ORAL | Status: DC | PRN

## 2021-02-16 MED ORDER — FENTANYL-BUPIVACAINE-NACL 0.5-0.125-0.9 MG/250ML-% EP SOLN
12.0000 mL/h | EPIDURAL | Status: DC | PRN
  Administered 2021-02-17 (×2): 12 mL/h via EPIDURAL
  Filled 2021-02-16 (×2): qty 250

## 2021-02-16 MED ORDER — LIDOCAINE HCL (PF) 1 % IJ SOLN: 30.0000 mL | INTRAMUSCULAR | Status: DC | PRN

## 2021-02-16 MED ORDER — EPHEDRINE 5 MG/ML INJ: 10.0000 mg | INTRAVENOUS | Status: DC | PRN

## 2021-02-16 MED ORDER — LACTATED RINGERS IV SOLN: 500.0000 mL | INTRAVENOUS | Status: DC | PRN

## 2021-02-16 MED ORDER — MISOPROSTOL 50MCG HALF TABLET
50.0000 ug | ORAL_TABLET | ORAL | Status: DC
  Administered 2021-02-16 (×2): 50 ug via ORAL
  Filled 2021-02-16 (×2): qty 1

## 2021-02-16 MED ORDER — PHENYLEPHRINE 40 MCG/ML (10ML) SYRINGE FOR IV PUSH (FOR BLOOD PRESSURE SUPPORT)
80.0000 ug | PREFILLED_SYRINGE | INTRAVENOUS | Status: DC | PRN
  Filled 2021-02-16: qty 10

## 2021-02-16 MED ORDER — LACTATED RINGERS IV SOLN
500.0000 mL | Freq: Once | INTRAVENOUS | Status: AC
  Administered 2021-02-17: 500 mL via INTRAVENOUS

## 2021-02-16 MED ORDER — LACTATED RINGERS IV SOLN: INTRAVENOUS | Status: DC

## 2021-02-16 MED ORDER — OXYTOCIN BOLUS FROM INFUSION
333.0000 mL | Freq: Once | INTRAVENOUS | Status: AC
  Administered 2021-02-18: 333 mL via INTRAVENOUS

## 2021-02-16 MED ORDER — OXYCODONE-ACETAMINOPHEN 5-325 MG PO TABS: 2.0000 | ORAL_TABLET | ORAL | Status: DC | PRN

## 2021-02-16 MED ORDER — SOD CITRATE-CITRIC ACID 500-334 MG/5ML PO SOLN: 30.0000 mL | ORAL | Status: DC | PRN

## 2021-02-16 MED ORDER — PHENYLEPHRINE 40 MCG/ML (10ML) SYRINGE FOR IV PUSH (FOR BLOOD PRESSURE SUPPORT): 80.0000 ug | PREFILLED_SYRINGE | INTRAVENOUS | Status: DC | PRN

## 2021-02-16 MED ORDER — FLEET ENEMA 7-19 GM/118ML RE ENEM: 1.0000 | ENEMA | RECTAL | Status: DC | PRN

## 2021-02-16 MED ORDER — EPHEDRINE 5 MG/ML INJ
10.0000 mg | INTRAVENOUS | Status: DC | PRN
  Filled 2021-02-16: qty 10

## 2021-02-16 MED ORDER — OXYTOCIN-SODIUM CHLORIDE 30-0.9 UT/500ML-% IV SOLN
1.0000 m[IU]/min | INTRAVENOUS | Status: DC
  Administered 2021-02-16: 2 m[IU]/min via INTRAVENOUS
  Administered 2021-02-17: 1 m[IU]/min via INTRAVENOUS
  Filled 2021-02-16: qty 500

## 2021-02-16 MED ORDER — OXYTOCIN-SODIUM CHLORIDE 30-0.9 UT/500ML-% IV SOLN
2.5000 [IU]/h | INTRAVENOUS | Status: DC
  Administered 2021-02-18: 2.5 [IU]/h via INTRAVENOUS

## 2021-02-16 MED ORDER — FENTANYL CITRATE (PF) 100 MCG/2ML IJ SOLN
50.0000 ug | INTRAMUSCULAR | Status: DC | PRN
  Administered 2021-02-16 (×3): 100 ug via INTRAVENOUS
  Filled 2021-02-16 (×3): qty 2

## 2021-02-16 MED ORDER — DIPHENHYDRAMINE HCL 50 MG/ML IJ SOLN: 12.5000 mg | INTRAMUSCULAR | Status: DC | PRN

## 2021-02-16 NOTE — Progress Notes (Signed)
Subjective:    SVE with membrane sweep and pt tolerated well. Discussed starting pitocin and pt agrees. Planning epidural for pain management.   Objective:    VS: BP (!) 141/79   Pulse 82   Temp 97.7 F (36.5 C) (Oral)   Resp 18   Ht 5\' 3"  (1.6 m)   Wt 104 kg   LMP 12/09/2019 (Exact Date)   BMI 40.62 kg/m  FHR : baseline 125 / variability moderate / accelerations present / absent decelerations Toco: contractions every 2-3 minutes Membranes: intact Dilation: 2 (Provider at bedside.) Effacement (%): 50 Cervical Position: Posterior Station: -2 Presentation: Vertex Exam by:: [email or web address], CNM (verbal order to start pit.)   Assessment/Plan:   40 y.o. 24 [redacted]w[redacted]d IOL for GHTN  Labor:  S/P Cytotec x2, starting Pitocin Preeclampsia:  no signs or symptoms of toxicity and intake and ouput balanced Fetal Wellbeing:  Category I Pain Control:   plans epidural I/D:   GBS neg Anticipated MOD:  NSVD  [redacted]w[redacted]d MSN, CNM 02/16/2021 9:54 PM

## 2021-02-16 NOTE — H&P (Signed)
OB ADMISSION/ HISTORY & PHYSICAL:  Admission Date: 02/16/2021 10:25 AM  Admit Diagnosis: Gestational hypertension  Jocelyn Waters is a 40 y.o. female [id number] [redacted]w[redacted]d presenting for IOL for . Endorses active FM, denies LOF and vaginal bleeding. Hx of pre-eclampsia and GHTN. Met criteria for GHTN this pregnancy without significant proteinuria.  History of current pregnancy: [id number]   Patient entered care with [facility] at 10+4 wks.   EDC 03/09/21 by LMP and congruent w/ 6+4 wk U/S.   Anatomy scan:  19 wks, incomplete w/ posterior placenta. F/U anatomy complete @ 23 wks Antenatal testing: for GHTN started at 32 weeks Last evaluation: Growth @ 32+6  wks, EFW 7# 1 oz >98% Last BPP 8/8 done on 02/08/21   Significant prenatal events:  Patient Active Problem List   Diagnosis Date Noted   Gestational hypertension w/o significant proteinuria in 3rd trimester 02/16/2021   Anxiety disorder 06/15/2020   Bipolar disorder ([place])    ADD (attention deficit disorder)     Prenatal Labs: ABO, Rh: --/--/A POS (07/15 1140) Antibody: NEG (07/15 1140) Rubella:   immune RPR:   NR HBsAg: NON REACTIVE (12/09 1355)  HIV:   NR GTT: passed 1 hr GBS:   neg GC/CHL: neg/neg Genetics: low-risk female Tdap/influenza vaccines: both current   OB History  Gravida Para Term Preterm AB Living  $Remov'6 2 2 'fXVXQp$ 0 3 2  SAB IAB Ectopic Multiple Live Births  2 1 0 0 2    # Outcome Date GA Lbr Len/2nd Weight Sex Delivery Anes PTL Lv  6 Current           5 SAB 10/13/19          4 SAB 07/31/19          3 Term 12/03/16 [redacted]w[redacted]d 06:13 / 00:18 3016 g M Vag-Spont EPI  LIV  2 Term 12/09/14 [redacted]w[redacted]d 07:36 / 02:09 2950 g F Vag-Spont EPI  LIV  1 IAB             Medical / Surgical History: Past medical history:  Past Medical History:  Diagnosis Date   ADD (attention deficit disorder)    Anxiety    Depression    History of abnormal cervical Pap smear 2007   History of gestational hypertension    History of kidney stones 2016   History of  pre-eclampsia    History of pregnancy induced hypertension    Hx of varicella    PONV (postoperative nausea and vomiting)    Pregnancy induced hypertension     Past surgical history:  Past Surgical History:  Procedure Laterality Date   CHOLECYSTECTOMY N/A 12/16/2019   Procedure: LAPAROSCOPIC CHOLECYSTECTOMY;  Surgeon: Rolm Bookbinder, MD;  Location: [place];  Service: General;  Laterality: N/A;  GENERAL WITH BILATERAL TAP BLOCK   DILATION AND EVACUATION N/A 10/13/2019   Procedure: SUCTION  DILATATION AND EVACUATION WITH CHROMOSOME STUDIES;  Surgeon: Sanjuana Kava, MD;  Location: [place];  Service: Gynecology;  Laterality: N/A;   KNEE ARTHROSCOPY Right 2000   LAPAROSCOPIC APPENDECTOMY N/A 02/10/2020   Procedure: APPENDECTOMY LAPAROSCOPIC;  Surgeon: Armandina Gemma, MD;  Location: [facility];  Service: General;  Laterality: N/A;   THERAPEUTIC ABORTION  05/2006   WISDOM TOOTH EXTRACTION  teen   Family History:  Family History  Problem Relation Age of Onset   Diabetes Maternal Aunt    Hypertension Maternal Grandmother    Hypertension Paternal Grandmother    Diabetes Paternal Grandfather  Social History:  reports that she quit smoking about 8 years ago. Her smoking use included cigarettes. She has never used smokeless tobacco. She reports previous alcohol use. She reports that she does not use drugs.  Allergies: Sulfamethoxazole-trimethoprim, Biaxin [clarithromycin], Septra [bactrim], and Sulfa antibiotics   Current Medications at time of admission:  Prior to Admission medications   Medication Sig Start Date End Date Taking? Authorizing Provider  cetirizine (ZYRTEC) 10 MG tablet Take 10 mg by mouth daily.   Yes [provider]  FEROSUL 325 (65 Fe) MG tablet Take 325 mg by mouth 2 (two) times daily. 12/18/20  Yes [provider]  FLUoxetine (PROZAC) 40 MG capsule Take 40 mg by mouth daily. 10/21/19  Yes [provider]   ondansetron (ZOFRAN-ODT) 8 MG disintegrating tablet Take by mouth. 01/08/21  Yes [provider]  pantoprazole (PROTONIX) 40 MG tablet Take 1 tablet by mouth daily. 11/05/20  Yes [provider]  promethazine (PHENERGAN) 25 MG tablet Take 1 tablet (25 mg total) by mouth every 6 (six) hours as needed for nausea or vomiting. 11/16/20  Yes Jorje Guild, NP  ondansetron (ZOFRAN) 8 MG tablet Take by mouth every 8 (eight) hours as needed for nausea or vomiting.    [provider]    Review of Systems: Constitutional: Negative   HENT: Negative   Eyes: Negative   Respiratory: Negative   Cardiovascular: Negative   Gastrointestinal: Negative  Genitourinary: neg for bloody show, neg for LOF   Musculoskeletal: Negative   Skin: Negative   Neurological: Negative   Endo/Heme/Allergies: Negative   Psychiatric/Behavioral: Negative    Physical Exam: VS: Blood pressure (!) 143/75, pulse 90, temperature 98.7 F (37.1 C), temperature source Oral, resp. rate 18, height $RemoveBe'5\' 3"'FpaPZvASB$  (1.6 m), weight 104 kg, last menstrual period 12/09/2019. AAO x3, no signs of distress Cardiovascular: RRR Respiratory: Lung fields clear to ausculation GU/GI: Abdomen gravid, non-tender, non-distended, active FM, vertex, EFW 8# per Leopold's Extremities: no edema, negative for pain, tenderness, and cords  Cervical exam:Dilation: 1 Effacement (%): 20 Station: Ballotable Exam by:: Burman Foster CNM FHR: baseline rate 135 / variability moderate / accelerations present / absent decelerations TOCO: irreg   Prenatal Transfer Tool  Maternal Diabetes: No Genetic Screening: Normal Maternal Ultrasounds/Referrals: Normal Fetal Ultrasounds or other Referrals:  None Maternal Substance Abuse:  No Significant Maternal Medications:  None Significant Maternal Lab Results: Group B Strep negative    Assessment: 40 y.o. [id number] [redacted]w[redacted]d IOL for GHTN  FHR category 1 GBS neg Pain management plan:  epidural   Plan:  Admit to L&D Routine admission orders Epidural PRN Cytotec PO 50 mcg Q 4 hr for cervical ripening Dr Simona Huh notified of admission and plan of care  Arrie Eastern MSN, CNM 02/16/2021 12:01 PM

## 2021-02-17 ENCOUNTER — Inpatient Hospital Stay (HOSPITAL_COMMUNITY): Payer: 59 | Admitting: Anesthesiology

## 2021-02-17 LAB — RPR: RPR Ser Ql: NONREACTIVE

## 2021-02-17 MED ORDER — SCOPOLAMINE 1 MG/3DAYS TD PT72
1.0000 | MEDICATED_PATCH | TRANSDERMAL | Status: DC
  Administered 2021-02-17: 1.5 mg via TRANSDERMAL
  Filled 2021-02-17: qty 1

## 2021-02-17 MED ORDER — LACTATED RINGERS IV SOLN: INTRAVENOUS | Status: DC

## 2021-02-17 MED ORDER — LIDOCAINE HCL (PF) 1 % IJ SOLN
INTRAMUSCULAR | Status: DC | PRN
  Administered 2021-02-17: 10 mL via EPIDURAL

## 2021-02-17 MED ORDER — BUPIVACAINE HCL (PF) 0.25 % IJ SOLN
INTRAMUSCULAR | Status: DC | PRN
  Administered 2021-02-17 (×2): 5 mL via EPIDURAL

## 2021-02-17 NOTE — Anesthesia Procedure Notes (Signed)
Epidural Patient location during procedure: OB Start time: 02/17/2021 1:00 AM End time: 02/17/2021 1:09 AM  Staffing Anesthesiologist: Lucretia Kern, MD Performed: anesthesiologist   Preanesthetic Checklist Completed: patient identified, IV checked, risks and benefits discussed, monitors and equipment checked, pre-op evaluation and timeout performed  Epidural Patient position: sitting Prep: DuraPrep Patient monitoring: heart rate, continuous pulse ox and blood pressure Approach: midline Location: L3-L4 Injection technique: LOR air  Needle:  Needle type: Tuohy  Needle gauge: 17 G Needle length: 9 cm Needle insertion depth: 6 cm Catheter type: closed end flexible Catheter size: 19 Gauge Catheter at skin depth: 11 cm Test dose: negative  Assessment Events: blood not aspirated, injection not painful, no injection resistance, no paresthesia and negative IV test  Additional Notes Reason for block:procedure for pain

## 2021-02-17 NOTE — Progress Notes (Signed)
Subjective:    Comfortable with epidural Mild nausea Dr. Richardson Dopp at bedside for AROM assistance  Objective:    VS: BP 120/74   Pulse 81   Temp 98.1 F (36.7 C) (Oral)   Resp 16   Ht 5\' 3"  (1.6 m)   Wt 104 kg   LMP 12/09/2019 (Exact Date)   SpO2 97%   BMI 40.62 kg/m  FHR : baseline 140 / variability moderate / accelerations present/ decelerations absent Toco: contractions every 2-3 minutes Membranes: intact, bloody show 4-5/75/-3 AROM complete by Dr. 08-18-1980, copious amounts of clear fluid @ 2019 IUPC placed by CNM  Assessment/Plan:   40 y.o. 24 101w0d IOL for GHTN  Labor: s/p cytotec x 2. Pitocin infusing, AROM complete. Continue pitocin per protocol and maternal position changes. Consider MD assist with slow leak AROM if no successful ROM Preeclampsia:  no signs or symptoms of toxicity and intake and ouput balanced Fetal Wellbeing:  Category I Pain Control:  comfortable with epidural I/D:   GBS neg Anticipated MOD:  NSVD  [redacted]w[redacted]d Tunya Held MSN, CNM 02/17/2021 8:50 PM

## 2021-02-17 NOTE — Anesthesia Preprocedure Evaluation (Signed)
Anesthesia Evaluation  Patient identified by MRN, date of birth, ID band Patient awake    Reviewed: Allergy & Precautions, H&P , NPO status , Patient's Chart, lab work & pertinent test results  History of Anesthesia Complications Negative for: history of anesthetic complications  Airway Mallampati: II  TM Distance: >3 FB     Dental   Pulmonary neg pulmonary ROS, former smoker,    Pulmonary exam normal        Cardiovascular hypertension (pre-e),  Rhythm:regular Rate:Normal     Neuro/Psych Anxiety Depression Bipolar Disorder negative neurological ROS  negative psych ROS   GI/Hepatic negative GI ROS, Neg liver ROS,   Endo/Other  Morbid obesity  Renal/GU      Musculoskeletal   Abdominal   Peds  Hematology  (+) Blood dyscrasia, anemia ,   Anesthesia Other Findings   Reproductive/Obstetrics (+) Pregnancy                             Anesthesia Physical Anesthesia Plan  ASA: 3  Anesthesia Plan: Epidural   Post-op Pain Management:    Induction:   PONV Risk Score and Plan:   Airway Management Planned:   Additional Equipment:   Intra-op Plan:   Post-operative Plan:   Informed Consent: I have reviewed the patients History and Physical, chart, labs and discussed the procedure including the risks, benefits and alternatives for the proposed anesthesia with the patient or authorized representative who has indicated his/her understanding and acceptance.       Plan Discussed with:   Anesthesia Plan Comments:         Anesthesia Quick Evaluation

## 2021-02-17 NOTE — Progress Notes (Signed)
Subjective:    Epidural recently replaced and now comfortable Mild nausea  Objective:    VS: BP (!) 117/52   Pulse 70   Temp 98.3 F (36.8 C) (Oral)   Resp 18   Ht 5\' 3"  (1.6 m)   Wt 104 kg   LMP 12/09/2019 (Exact Date)   BMI 40.62 kg/m  FHR : baseline 130 / variability moderate / accelerations present / absent decelerations Toco: contractions every 2-3 minutes Membranes: intact, bloody show 4-5/75/-3 AROM attempt unsuccessful.   Assessment/Plan:   40 y.o. 24 110w0d IOL for GHTN  Labor: s/p cytotec x 2. Pitocin infusing. Unsuccessful AROM attempt. Continue pitocin per protocol and maternal position changes. Consider MD assist with slow leak AROM if no successful ROM Preeclampsia:  no signs or symptoms of toxicity and intake and ouput balanced Fetal Wellbeing:  Category I Pain Control:  comfortable with epidural I/D:   GBS neg Anticipated MOD:  NSVD  [redacted]w[redacted]d MSN, CNM 02/17/2021 3:44 PM

## 2021-02-17 NOTE — Progress Notes (Signed)
Subjective:    Comfortable with Epidural  Objective:    VS: BP 130/80   Pulse 79   Temp (!) 97.5 F (36.4 C) (Oral)   Resp 18   Ht 5\' 3"  (1.6 m)   Wt 104 kg   LMP 12/09/2019 (Exact Date)   BMI 40.62 kg/m  FHR : baseline 130 / variability moderate / accelerations present / absent decelerations Toco: contractions every 2-3 minutes Membranes: intact 3-4/75/-3 Bedside 05-20-1990 performed and confirms vertex.    Assessment/Plan:   40 y.o. 24 [redacted]w[redacted]d IOL for GHTN  Labor: s/p cytotec x 2. Pitocin infusing. Presenting part too high for AROM. Continue pitocin per protocol and maternal position changes Preeclampsia:  no signs or symptoms of toxicity and intake and ouput balanced Fetal Wellbeing:  Category I Pain Control:  comfortable with epidural I/D:   GBS neg Anticipated MOD:  NSVD  [redacted]w[redacted]d Dabney Schanz MSN, CNM 02/17/2021 8:46 AM

## 2021-02-17 NOTE — Progress Notes (Addendum)
Subjective:    Epidural recently replaced and now comfortable Mild nausea Called by RN for FHR tracing review Pitocin infusing 13 mU  Objective:    VS: BP (!) 109/57   Pulse 68   Temp 98.3 F (36.8 C) (Oral)   Resp 18   Ht 5\' 3"  (1.6 m)   Wt 104 kg   LMP 12/09/2019 (Exact Date)   SpO2 97%   BMI 40.62 kg/m  FHR : baseline 150 / variability moderate / accelerations absent / repetitive variable decelerations present despite nursing intervention. Decrease pitocin to half Toco: contractions every 2-3 minutes Membranes: intact, bloody show 4-5/75/-3 AROM attempt unsuccessful.   Assessment/Plan:   40 y.o. 24 [redacted]w[redacted]d IOL for GHTN  Labor: s/p cytotec x 2. Pitocin infusing, decrease to half for FHR recovery successful. Previous unsuccessful AROM attempt. Continue pitocin per protocol and maternal position changes. Consider MD assist with slow leak AROM if no successful ROM Preeclampsia:  no signs or symptoms of toxicity and intake and ouput balanced Fetal Wellbeing:  Category II Pain Control:  comfortable with epidural I/D:   GBS neg Anticipated MOD:  NSVD  [redacted]w[redacted]d Katrin Grabel MSN, CNM 02/17/2021 4:58 PM

## 2021-02-18 ENCOUNTER — Encounter (HOSPITAL_COMMUNITY): Payer: Self-pay | Admitting: Obstetrics and Gynecology

## 2021-02-18 LAB — CBC
HCT: 33 % — ABNORMAL LOW (ref 36.0–46.0)
HCT: 32.6 % — ABNORMAL LOW (ref 36.0–46.0)
Hemoglobin: 10.4 g/dL — ABNORMAL LOW (ref 12.0–15.0)
Hemoglobin: 10.5 g/dL — ABNORMAL LOW (ref 12.0–15.0)
MCH: 26.5 pg (ref 26.0–34.0)
MCH: 27 pg (ref 26.0–34.0)
MCHC: 31.5 g/dL (ref 30.0–36.0)
MCHC: 32.2 g/dL (ref 30.0–36.0)
MCV: 84 fL (ref 80.0–100.0)
MCV: 83.8 fL (ref 80.0–100.0)
Platelets: 174 10*3/uL (ref 150–400)
Platelets: 173 10*3/uL (ref 150–400)
RBC: 3.93 MIL/uL (ref 3.87–5.11)
RBC: 3.89 MIL/uL (ref 3.87–5.11)
RDW: 17.5 % — ABNORMAL HIGH (ref 11.5–15.5)
RDW: 17.5 % — ABNORMAL HIGH (ref 11.5–15.5)
WBC: 15.3 10*3/uL — ABNORMAL HIGH (ref 4.0–10.5)
WBC: 15.2 10*3/uL — ABNORMAL HIGH (ref 4.0–10.5)
nRBC: 0 % (ref 0.0–0.2)
nRBC: 0 % (ref 0.0–0.2)

## 2021-02-18 MED ORDER — WITCH HAZEL-GLYCERIN EX PADS: 1.0000 "application " | MEDICATED_PAD | CUTANEOUS | Status: DC | PRN

## 2021-02-18 MED ORDER — ONDANSETRON HCL 4 MG/2ML IJ SOLN
4.0000 mg | Freq: Once | INTRAMUSCULAR | Status: AC
  Administered 2021-02-18: 4 mg via INTRAVENOUS

## 2021-02-18 MED ORDER — IBUPROFEN 600 MG PO TABS
600.0000 mg | ORAL_TABLET | Freq: Four times a day (QID) | ORAL | Status: DC
  Administered 2021-02-18 – 2021-02-20 (×9): 600 mg via ORAL
  Filled 2021-02-18 (×9): qty 1

## 2021-02-18 MED ORDER — OXYCODONE-ACETAMINOPHEN 5-325 MG PO TABS
1.0000 | ORAL_TABLET | ORAL | Status: DC | PRN
  Administered 2021-02-18 – 2021-02-19 (×2): 1 via ORAL
  Filled 2021-02-18 (×2): qty 1

## 2021-02-18 MED ORDER — ONDANSETRON HCL 4 MG PO TABS: 4.0000 mg | ORAL_TABLET | ORAL | Status: DC | PRN

## 2021-02-18 MED ORDER — BENZOCAINE-MENTHOL 20-0.5 % EX AERO
1.0000 "application " | INHALATION_SPRAY | CUTANEOUS | Status: DC | PRN
  Administered 2021-02-18: 1 via TOPICAL
  Filled 2021-02-18: qty 56

## 2021-02-18 MED ORDER — DIPHENHYDRAMINE HCL 25 MG PO CAPS: 25.0000 mg | ORAL_CAPSULE | Freq: Four times a day (QID) | ORAL | Status: DC | PRN

## 2021-02-18 MED ORDER — MEDROXYPROGESTERONE ACETATE 150 MG/ML IM SUSP: 150.0000 mg | INTRAMUSCULAR | Status: DC | PRN

## 2021-02-18 MED ORDER — COCONUT OIL OIL: 1.0000 "application " | TOPICAL_OIL | Status: DC | PRN

## 2021-02-18 MED ORDER — PROMETHAZINE HCL 25 MG PO TABS
25.0000 mg | ORAL_TABLET | Freq: Four times a day (QID) | ORAL | Status: DC | PRN
  Filled 2021-02-18: qty 1

## 2021-02-18 MED ORDER — SENNOSIDES-DOCUSATE SODIUM 8.6-50 MG PO TABS
2.0000 | ORAL_TABLET | Freq: Every day | ORAL | Status: DC
  Administered 2021-02-19 – 2021-02-20 (×2): 2 via ORAL
  Filled 2021-02-18 (×2): qty 2

## 2021-02-18 MED ORDER — SIMETHICONE 80 MG PO CHEW: 80.0000 mg | CHEWABLE_TABLET | ORAL | Status: DC | PRN

## 2021-02-18 MED ORDER — TETANUS-DIPHTH-ACELL PERTUSSIS 5-2.5-18.5 LF-MCG/0.5 IM SUSY: 0.5000 mL | PREFILLED_SYRINGE | Freq: Once | INTRAMUSCULAR | Status: DC

## 2021-02-18 MED ORDER — ONDANSETRON HCL 4 MG/2ML IJ SOLN: 4.0000 mg | INTRAMUSCULAR | Status: DC | PRN

## 2021-02-18 MED ORDER — BUTORPHANOL TARTRATE 1 MG/ML IJ SOLN
2.0000 mg | Freq: Once | INTRAMUSCULAR | Status: AC
Start: 2021-02-18 — End: 2021-02-18
  Administered 2021-02-18: 2 mg via INTRAVENOUS
  Filled 2021-02-18: qty 2

## 2021-02-18 MED ORDER — SODIUM CHLORIDE 0.9 % IV SOLN
12.5000 mg | Freq: Once | INTRAVENOUS | Status: AC
  Administered 2021-02-18: 12.5 mg via INTRAVENOUS
  Filled 2021-02-18: qty 0.5

## 2021-02-18 MED ORDER — DIBUCAINE (PERIANAL) 1 % EX OINT: 1.0000 "application " | TOPICAL_OINTMENT | CUTANEOUS | Status: DC | PRN

## 2021-02-18 MED ORDER — SODIUM CHLORIDE 0.9 % IV SOLN
3.0000 g | Freq: Once | INTRAVENOUS | Status: AC
  Administered 2021-02-18: 3 g via INTRAVENOUS
  Filled 2021-02-18: qty 3

## 2021-02-18 MED ORDER — ACETAMINOPHEN 325 MG PO TABS
650.0000 mg | ORAL_TABLET | ORAL | Status: DC | PRN
  Administered 2021-02-18 (×3): 650 mg via ORAL
  Filled 2021-02-18 (×3): qty 2

## 2021-02-18 MED ORDER — ZOLPIDEM TARTRATE 5 MG PO TABS: 5.0000 mg | ORAL_TABLET | Freq: Every evening | ORAL | Status: DC | PRN

## 2021-02-18 MED ORDER — PRENATAL MULTIVITAMIN CH
1.0000 | ORAL_TABLET | Freq: Every day | ORAL | Status: DC
  Administered 2021-02-18 – 2021-02-19 (×2): 1 via ORAL
  Filled 2021-02-18 (×2): qty 1

## 2021-02-18 NOTE — Progress Notes (Addendum)
The Rn called Jocelyn Waters Midwife regarding reviewing postpartum orders,. She stated that she would review the orders in the am. They appear to be incomplete are not reconciled.

## 2021-02-18 NOTE — Plan of Care (Signed)
  Problem: Education: Goal: Knowledge of condition will improve Outcome: Completed/Met

## 2021-02-18 NOTE — Lactation Note (Signed)
This note was copied from a baby's chart. Lactation Consultation Note  Patient Name: Jocelyn Waters [id number] Date: 02/18/2021 Reason for consult: Initial assessment;Early term 37-38.6wks Age:39 hours  Follow up with 9 hours old infant. Mother is a P3 with breastfeeding and pumping because other children were born at 37 weeks too. Infant is skin to skin upon arrival. Mother states newborn seems to be feeding well. Mother reports mild discomfort with latch. Talked about chin tug to improve latch.  Mother request DEBP to be set up for stimulation and supplementation. LC provided DEBP and discussed care of parts and milk storage.   Feeding plan:  1-Skin to skin 2-Aim for a deep, comfortable latch 3-Breastfeeding on demand or 8-12 times in 24h period. 4-Keep infant awake during breastfeeding session: massaging breast, infant's hand/shoulder/feet 5-Pump or hand-express and offer EBM prior to supplementation, if decided. 6-If decided, supplement following guidelines, paced bottle feeding and fullness cues.  7-Monitor voids and stools as signs good intake.  8-Encouraged maternal rest, hydration and food intake.  9-Contact LC as needed for feeds/support/concerns/questions  All question answered. Reviewed LC services brochure.    Maternal Data Has patient been taught Hand Expression?: No Does the patient have breastfeeding experience prior to this delivery?: Yes How long did the patient breastfeed?: 18 months, first child; 16 months, second child  Feeding Mother's Current Feeding Choice: Breast Milk  LATCH Score Latch: Repeated attempts needed to sustain latch, nipple held in mouth throughout feeding, stimulation needed to elicit sucking reflex.  Audible Swallowing: A few with stimulation  Type of Nipple: Everted at rest and after stimulation  Comfort (Breast/Nipple): Soft / non-tender  Hold (Positioning): Assistance needed to correctly position infant at breast and maintain  latch.  LATCH Score: 7   Lactation Tools Discussed/Used Tools: Pump;Flanges Flange Size: 24 Breast pump type: Double-Electric Breast Pump Pump Education: Setup, frequency, and cleaning;Milk Storage Reason for Pumping: mother's request Pumping frequency: as needed  Interventions Interventions: Breast feeding basics reviewed;Skin to skin;DEBP;Expressed milk;Education  Discharge Pump: Personal;DEBP WIC Program: No  Consult Status Consult Status: Follow-up Date: 02/19/21 Follow-up type: In-patient    Edilson Vital A Higuera Ancidey 02/18/2021, 10:25 AM

## 2021-02-18 NOTE — Anesthesia Postprocedure Evaluation (Signed)
Anesthesia Post Note  Patient: Jocelyn Waters  Procedure(s) Performed: AN AD HOC LABOR EPIDURAL     Patient location during evaluation: Mother Baby Anesthesia Type: Epidural Level of consciousness: awake and alert Pain management: pain level controlled Vital Signs Assessment: post-procedure vital signs reviewed and stable Respiratory status: spontaneous breathing, nonlabored ventilation and respiratory function stable Cardiovascular status: stable Postop Assessment: no headache, no backache, epidural receding, no apparent nausea or vomiting, patient able to bend at knees, adequate PO intake and able to ambulate Anesthetic complications: no   No notable events documented.  Last Vitals:  Vitals:   02/18/21 0433 02/18/21 0830  BP: (P) 132/75 123/68  Pulse: (P) 67 66  Resp: (P) 18 20  Temp: (P) 36.8 C 36.6 C  SpO2:      Last Pain:  Vitals:   02/18/21 1238  TempSrc:   PainSc: 6    Pain Goal: Patients Stated Pain Goal: 0 (02/16/21 1930)                 Laban Emperor

## 2021-02-19 DIAGNOSIS — Z8659 Personal history of other mental and behavioral disorders: Secondary | ICD-10-CM

## 2021-02-19 MED ORDER — FLUOXETINE HCL 20 MG PO CAPS
40.0000 mg | ORAL_CAPSULE | Freq: Every day | ORAL | Status: DC
  Administered 2021-02-19: 40 mg via ORAL
  Filled 2021-02-19 (×2): qty 2

## 2021-02-19 NOTE — Lactation Note (Signed)
This note was copied from a baby's chart. Lactation Consultation Note  Patient Name: Jocelyn Waters [id number] Date: 02/19/2021 Reason for consult: Follow-up assessment;Early term 37-38.6wks Age:39 hours, infant had 5 stools and 5 voids since birth, - 4% weight loss. Mom's current feeding choice is breast and formula feeding infant.  Mom is experienced at breastfeeding she BF her 1st child for 18 months, 2nd child who is 4 years for 16 months. Per mom, she has used the DEBP only once she saw only few drops of colostrum, LC explained that is normal that colostrum is in small amounts at first and thick. Mom knows how to hand express. Mom latched infant on her left breast using the cross cradle hold, infant latched with depth, swallows observed and infant was still breastfeeding after 8 minutes when LC left the room. LC discussed infant's input and output with parents. Mom's feeding plan: 1- Mom will continue to breastfeed infant according to feeding cues, 8 to 12+ or more times within 24 hours, STS. 2- After latching infant at breast mom plans to continue supplement infant with formula ( her choice ) Day 3 of life 18 to 25 mls per feeding.  3- Mom will start using DEBP every 3 hours for 15 minutes on initial setting, after latching infant at breast mom will offer infant any EBM first that is pumped before offering formula. 4- Mom knows to call RN or LC if she has any questions, concerns or needs assistance with latching infant at the breast.  Maternal Data    Feeding Mother's Current Feeding Choice: Breast Milk and Formula  LATCH Score Latch: Grasps breast easily, tongue down, lips flanged, rhythmical sucking.  Audible Swallowing: Spontaneous and intermittent  Type of Nipple: Everted at rest and after stimulation  Comfort (Breast/Nipple): Soft / non-tender  Hold (Positioning): Assistance needed to correctly position infant at breast and maintain latch.  LATCH Score: 9   Lactation  Tools Discussed/Used    Interventions Interventions: Breast massage;Skin to skin;Assisted with latch;Breast compression;Adjust position;Support pillows;Position options;Expressed milk;Education;DEBP  Discharge    Consult Status Consult Status: Follow-up Date: 02/20/21 Follow-up type: In-patient    Jocelyn Waters 02/19/2021, 11:45 PM

## 2021-02-19 NOTE — Clinical Social Work Maternal (Signed)
CLINICAL SOCIAL WORK MATERNAL/CHILD NOTE  Patient Details  Name: Jocelyn Waters MRN: [id number] Date of Birth: 11-29-1980  Date:  02/19/2021  Clinical Social Worker Initiating Note:  Kathrin Greathouse, [place] Date/Time: Initiated:  02/19/21/1143     Child's Name:  Darcus Pester   Biological Parents:  Mother, Father   Need for Interpreter:  None   Reason for Referral:  Behavioral Health Concerns   Address:  [address] [place] [address]    Phone number:  [phone] (home) [phone] (work)    Additional phone number:   Household Members/Support Persons (HM/SP):   Household Member/Support Person 1, Household Member/Support Person 2, Household Member/Support Person 3   HM/SP Name Relationship DOB or Age  HM/SP -1 Jocelyn Waters Spouse 06-27-1976  HM/SP -Jocelyn Waters Son 12-03-2016  HM/SP -3 Jocelyn Waters Daughter 05--01-2015  HM/SP -4        HM/SP -5        HM/SP -6        HM/SP -7        HM/SP -8          Natural Supports (not living in the home):  Parent   Professional Supports:     Employment: Animator   Type of Work: [place]   Education:  [place] arranged:    Museum/gallery curator Resources:  Multimedia programmer    Other Resources:      Cultural/Religious Considerations Which May Impact Care:    Strengths:  Ability to meet basic needs  , Home prepared for child  , Psychotropic Medications, Pediatrician chosen   Psychotropic Medications:  Prozac      Pediatrician:    [place]  Pediatrician List:   [facility] [place]      Pediatrician Fax Number:    Risk Factors/Current Problems:  Mental Health Concerns     Cognitive State:  Able to Concentrate  , Insightful  , Alert  , Linear Thinking     Mood/Affect:  Calm  , Relaxed  , Happy     CSW Assessment: CSW received consult for hx of  Anxiety, Depression, Bipolar and ADHD.  CSW met with MOB to offer support and complete assessment.   CSW met with MOB at bedside. CSW observed MOB holding and bonding with the infant. CSW congratulated MOB and FOB. CSW introduced CSW role and offered MOB privacy. MOB preferred that FOB stay during the assessment. MOB presented pleasant, calm and receptive to [place] visit. CSW confirmed the demographic information on file is correct. CSW asked MOB how she has felt since giving birth. MOB expressed, " I am feeling a whole lot better, just tired." CSW inquired about MOB history of anxiety, depression and bipolar. MOB acknowledged she has a history of depression, anxiety and ADHD. MOB reported the bipolar diagnoses is not correct. MOB reported she was diagnosed many years ago and has seen a psychiatrist in the same practice, [place] and they do not recognize bipolar as being a diagnosis. MOB reported she is taking Prozac $RemoveBefor'40mg'[id number]$  for her anxiety symptoms and will continue the medication postpartum. MOB reported she has not had any concerns with depression. CSW inquired about therapy. MOB reported she has not been to therapy in many years, she feels the medication is better than therapy.  CSW inquired if MOB  experienced postpartum with her older children. MOB reported she did not experience postpartum depression or anxiety with her older two children. CSW provided education regarding the baby blues period vs. perinatal mood disorders, discussed treatment and gave resources for mental health follow up if concerns arise. CSW recommended MOB complete a self-evaluation during the postpartum time period using the New Mom Checklist from Postpartum Progress and encouraged MOB to contact a medical professional if symptoms are noted at any time. MOB reports she feels comfortable reaching out to her physician if concerns arise. CSW assessed MOB for safety. MOB denied thoughts of harm and others. CSW inquired about  MOB supports. MOB acknowledged her spouse and mother as supports.   CSW provided review of Sudden Infant Death Syndrome (SIDS) precautions. MOB reports the infant will sleep in bassinet. MOB reports she has essential items for the infant. MOB has chosen [facility] for infant's follow up.   CSW identifies no further need for intervention and no barriers to discharge at this time.  CSW Plan/Description:  Perinatal Mood and Anxiety Disorder (PMADs) Education, Sudden Infant Death Syndrome (SIDS) Education, No Further Intervention Required/No Barriers to Discharge    Lia Hopping, LCSW 02/19/2021, 11:49 AM

## 2021-02-19 NOTE — Progress Notes (Signed)
Subjective: Postpartum Day # 1 : S/P NSVD due to pt was admitted on 7/15 for IOL for GHTN at 37 weeks, PCR 0.17, labs undetected, asymptomatic, no meds, h/o add anxiety, depression , mood stable denies hi/si on prozac 40mg  PO daily, pt progressed to SVD off cytotec, pitocin and AROM, over intact perineum, ebl was , hgb drop of 10.9-10.5, asymptomatic, stable. Patient up ad lib, denies syncope or dizziness. Reports consuming regular diet without issues and denies N/V. Patient reports 0 bowel movement + passing flatus.  Denies issues with urination and reports bleeding is "lighter."  Patient is breast and bottle feeding and reports going well.  Desires depo at 6 weeks PP for postpartum contraception.  Pain is being appropriately managed with use of po meds.   No laceration Feeding:  breast and bottle Contraceptive plan:  depo at 6 weeks Baby female  Objective: Vital signs in last 24 hours: Patient Vitals for the past 24 hrs:  BP Temp Temp src Pulse Resp SpO2  02/19/21 0543 127/68 97.8 F (36.6 C) Oral 61 18 95 %  02/18/21 2054 133/76 98.2 F (36.8 C) Oral 71 18 --  02/18/21 1803 139/72 97.7 F (36.5 C) -- 80 20 --     Physical Exam:  General: alert, cooperative, and appears stated age Mood/Affect: happy Lungs: clear to auscultation, no wheezes, rales or rhonchi, symmetric air entry.  Heart: normal rate, regular rhythm, normal S1, S2, no murmurs, rubs, clicks or gallops. Breast: breasts appear normal, no suspicious masses, no skin or nipple changes or axillary nodes. Abdomen:  + bowel sounds, soft, non-tender GU: perineum intact, healing well. No signs of external hematomas.  Uterine Fundus: firm Lochia: appropriate Skin: Warm, Dry. DVT Evaluation: No evidence of DVT seen on physical exam. Negative Homan's sign. No cords or calf tenderness. No significant calf/ankle edema.  CBC Latest Ref Rng & Units 02/18/2021 02/18/2021 02/16/2021  WBC 4.0 - 10.5 K/uL 15.2(H) 15.3(H) 8.1   Hemoglobin 12.0 - 15.0 g/dL 10.5(L) 10.4(L) 10.9(L)  Hematocrit 36.0 - 46.0 % 32.6(L) 33.0(L) 34.1(L)  Platelets 150 - 400 K/uL 173 174 178    No results found for this or any previous visit (from the past 24 hour(s)).   CBG (last 3)  No results for input(s): GLUCAP in the last 72 hours.   I/O last 3 completed shifts: In: 0  Out: 1350 [Urine:1150; Blood:200]   Assessment Postpartum Day # 1 : S/P NSVD due to pt was admitted on 7/15 for IOL for GHTN at 37 weeks, PCR 0.17, labs undetected, asymptomatic, no meds, h/o add anxiety, depression , mood stable denies hi/si on prozac 40mg  PO daily, pt progressed to SVD off cytotec, pitocin and AROM, over intact perineum, ebl was 8/15, hgb drop of 10.9-10.5, asymptomatic, stable. Pt stable. -1 involution. Breast and bottle feeding. Hemodynamically stable. Baby Female  Plan: Continue other mgmt as ordered VTE prophylactics: Early ambulated as tolerates.  Pain control: Motrin/Tylenol PRN GHTN: Monitor BP.  Education given regarding options for contraception, including barrier methods, injectable contraception, IUD placement, oral contraceptives.  Plan for discharge tomorrow, Breastfeeding, Lactation consult, and Contraception depo at 6 weeks  Dr. to be updated on patient status  [facility] NP-C, CNM 02/19/2021, 3:12 PM

## 2021-02-20 LAB — SURGICAL PATHOLOGY

## 2021-02-20 MED ORDER — IBUPROFEN 600 MG PO TABS: 600.0000 mg | ORAL_TABLET | Freq: Four times a day (QID) | ORAL | 0 refills | Status: AC

## 2021-02-20 NOTE — Discharge Summary (Signed)
SVD OB Discharge Summary     Patient Name: Jocelyn Waters DOB: May 15, 1981 MRN: [id number]  Date of admission: 02/16/2021 Delivering MD: Verdis Prime B  Date of delivery: 02/18/2021 Type of delivery: SVD  Newborn Data: Sex: Baby female Live born female  Birth Weight: 7 lb 4.4 oz (3300 g) APGAR: 8, 9  Newborn Delivery   Birth date/time: 02/18/2021 01:22:00 Delivery type: Vaginal, Spontaneous      Feeding: breast and bottle Infant being discharge to home with mother in stable condition.   Admitting diagnosis: Pre-eclampsia [O14.90] Intrauterine pregnancy: [redacted]w[redacted]d     Secondary diagnosis:  Principal Problem:   SVD (7/17) Active Problems:   Gestational hypertension w/o significant proteinuria in 3rd trimester   Normal postpartum course   H/O: depression                                Complications: None                                                              Intrapartum Procedures: spontaneous vaginal delivery Postpartum Procedures: none Complications-Operative and Postpartum: none Augmentation: AROM, Pitocin, and Cytotec   History of Present Illness: Ms. Jocelyn Waters is a 40 y.o. female, [id number], who presents at [redacted]w[redacted]d weeks gestation. The patient has been followed at  [facility]  Her pregnancy has been complicated by:  Patient Active Problem List   Diagnosis Date Noted   Normal postpartum course 02/19/2021   H/O: depression 02/19/2021   SVD (7/17) 02/18/2021   Gestational hypertension w/o significant proteinuria in 3rd trimester 02/16/2021   Anxiety disorder 06/15/2020   ADD (attention deficit disorder)      Active Ambulatory Problems    Diagnosis Date Noted   ADD (attention deficit disorder)    Anxiety disorder 06/15/2020   Resolved Ambulatory Problems    Diagnosis Date Noted   Reflux    Bipolar disorder (HCC)    ASCUS (atypical squamous cells of undetermined significance) on Pap smear    IUD (intrauterine  device) in place 12/23/2012   Pyelonephritis affecting pregnancy in second trimester, antepartum 08/19/2014   Preeclampsia 11/22/2014   Pregnancy induced hypertension, antepartum    [redacted] weeks gestation of pregnancy    Pregnancy 12/06/2014   Normal vaginal delivery 12/09/2014   Hyperemesis affecting pregnancy, antepartum 06/03/2016   HTN (hypertension) 12/03/2016   Appendicitis, acute 02/10/2020   Acute appendicitis 02/10/2020   Transient hypertension of pregnancy 06/15/2020   Allergic rhinitis 11/16/2020   Major depression single episode, in partial remission (HCC) 11/16/2020   Past Medical History:  Diagnosis Date   Anxiety    Depression    History of abnormal cervical Pap smear 2007   History of gestational hypertension    History of kidney stones 2016   History of pre-eclampsia    History of pregnancy induced hypertension    Hx of varicella    PONV (postoperative nausea and vomiting)    Pregnancy induced hypertension      Hospital course:  Induction of Labor With Vaginal Delivery   40 y.o. yo [phone] at [redacted]w[redacted]d was admitted to the hospital 02/16/2021 for induction of labor.  Indication for induction: Gestational hypertension.  Patient had  an uncomplicated labor course as follows: Membrane Rupture Time/Date: 8:19 PM ,02/17/2021   Delivery Method:Vaginal, Spontaneous  Episiotomy: None  Lacerations:  None  Details of delivery can be found in separate delivery note.  Patient had a routine postpartum course. Patient is discharged home 02/20/21.  Newborn Data: Birth date:02/18/2021  Birth time:1:22 AM  Gender:Female  Living status:Living  Apgars:8 ,9  Weight:3300 g  Postpartum Day # 2 : S/P NSVD due to pt was admitted on 7/15 for IOL for GHTN at 37 weeks, PCR 0.17, labs undetected, asymptomatic, no meds, h/o add anxiety, depression , mood stable denies hi/si on prozac 40mg  PO daily, pt progressed to SVD off cytotec, pitocin and AROM, over intact perineum, ebl was , hgb drop  of 10.9-10.5, asymptomatic, stable. Patient up ad lib, denies syncope or dizziness. Reports consuming regular diet without issues and denies N/V. Patient reports 0 bowel movement + passing flatus.  Denies issues with urination and reports bleeding is "lighter."  Patient is breast and bottle feeding and reports going well.  Desires depo at 6 weeks PP for postpartum contraception.  Pain is being appropriately managed with use of po meds.   Physical exam  Vitals:   02/19/21 0543 02/19/21 1740 02/19/21 2133 02/20/21 0530  BP: 127/68 129/77 134/69 125/76  Pulse: 61 68 75 72  Resp: 18 17 18 18   Temp: 97.8 F (36.6 C) 98.3 F (36.8 C) 97.8 F (36.6 C) 97.8 F (36.6 C)  TempSrc: Oral Oral Oral Oral  SpO2: 95% 97% 99% 97%  Weight:      Height:       General: alert, cooperative, and no distress Lochia: appropriate Uterine Fundus: firm Perineum: intact DVT Evaluation: No evidence of DVT seen on physical exam. Negative Homan's sign. No cords or calf tenderness. No significant calf/ankle edema.  Labs: Lab Results  Component Value Date   WBC 15.2 (H) 02/18/2021   HGB 10.5 (L) 02/18/2021   HCT 32.6 (L) 02/18/2021   MCV 83.8 02/18/2021   PLT 173 02/18/2021   CMP Latest Ref Rng & Units 02/16/2021  Glucose 70 - 99 mg/dL 86  BUN 6 - 20 mg/dL 7  Creatinine 02/20/2021 - 02/18/2021 mg/dL 3.24  Sodium 4.01 - 0.27 mmol/L 134(L)  Potassium 3.5 - 5.1 mmol/L 4.2  Chloride 98 - 111 mmol/L 104  CO2 22 - 32 mmol/L 21(L)  Calcium 8.9 - 10.3 mg/dL 253)  Total Protein 6.5 - 8.1 g/dL 5.3(L)  Total Bilirubin 0.3 - 1.2 mg/dL 0.3  Alkaline Phos 38 - 126 U/L 70  AST 15 - 41 U/L 17  ALT 0 - 44 U/L 12    Date of discharge: 02/20/2021 Discharge Diagnoses: Term Pregnancy-delivered and GHTN Discharge instruction: per After Visit Summary and "Baby and Me Booklet".  After visit meds:   Activity:           unrestricted and pelvic rest Advance as tolerated. Pelvic rest for 6 weeks.  Diet:                 routine Medications: PNV and Ibuprofen Postpartum contraception: Depo Provera Condition:  Pt discharge to home with baby in stable and condition  Meds: Allergies as of 02/20/2021       Reactions   Sulfamethoxazole-trimethoprim    Biaxin [clarithromycin] Rash   Septra [bactrim] Rash   Sulfa Antibiotics Rash        Medication List     STOP taking these medications    ondansetron 8 MG tablet Commonly  known as: ZOFRAN   promethazine 25 MG tablet Commonly known as: PHENERGAN       TAKE these medications    cetirizine 10 MG tablet Commonly known as: ZYRTEC Take 10 mg by mouth daily.   FeroSul 325 (65 FE) MG tablet Generic drug: ferrous sulfate Take 325 mg by mouth 2 (two) times daily.   FLUoxetine 40 MG capsule Commonly known as: PROZAC Take 40 mg by mouth daily.   ibuprofen 600 MG tablet Commonly known as: ADVIL Take 1 tablet (600 mg total) by mouth every 6 (six) hours.   ondansetron 8 MG disintegrating tablet Commonly known as: ZOFRAN-ODT Take by mouth.   pantoprazole 40 MG tablet Commonly known as: PROTONIX Take 1 tablet by mouth daily.        Discharge Follow Up:   Follow-up Information     [facility]. Schedule an appointment as soon as possible for a visit in 1 week(s).   Specialty: Obstetrics and Gynecology Why: 1-2 week mood check and 6 weeks PPV Contact information: [address] [place] [address] [phone]                 [place], NP-C, CNM 02/20/2021, 7:56 AM  Dale Vaughn, FNP

## 2021-03-01 ENCOUNTER — Telehealth (HOSPITAL_COMMUNITY): Payer: Self-pay | Admitting: *Deleted

## 2021-03-01 NOTE — Telephone Encounter (Signed)
Patient voiced no questions or concerns regarding her health. EPDS =4. Patient requested RN email information on postpartum support groups. Email sent. Patient reported infant sleeps in a bassinet in parents' room. Reports infant sleeps on her back. Patient voiced no questions or concerns regarding baby at this time. Deforest Hoyles, RN 03/01/21, [phone].

## 2021-05-25 ENCOUNTER — Other Ambulatory Visit (HOSPITAL_BASED_OUTPATIENT_CLINIC_OR_DEPARTMENT_OTHER): Payer: Self-pay

## 2021-05-25 ENCOUNTER — Other Ambulatory Visit: Payer: Self-pay

## 2021-05-25 ENCOUNTER — Ambulatory Visit: Payer: 59 | Attending: Internal Medicine

## 2021-05-25 DIAGNOSIS — Z23 Encounter for immunization: Secondary | ICD-10-CM

## 2021-05-25 MED ORDER — PFIZER COVID-19 VAC BIVALENT 30 MCG/0.3ML IM SUSP
INTRAMUSCULAR | 0 refills | Status: AC
  Filled 2021-05-25: qty 0.3, 1d supply, fill #0

## 2021-05-25 NOTE — Progress Notes (Signed)
   Covid-19 Vaccination Clinic  Name:  Jocelyn Waters    MRN: [id number] DOB: March 27, 1981  05/25/2021  Jocelyn Waters was observed post Covid-19 immunization for 15 minutes without incident. She was provided with Vaccine Information Sheet and instruction to access the V-Safe system.   Jocelyn Waters was instructed to call 911 with any severe reactions post vaccine: Difficulty breathing  Swelling of face and throat  A fast heartbeat  A bad rash all over body  Dizziness and weakness   Immunizations Administered     Name Date Dose VIS Date Route   Pfizer Covid-19 Vaccine Bivalent Booster 05/25/2021  3:14 PM 0.3 mL 04/04/2021 Intramuscular   Manufacturer: [employer], [employer]   Lot: [id number]   NDC: [phone]

## 2021-10-31 ENCOUNTER — Ambulatory Visit: Payer: 59 | Admitting: Adult Health

## 2021-10-31 ENCOUNTER — Other Ambulatory Visit: Payer: Self-pay

## 2021-10-31 ENCOUNTER — Telehealth: Payer: Self-pay | Admitting: Adult Health

## 2021-10-31 ENCOUNTER — Other Ambulatory Visit: Payer: Self-pay | Admitting: Adult Health

## 2021-10-31 ENCOUNTER — Encounter: Payer: Self-pay | Admitting: Adult Health

## 2021-10-31 DIAGNOSIS — F909 Attention-deficit hyperactivity disorder, unspecified type: Secondary | ICD-10-CM

## 2021-10-31 DIAGNOSIS — F411 Generalized anxiety disorder: Secondary | ICD-10-CM

## 2021-10-31 DIAGNOSIS — F4010 Social phobia, unspecified: Secondary | ICD-10-CM | POA: Diagnosis not present

## 2021-10-31 DIAGNOSIS — G4700 Insomnia, unspecified: Secondary | ICD-10-CM | POA: Diagnosis not present

## 2021-10-31 DIAGNOSIS — F410 Panic disorder [episodic paroxysmal anxiety] without agoraphobia: Secondary | ICD-10-CM

## 2021-10-31 MED ORDER — METHYLPHENIDATE HCL ER (OSM) 36 MG PO TBCR: 36.0000 mg | EXTENDED_RELEASE_TABLET | Freq: Every day | ORAL | 0 refills | Status: DC

## 2021-10-31 MED ORDER — TRAZODONE HCL 50 MG PO TABS: 50.0000 mg | ORAL_TABLET | Freq: Every day | ORAL | 5 refills | Status: DC

## 2021-10-31 NOTE — Telephone Encounter (Signed)
Patient notified

## 2021-10-31 NOTE — Telephone Encounter (Signed)
Pls advise Concerta 36mg  sent in.

## 2021-10-31 NOTE — Progress Notes (Signed)
Jocelyn Waters ?[id number] ?Apr 29, 1981 ?41 y.o. ? ?Subjective:  ? ?Patient ID:  Jocelyn Waters is a 41 y.o. (DOB 01/24/1981) female. ? ?Chief Complaint: No chief complaint on file. ? ? ?HPI ?Suhailah L Stirling presents to the office today for follow-up of GAD, SAD, panic attacks and ADD. ? ?Describes mood today as "ok". Pleasant. Mood symptoms - denies depression and irritability. Reports some anxiety. Reports some worry and rumination at night. Getting overwhelmed trying to get things done. Unable to restart Adderall for 3 to 4 more months. Stating "I'm doing ok, but I could feel better". Taking Prozac 40mg  daily. Has used Xanax occasionally for sleep. Varying interest and motivation. Taking medications as prescribed.  ?Energy levels stable considering everything she has to do. Active, does not have a regular exercise routine. ?Enjoys some usual interests and activities. Married. Lives with husband and 3 children. Sister local. Parents 2 hours away. Spending time with family. ?Appetite adequate. Weight gain - post pregnancy - 195 pounds. ?Sleeps well most nights. Averages 7 to 8 hours - up and down less during the night. ?Focus and concentration difficulties - unable to take Adderall while breast feeding. Not as productive off the mediation. Completing tasks. Managing aspects of household. Works for the . ?Denies SI or HI.  ?Denies AH or VH. ? ?Previous medication trials: Xanax, Adderall XR, Lorazepam,  ? ? ? ?Flowsheet Row Admission (Discharged) from 02/16/2021 in [place] 5S Mother Baby Unit Admission (Discharged) from 01/17/2021 in [facility] 1S Maternity Assessment Unit Admission (Discharged) from 11/16/2020 in [facility] 1S Maternity Assessment Unit  ?C-SSRS RISK CATEGORY No Risk No Risk No Risk  ? ?  ?  ? ?Review of Systems:  ?Review of Systems  ?Musculoskeletal:  Negative for gait problem.  ?Neurological:  Negative for tremors.  ?Psychiatric/Behavioral:    ?     Please refer to HPI  ? ?Medications: I have reviewed  the patient's current medications. ? ?Current Outpatient Medications  ?Medication Sig Dispense Refill  ? traZODone (DESYREL) 50 MG tablet Take 1 tablet (50 mg total) by mouth at bedtime. 30 tablet 5  ? cetirizine (ZYRTEC) 10 MG tablet Take 10 mg by mouth daily.    ? COVID-19 mRNA bivalent vaccine, Pfizer, (PFIZER COVID-19 VAC BIVALENT) injection Inject into the muscle. 0.3 mL 0  ? FEROSUL 325 (65 Fe) MG tablet Take 325 mg by mouth 2 (two) times daily.    ? FLUoxetine (PROZAC) 40 MG capsule Take 40 mg by mouth daily.    ? ibuprofen (ADVIL) 600 MG tablet Take 1 tablet (600 mg total) by mouth every 6 (six) hours. 30 tablet 0  ? ondansetron (ZOFRAN-ODT) 8 MG disintegrating tablet Take by mouth.    ? pantoprazole (PROTONIX) 40 MG tablet Take 1 tablet by mouth daily.    ? ?No current facility-administered medications for this visit.  ? ? ?Medication Side Effects: None ? ?Allergies:  ?Allergies  ?Allergen Reactions  ? Sulfamethoxazole-Trimethoprim   ?  Other reaction(s): rash  ? Biaxin [Clarithromycin] Rash  ? Septra [Bactrim] Rash  ? Sulfa Antibiotics Rash and Other (See Comments)  ? ? ?Past Medical History:  ?Diagnosis Date  ? ADD (attention deficit disorder)   ? Anxiety   ? Depression   ? History of abnormal cervical Pap smear 2007  ? History of gestational hypertension   ? History of kidney stones 2016  ? History of pre-eclampsia   ? History of pregnancy induced hypertension   ? Hx of varicella   ?  PONV (postoperative nausea and vomiting)   ? Pregnancy induced hypertension   ? ? ?Past Medical History, Surgical history, Social history, and Family history were reviewed and updated as appropriate.  ? ?Please see review of systems for further details on the patient's review from today.  ? ?Objective:  ? ?Physical Exam:  ?There were no vitals taken for this visit. ? ?Physical Exam ?Constitutional:   ?   General: She is not in acute distress. ?Musculoskeletal:     ?   General: No deformity.  ?Neurological:  ?   Mental  Status: She is alert and oriented to person, place, and time.  ?   Coordination: Coordination normal.  ?Psychiatric:     ?   Attention and Perception: Attention and perception normal. She does not perceive auditory or visual hallucinations.     ?   Mood and Affect: Mood normal. Mood is not anxious or depressed. Affect is not labile, blunt, angry or inappropriate.     ?   Speech: Speech normal.     ?   Behavior: Behavior normal.     ?   Thought Content: Thought content normal. Thought content is not paranoid or delusional. Thought content does not include homicidal or suicidal ideation. Thought content does not include homicidal or suicidal plan.     ?   Cognition and Memory: Cognition and memory normal.     ?   Judgment: Judgment normal.  ?   Comments: Insight intact  ? ? ?Lab Review:  ?   ?Component Value Date/Time  ? NA 134 (L) 02/16/2021 1748  ? K 4.2 02/16/2021 1748  ? CL 104 02/16/2021 1748  ? CO2 21 (L) 02/16/2021 1748  ? GLUCOSE 86 02/16/2021 1748  ? BUN 7 02/16/2021 1748  ? CREATININE 0.60 02/16/2021 1748  ? CALCIUM 8.7 (L) 02/16/2021 1748  ? PROT 5.3 (L) 02/16/2021 1748  ? ALBUMIN 2.5 (L) 02/16/2021 1748  ? AST 17 02/16/2021 1748  ? ALT 12 02/16/2021 1748  ? ALKPHOS 70 02/16/2021 1748  ? BILITOT 0.3 02/16/2021 1748  ? GFRNONAA >60 02/16/2021 1748  ? GFRAA >60 02/10/2020 1222  ? ? ?   ?Component Value Date/Time  ? WBC 15.2 (H) 02/18/2021 0511  ? RBC 3.89 02/18/2021 0511  ? HGB 10.5 (L) 02/18/2021 0511  ? HCT 32.6 (L) 02/18/2021 0511  ? PLT 173 02/18/2021 0511  ? MCV 83.8 02/18/2021 0511  ? MCH 27.0 02/18/2021 0511  ? MCHC 32.2 02/18/2021 0511  ? RDW 17.5 (H) 02/18/2021 0511  ? LYMPHSABS 1.4 07/01/2016 1614  ? MONOABS 0.4 07/01/2016 1614  ? EOSABS 0.0 07/01/2016 1614  ? BASOSABS 0.0 07/01/2016 1614  ? ? ?No results found for: POCLITH, LITHIUM  ? ?No results found for: PHENYTOIN, PHENOBARB, VALPROATE, CBMZ  ? ?.res ?Assessment: Plan:   ? ?Plan: ? ?PDMP reviewed ? ?1. Continue Prozac 40mg  daily ?2. D/C Xanax  0.5mg  BID prn anxiety/panic attacks ?3. D/C Adderall XR 20mg  ?4. Trazadone 50mg  at bedtime ? ?Consider Ritalin for ADHD symptoms - will discuss with OB ? ?RTC 4 weeks ? ?Patient advised to contact office with any questions, adverse effects, or acute worsening in signs and symptoms. ? ? ?Diagnoses and all orders for this visit: ? ?Insomnia, unspecified type ?-     traZODone (DESYREL) 50 MG tablet; Take 1 tablet (50 mg total) by mouth at bedtime. ? ?Generalized anxiety disorder ? ?Attention deficit hyperactivity disorder (ADHD), unspecified ADHD type ? ?Social anxiety disorder ? ?Panic attacks ? ?  ? ?  Please see After Visit Summary for patient specific instructions. ? ?No future appointments. ? ?No orders of the defined types were placed in this encounter. ? ? ?------------------------------- ?

## 2021-10-31 NOTE — Telephone Encounter (Signed)
Kirstan called today at 3:45pm to follow up with you as requested.  She spoke with her OB-GYN, Dr. Steele Berg.  The Dr. Catalina Pizza her that it is ok to take Ritalin while breast feeding. If you decide to prescribe she uses the [facility] on [address] ?

## 2021-11-26 ENCOUNTER — Other Ambulatory Visit: Payer: Self-pay | Admitting: Adult Health

## 2021-11-26 ENCOUNTER — Telehealth: Payer: Self-pay | Admitting: Adult Health

## 2021-11-26 DIAGNOSIS — F909 Attention-deficit hyperactivity disorder, unspecified type: Secondary | ICD-10-CM

## 2021-11-26 MED ORDER — METHYLPHENIDATE HCL ER (OSM) 36 MG PO TBCR: 36.0000 mg | EXTENDED_RELEASE_TABLET | Freq: Every day | ORAL | 0 refills | Status: DC

## 2021-11-26 NOTE — Telephone Encounter (Signed)
Pt called at 10:28 am and asked for a refill on her concerta 36 mg . [facility] [address] on [address]. They do have it in stock ?

## 2021-11-26 NOTE — Telephone Encounter (Signed)
Script sent  

## 2021-12-07 IMAGING — US US ABDOMEN COMPLETE
1 series · 14 of 25 positions shown · non-contrast
Comparison: None.

CLINICAL DATA: Right upper quadrant pain

EXAM:
ABDOMEN ULTRASOUND COMPLETE

[Series 1: us abdomen complete · 0.15mm/px · 14 of 88 slices shown]
[im 1/88]
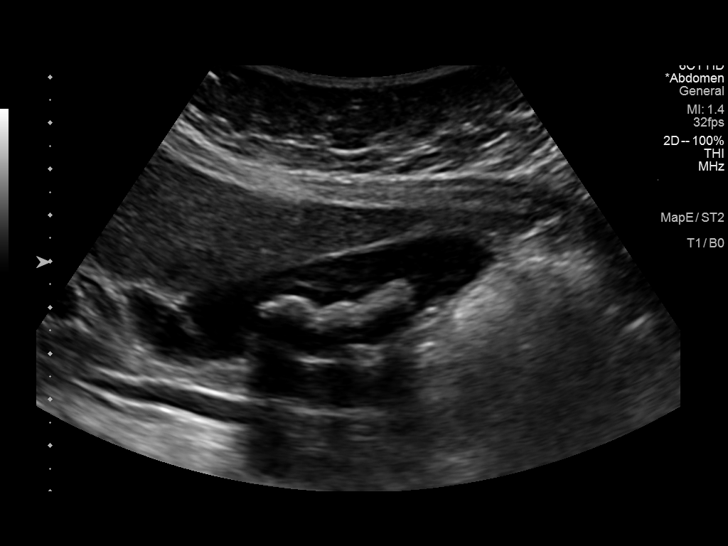
[im 8/88]
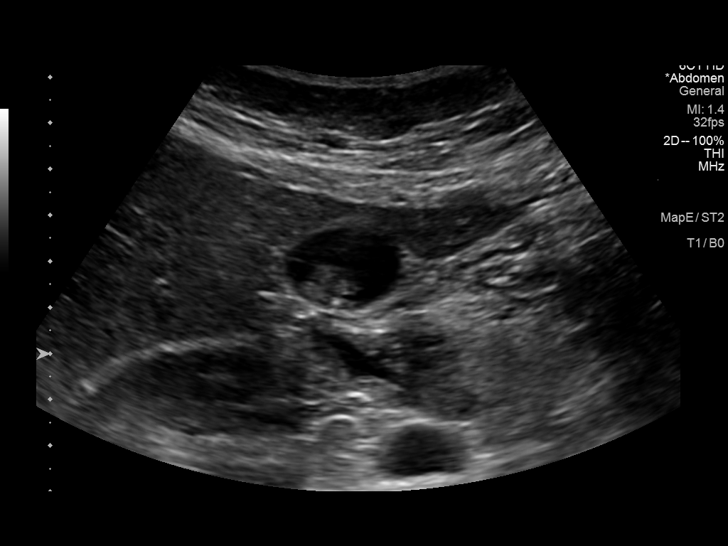
[im 15/88]
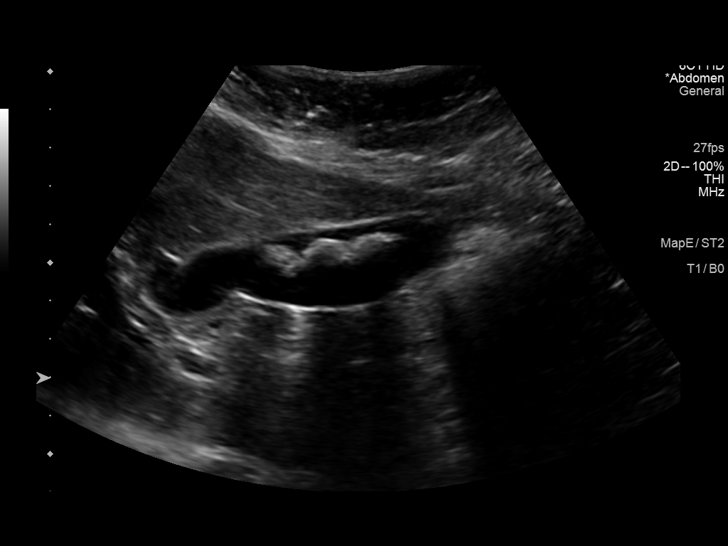
[im 22/88]
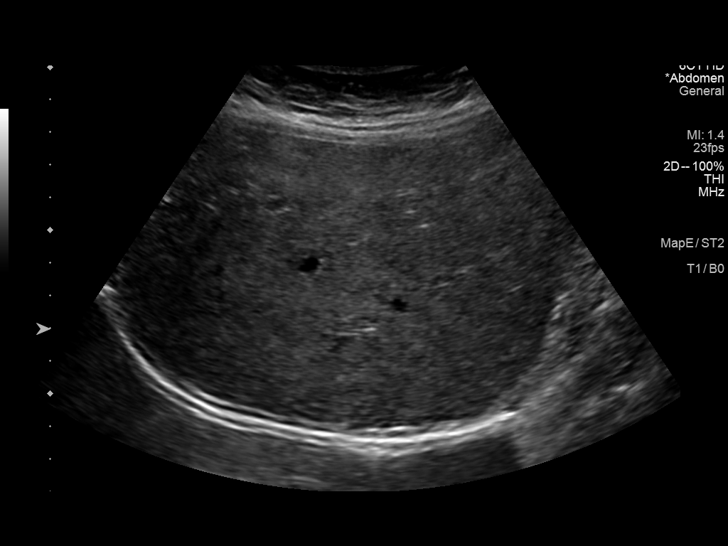
[im 30/88]
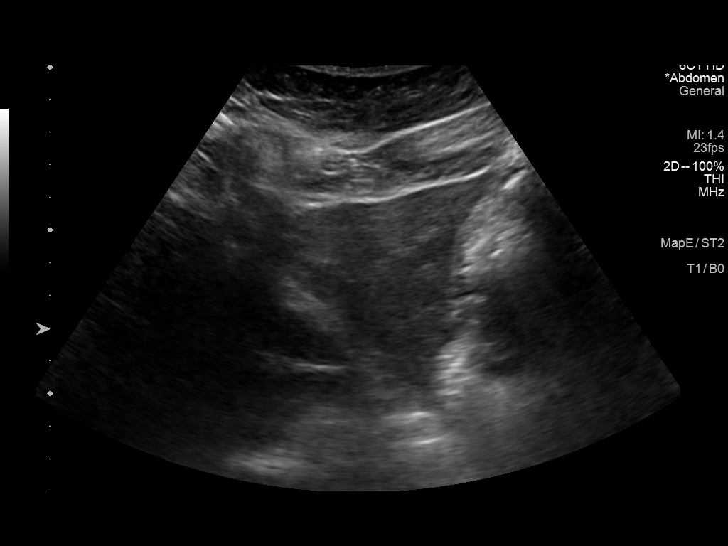
[im 33/88]
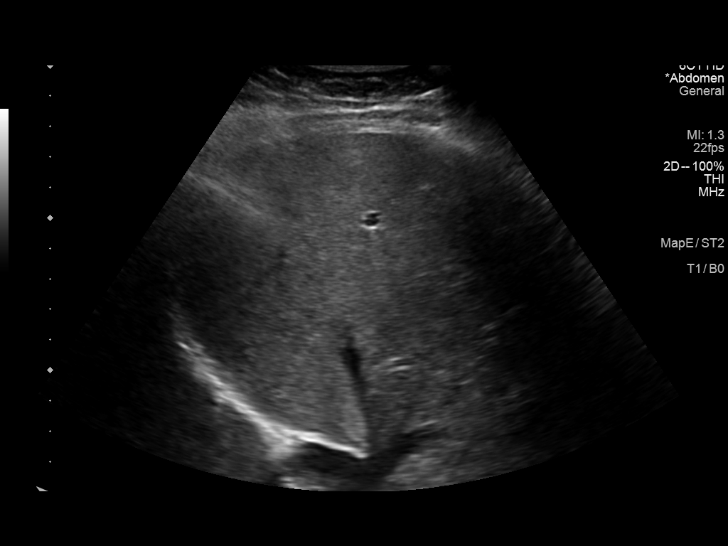
[im 40/88]
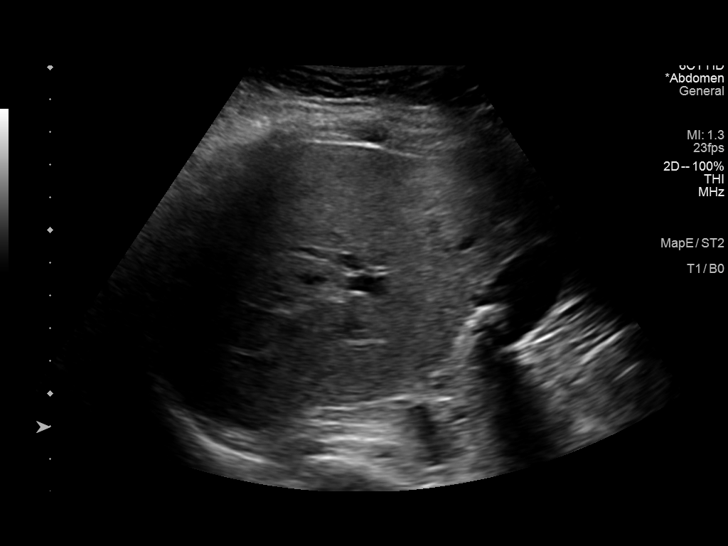
[im 48/88]
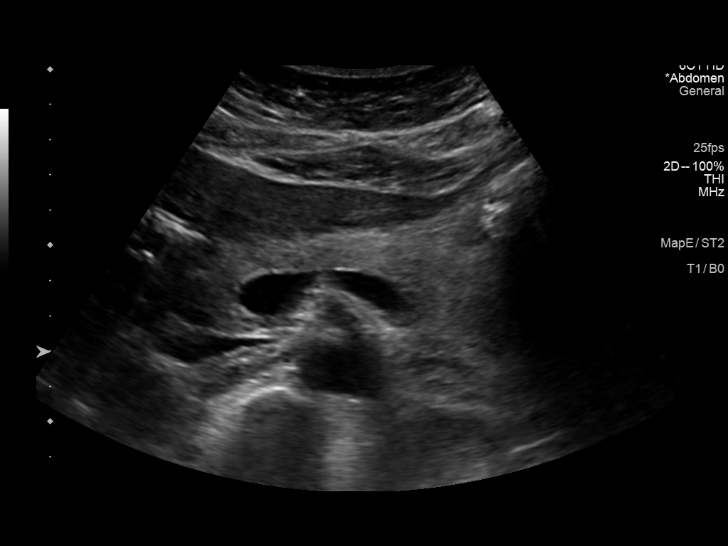
[im 55/88]
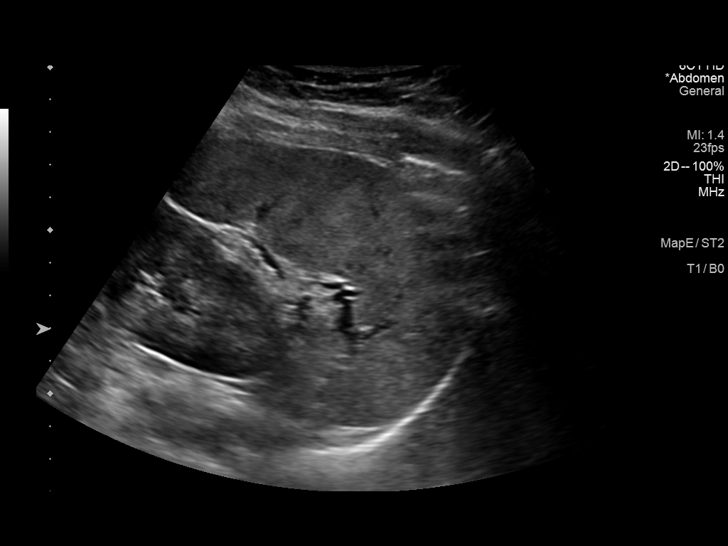
[im 59/88]
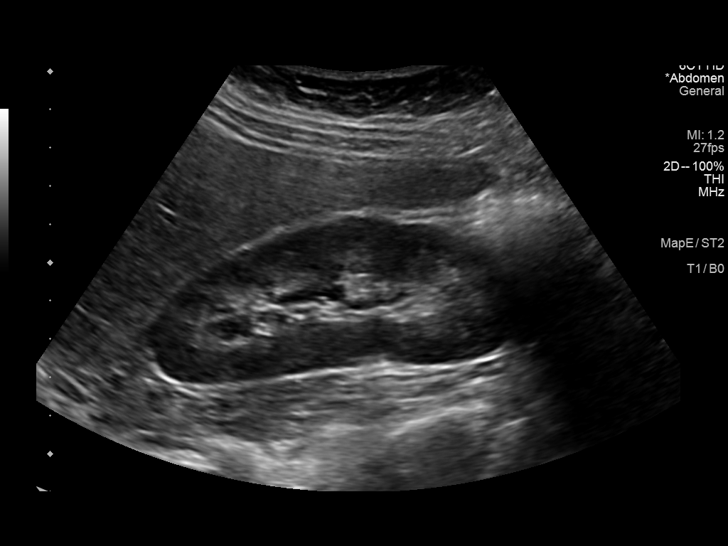
[im 66/88]
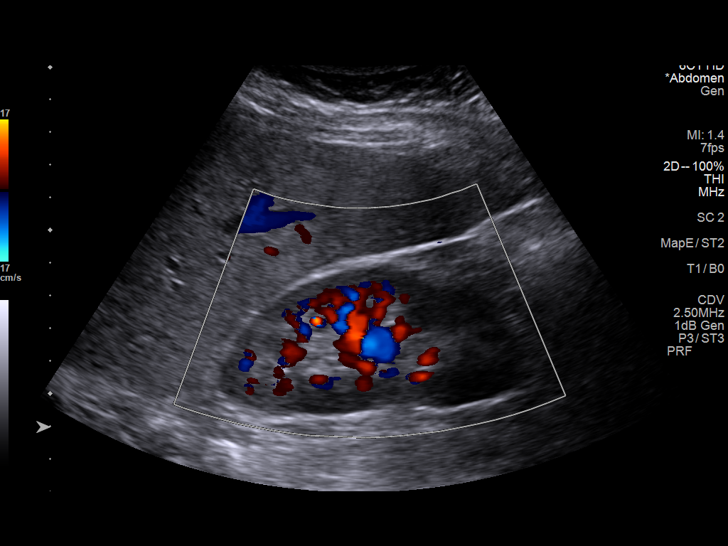
[im 73/88]
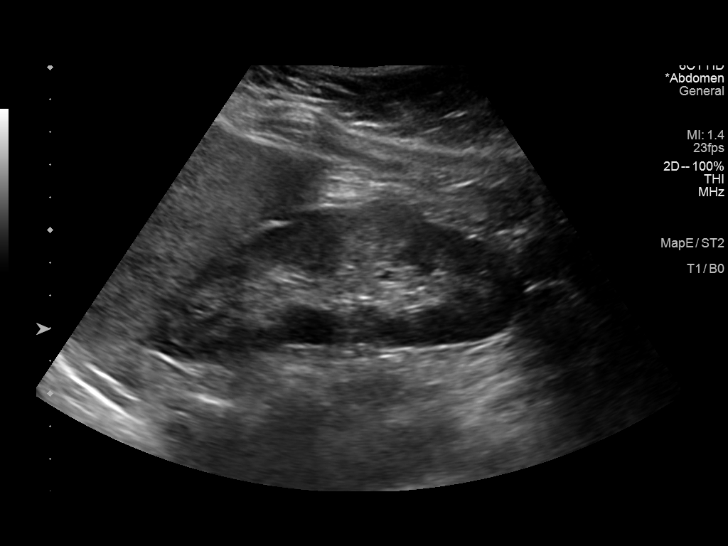
[im 80/88]
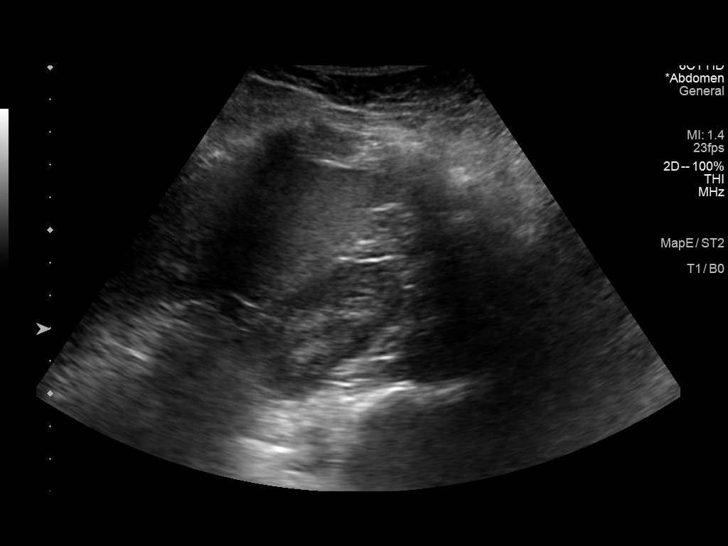
[im 88/88]
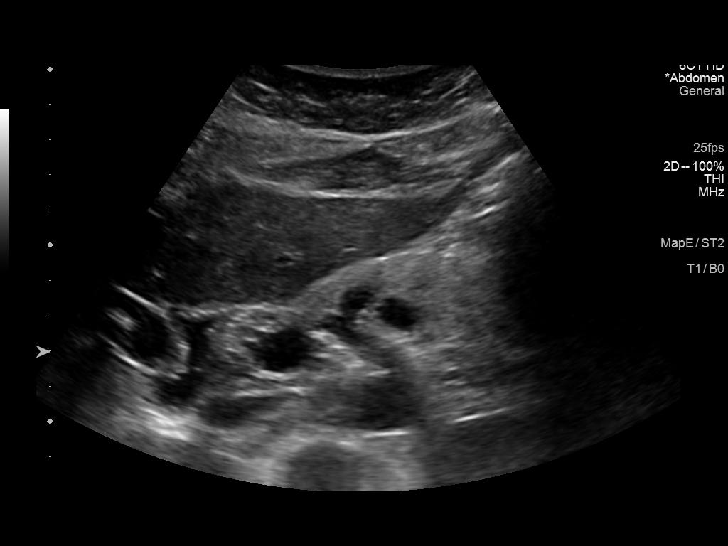

[14 of 25 positions shown; findings below may reference images not displayed]

FINDINGS: Gallbladder: Multiple gallstones measuring up to 12 mm. Sludge is
also present. No wall thickening visualized. No sonographic Murphy
sign noted by sonographer.

Common bile duct: Diameter: 3.6 mm, normal

Liver: No focal lesion identified. Within normal limits in
parenchymal echogenicity. Portal vein is patent on color Doppler
imaging with normal direction of blood flow towards the liver.

IVC: No abnormality visualized.

Pancreas: Visualized portion unremarkable.

Spleen: Size and appearance within normal limits.

Right Kidney: Length: 10.3 cm. Echogenicity within normal limits. No
mass or hydronephrosis visualized.

Left Kidney: Length: 12.3 cm. Echogenicity within normal limits. No
mass or hydronephrosis visualized.

Abdominal aorta: No aneurysm visualized.

Other findings: None.
IMPRESSION: Cholelithiasis and sludge. No sonographic evidence of acute
cholecystitis.

## 2022-01-01 ENCOUNTER — Telehealth: Payer: Self-pay | Admitting: Adult Health

## 2022-01-01 ENCOUNTER — Other Ambulatory Visit: Payer: Self-pay

## 2022-01-01 DIAGNOSIS — F909 Attention-deficit hyperactivity disorder, unspecified type: Secondary | ICD-10-CM

## 2022-01-01 MED ORDER — METHYLPHENIDATE HCL ER (OSM) 36 MG PO TBCR: 36.0000 mg | EXTENDED_RELEASE_TABLET | Freq: Every day | ORAL | 0 refills | Status: DC

## 2022-01-01 NOTE — Telephone Encounter (Signed)
Patient called in for refill for Concerta 36mg . States that she has called [facility] and they have in stock and she would like prescription sent there. Ph: [phone] Appt 6/29 [facility] [address]

## 2022-01-01 NOTE — Telephone Encounter (Signed)
Pended.

## 2022-01-09 ENCOUNTER — Telehealth: Payer: Self-pay

## 2022-01-31 ENCOUNTER — Ambulatory Visit: Payer: 59 | Admitting: Adult Health

## 2022-01-31 ENCOUNTER — Encounter: Payer: Self-pay | Admitting: Adult Health

## 2022-01-31 DIAGNOSIS — F909 Attention-deficit hyperactivity disorder, unspecified type: Secondary | ICD-10-CM | POA: Diagnosis not present

## 2022-01-31 DIAGNOSIS — F411 Generalized anxiety disorder: Secondary | ICD-10-CM

## 2022-01-31 DIAGNOSIS — G4700 Insomnia, unspecified: Secondary | ICD-10-CM

## 2022-01-31 DIAGNOSIS — F410 Panic disorder [episodic paroxysmal anxiety] without agoraphobia: Secondary | ICD-10-CM

## 2022-01-31 MED ORDER — AMPHETAMINE-DEXTROAMPHET ER 20 MG PO CP24: 20.0000 mg | ORAL_CAPSULE | Freq: Every day | ORAL | 0 refills | Status: DC

## 2022-01-31 MED ORDER — FLUOXETINE HCL 40 MG PO CAPS: 40.0000 mg | ORAL_CAPSULE | Freq: Every day | ORAL | 5 refills | Status: DC

## 2022-01-31 MED ORDER — TRAZODONE HCL 50 MG PO TABS: 50.0000 mg | ORAL_TABLET | Freq: Every day | ORAL | 5 refills | Status: DC

## 2022-01-31 NOTE — Progress Notes (Signed)
Jocelyn Waters [id number] Mar 01, 1981 41 y.o.  Subjective:   Patient ID:  Jocelyn Waters is a 41 y.o. (DOB 1981/04/04) female.  Chief Complaint: No chief complaint on file.   HPI Jorge L Casados presents to the office today for follow-up of GAD, SAD, panic attacks and ADD.  Describes mood today as "ok". Pleasant. Mood symptoms - denies depression and irritability. Reports periods of anxiety - when overwhelmed. Denies recent panic attacks. Decreased worry and rumination. Decreased overthinking and over processing. Stating "I'm doing much better". Does not feel like the Ritalin is as helpful as the Adderall and would like to switch back to Adderall now that she is no longer breastfeeding. Improved interest and motivation. Taking medications as prescribed.  Energy levels stable. Active, does not have a regular exercise routine. Enjoys some usual interests and activities. Married. Lives with husband and 3 children. Sister local. Parents 2 hours away. Spending time with family. Appetite adequate. Weight loss - 190 pounds. Sleeps well most nights. Averages 7 to 8 hours. Focus and concentration improved. Completing tasks. Managing aspects of household. Works for the [employer]. Denies SI or HI.  Denies AH or VH. Denies self harm. Denies substance use.  Previous medication trials: Xanax, Adderall XR, Lorazepam,    Flowsheet Row Admission (Discharged) from 02/16/2021 in [place] 5S Mother Baby Unit Admission (Discharged) from 01/17/2021 in [facility] 1S Maternity Assessment Unit Admission (Discharged) from 11/16/2020 in [facility] 1S Maternity Assessment Unit  C-SSRS RISK CATEGORY No Risk No Risk No Risk        Review of Systems:  Review of Systems  Musculoskeletal:  Negative for gait problem.  Neurological:  Negative for tremors.  Psychiatric/Behavioral:         Please refer to HPI    Medications: I have reviewed the patient's current medications.  Current Outpatient Medications  Medication Sig  Dispense Refill   cetirizine (ZYRTEC) 10 MG tablet Take 10 mg by mouth daily.     COVID-19 mRNA bivalent vaccine, Pfizer, (PFIZER COVID-19 VAC BIVALENT) injection Inject into the muscle. 0.3 mL 0   FEROSUL 325 (65 Fe) MG tablet Take 325 mg by mouth 2 (two) times daily.     FLUoxetine (PROZAC) 40 MG capsule Take 40 mg by mouth daily.     ibuprofen (ADVIL) 600 MG tablet Take 1 tablet (600 mg total) by mouth every 6 (six) hours. 30 tablet 0   methylphenidate (CONCERTA) 36 MG PO CR tablet Take 1 tablet (36 mg total) by mouth daily. 30 tablet 0   ondansetron (ZOFRAN-ODT) 8 MG disintegrating tablet Take by mouth.     pantoprazole (PROTONIX) 40 MG tablet Take 1 tablet by mouth daily.     traZODone (DESYREL) 50 MG tablet Take 1 tablet (50 mg total) by mouth at bedtime. 30 tablet 5   No current facility-administered medications for this visit.    Medication Side Effects: None  Allergies:  Allergies  Allergen Reactions   Sulfamethoxazole-Trimethoprim     Other reaction(s): rash   Biaxin [Clarithromycin] Rash   Septra [Bactrim] Rash   Sulfa Antibiotics Rash and Other (See Comments)    Past Medical History:  Diagnosis Date   ADD (attention deficit disorder)    Anxiety    Depression    History of abnormal cervical Pap smear 2007   History of gestational hypertension    History of kidney stones 2016   History of pre-eclampsia    History of pregnancy induced hypertension    Hx of varicella  PONV (postoperative nausea and vomiting)    Pregnancy induced hypertension     Past Medical History, Surgical history, Social history, and Family history were reviewed and updated as appropriate.   Please see review of systems for further details on the patient's review from today.   Objective:   Physical Exam:  There were no vitals taken for this visit.  Physical Exam Constitutional:      General: She is not in acute distress. Musculoskeletal:        General: No deformity.  Neurological:      Mental Status: She is alert and oriented to person, place, and time.     Coordination: Coordination normal.  Psychiatric:        Attention and Perception: Attention and perception normal. She does not perceive auditory or visual hallucinations.        Mood and Affect: Mood normal. Mood is not anxious or depressed. Affect is not labile, blunt, angry or inappropriate.        Speech: Speech normal.        Behavior: Behavior normal.        Thought Content: Thought content normal. Thought content is not paranoid or delusional. Thought content does not include homicidal or suicidal ideation. Thought content does not include homicidal or suicidal plan.        Cognition and Memory: Cognition and memory normal.        Judgment: Judgment normal.     Comments: Insight intact     Lab Review:     Component Value Date/Time   NA 134 (L) 02/16/2021 1748   K 4.2 02/16/2021 1748   CL 104 02/16/2021 1748   CO2 21 (L) 02/16/2021 1748   GLUCOSE 86 02/16/2021 1748   BUN 7 02/16/2021 1748   CREATININE 0.60 02/16/2021 1748   CALCIUM 8.7 (L) 02/16/2021 1748   PROT 5.3 (L) 02/16/2021 1748   ALBUMIN 2.5 (L) 02/16/2021 1748   AST 17 02/16/2021 1748   ALT 12 02/16/2021 1748   ALKPHOS 70 02/16/2021 1748   BILITOT 0.3 02/16/2021 1748   GFRNONAA >60 02/16/2021 1748   GFRAA >60 02/10/2020 1222       Component Value Date/Time   WBC 15.2 (H) 02/18/2021 0511   RBC 3.89 02/18/2021 0511   HGB 10.5 (L) 02/18/2021 0511   HCT 32.6 (L) 02/18/2021 0511   PLT 173 02/18/2021 0511   MCV 83.8 02/18/2021 0511   MCH 27.0 02/18/2021 0511   MCHC 32.2 02/18/2021 0511   RDW 17.5 (H) 02/18/2021 0511   LYMPHSABS 1.4 07/01/2016 1614   MONOABS 0.4 07/01/2016 1614   EOSABS 0.0 07/01/2016 1614   BASOSABS 0.0 07/01/2016 1614    No results found for: "POCLITH", "LITHIUM"   No results found for: "PHENYTOIN", "PHENOBARB", "VALPROATE", "CBMZ"   .res Assessment: Plan:    Plan:  PDMP reviewed  Prozac 40mg   daily Adderall XR 20mg  Trazadone 50mg  at bedtime  RTC 4 weeks  Patient advised to contact office with any questions, adverse effects, or acute worsening in signs and symptoms.  There are no diagnoses linked to this encounter.   Please see After Visit Summary for patient specific instructions.  No future appointments.  No orders of the defined types were placed in this encounter.   -------------------------------

## 2022-02-28 IMAGING — CT CT ABD-PELV W/ CM
2 of 4 series · 16 of 46 positions shown, 18 images · IV contrast (omnipaque)
Comparison: Ultrasound 11/19/2019

CLINICAL DATA: Right lower quadrant pain

EXAM:
CT ABDOMEN AND PELVIS WITH CONTRAST
TECHNIQUE: Multidetector CT imaging of the abdomen and pelvis was performed
using the standard protocol following bolus administration of
intravenous contrast.
CONTRAST:  100mL OMNIPAQUE IOHEXOL 300 MG/ML  SOLN

[Series 2: axial st · axial · 0.74mm/px · z∈[-522,-132]mm · 13 of 90 slices shown, 15 images]
[im 6/90  soft-tissue]
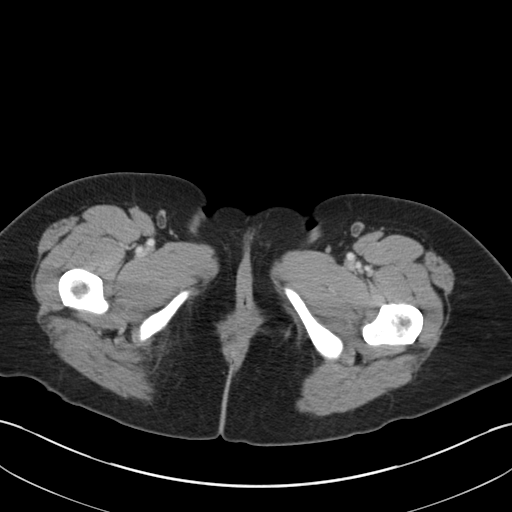
[im 6/90  bone]
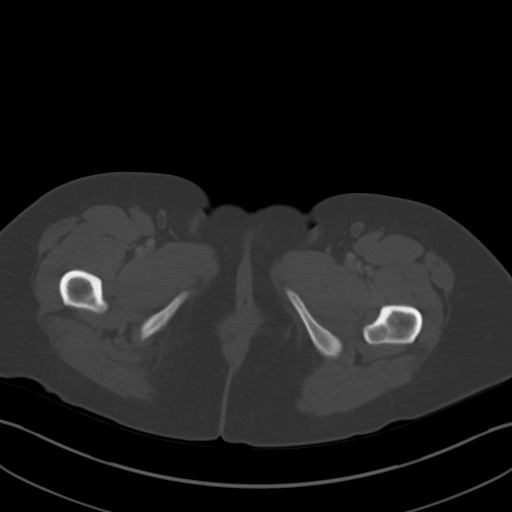
[im 11/90  soft-tissue]
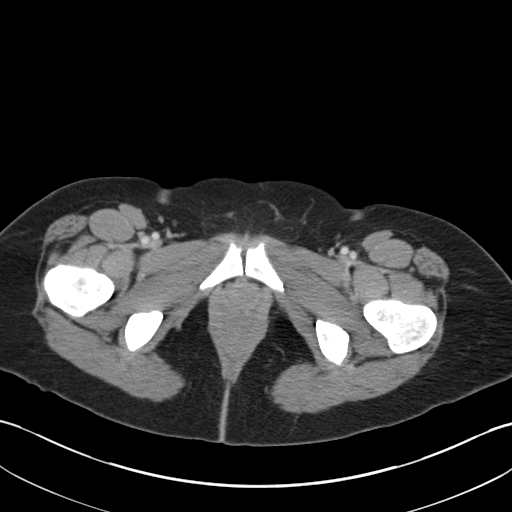
[im 21/90  soft-tissue]
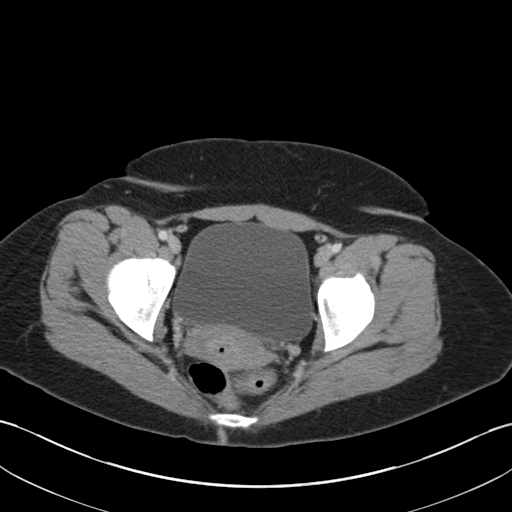
[im 27/90  soft-tissue]
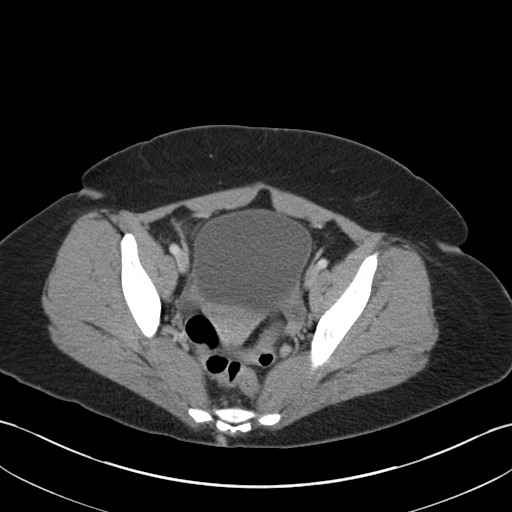
[im 32/90  soft-tissue]
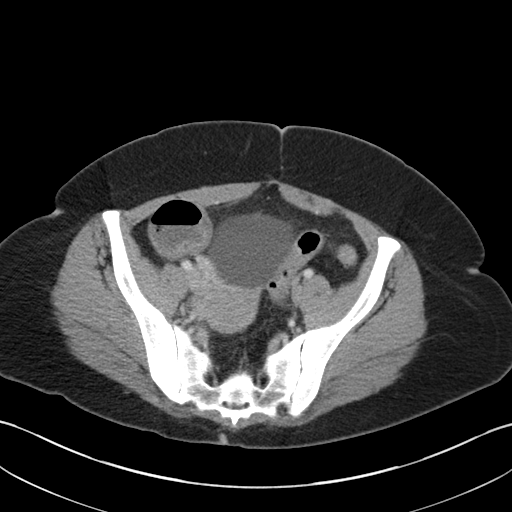
[im 37/90  soft-tissue]
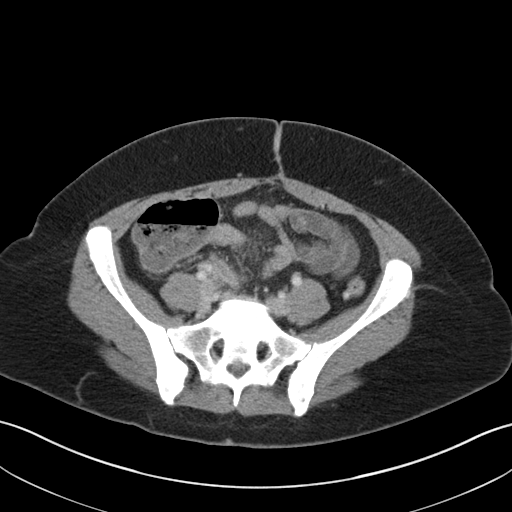
[im 48/90  soft-tissue]
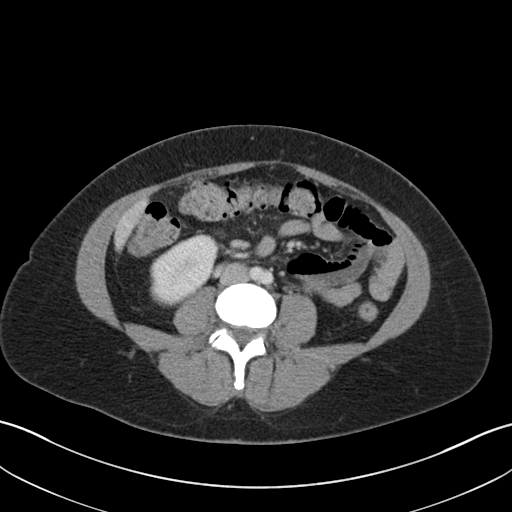
[im 53/90  soft-tissue]
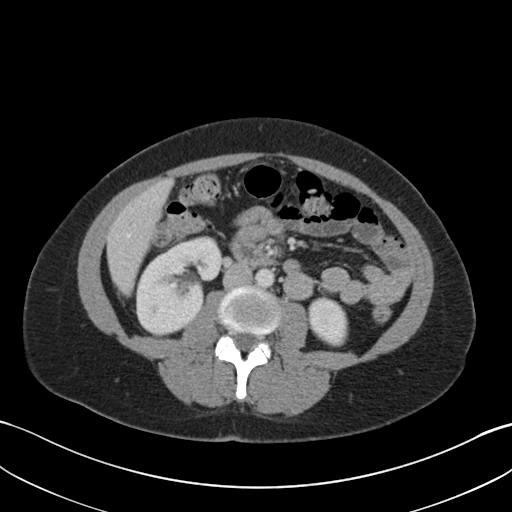
[im 58/90  soft-tissue]
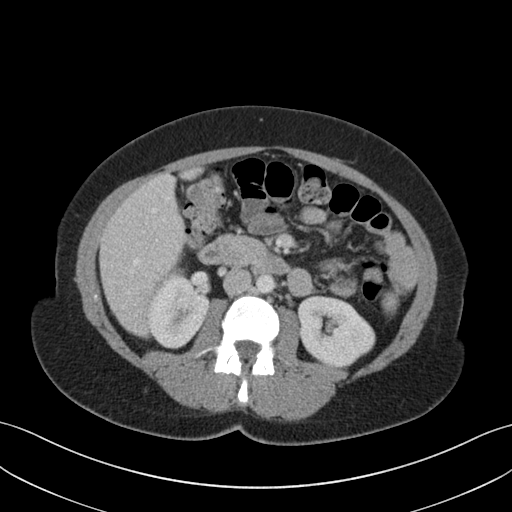
[im 58/90  bone]
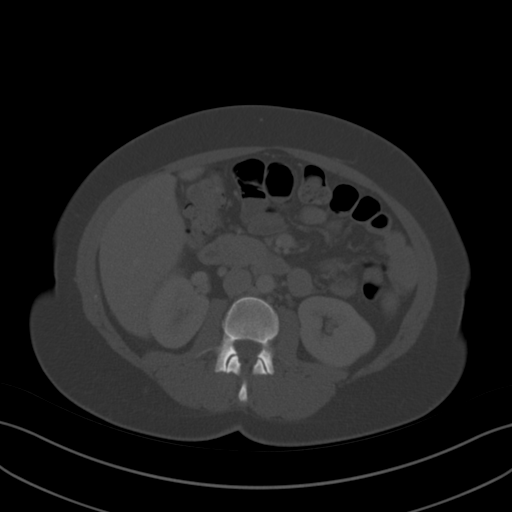
[im 63/90  soft-tissue]
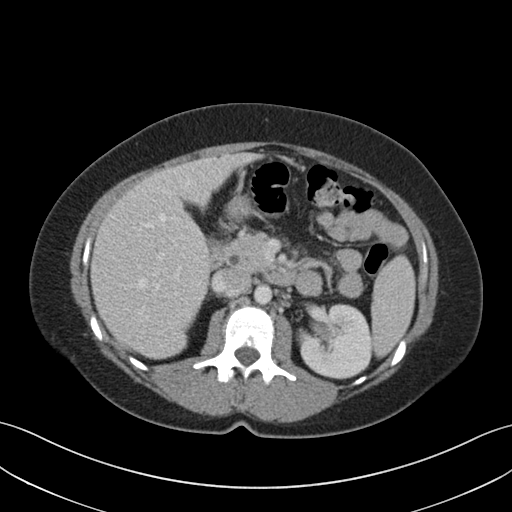
[im 69/90  soft-tissue]
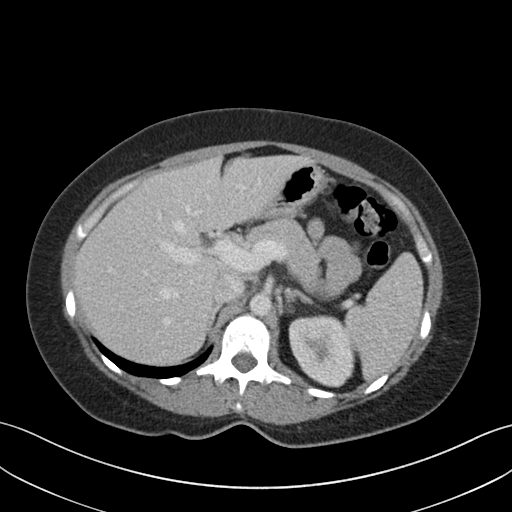
[im 79/90  soft-tissue]
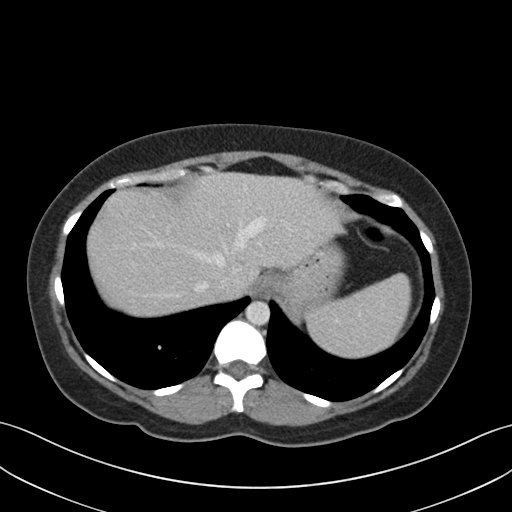
[im 84/90  soft-tissue]
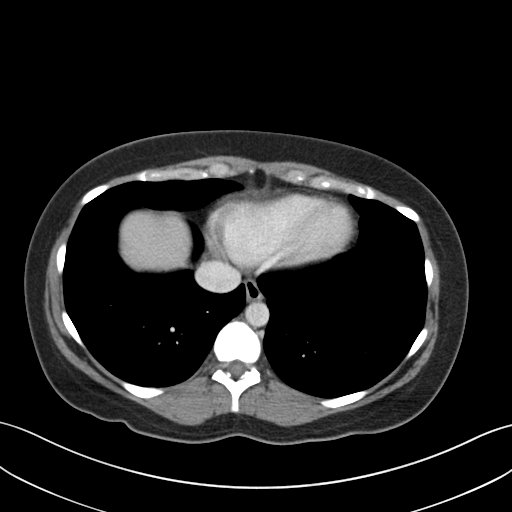

[Series 5: coronal st · coronal · 0.71mm/px · 3 of 142 slices shown]
[im 48/142  soft-tissue]
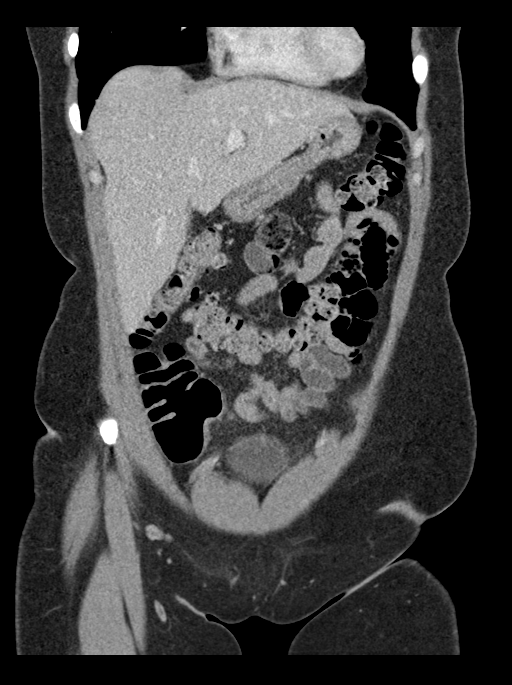
[im 63/142  soft-tissue]
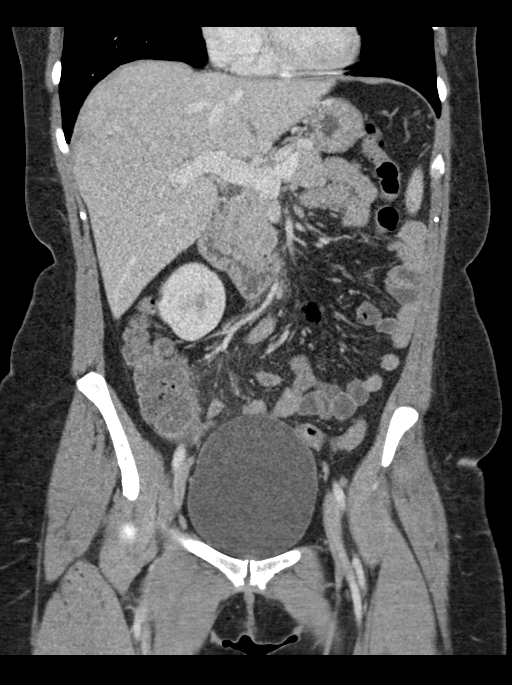
[im 79/142  soft-tissue]
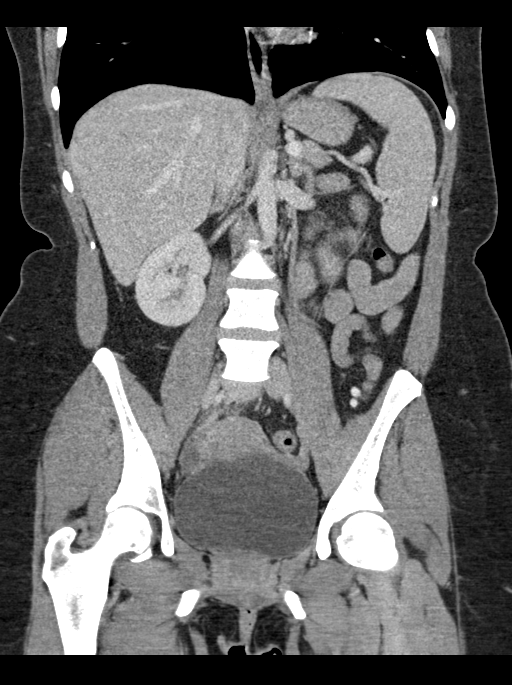

[16 of 46 positions shown; findings below may reference images not displayed]

FINDINGS: Lower chest: Lung bases demonstrate no acute consolidation or
effusion. Heart size is normal.

Hepatobiliary: No focal liver abnormality is seen. Status post
cholecystectomy. No biliary dilatation.

Pancreas: Unremarkable. No pancreatic ductal dilatation or
surrounding inflammatory changes.

Spleen: Normal in size without focal abnormality.

Adrenals/Urinary Tract: Adrenal glands are normal. No
hydronephrosis. 2-3 mm stone lower pole right kidney. At least 2
stones in the mid to upper left kidney measuring up to 5 mm in size.
Urinary bladder is unremarkable.

Stomach/Bowel: Stomach is nonenlarged. No dilated small bowel.
Appendix slightly enlarged measuring up to 9 mm. No intraluminal
stones. The tip is of normal caliber. Mild fat stranding in the fat
surrounding the slightly dilated segment of the appendix. No
extraluminal gas.

Vascular/Lymphatic: No significant vascular findings are present. No
enlarged abdominal or pelvic lymph nodes.

Reproductive: Uterus and bilateral adnexa are unremarkable.

Other: No abdominal wall hernia or abnormality. No abdominopelvic
ascites.

Musculoskeletal: No acute or significant osseous findings.
IMPRESSION: 1. Slightly enlarged appendix measuring up to 9 mm with mild fat
stranding/inflammation in the fat surrounding the slightly dilated
segment of the appendix. Findings could be secondary to early or
mild appendicitis. No perforation or abscess.

Appendix: Location: Right lower quadrant

Diameter: 9 mm

Appendicolith: Negative

Mucosal hyper-enhancement: Negative

Extraluminal gas: Negative

Periappendiceal collection: Negative

2.      Bilateral nonobstructing kidney stones.

## 2022-03-14 ENCOUNTER — Other Ambulatory Visit: Payer: Self-pay | Admitting: Adult Health

## 2022-03-14 DIAGNOSIS — F411 Generalized anxiety disorder: Secondary | ICD-10-CM

## 2022-03-14 DIAGNOSIS — F410 Panic disorder [episodic paroxysmal anxiety] without agoraphobia: Secondary | ICD-10-CM

## 2022-03-14 DIAGNOSIS — G4700 Insomnia, unspecified: Secondary | ICD-10-CM

## 2022-04-10 ENCOUNTER — Other Ambulatory Visit: Payer: Self-pay | Admitting: Family Medicine

## 2022-04-10 DIAGNOSIS — R1011 Right upper quadrant pain: Secondary | ICD-10-CM

## 2022-04-19 ENCOUNTER — Other Ambulatory Visit: Payer: 59

## 2022-04-26 ENCOUNTER — Ambulatory Visit
Admission: RE | Admit: 2022-04-26 | Discharge: 2022-04-26 | Disposition: A | Discharge status: Home / Self Care | Payer: 59 | Source: Ambulatory Visit | Attending: Family Medicine | Admitting: Family Medicine

## 2022-04-26 DIAGNOSIS — R1011 Right upper quadrant pain: Secondary | ICD-10-CM

## 2022-05-02 ENCOUNTER — Ambulatory Visit: Payer: 59 | Admitting: Adult Health

## 2022-05-02 ENCOUNTER — Encounter: Payer: Self-pay | Admitting: Adult Health

## 2022-05-02 DIAGNOSIS — F410 Panic disorder [episodic paroxysmal anxiety] without agoraphobia: Secondary | ICD-10-CM

## 2022-05-02 DIAGNOSIS — F411 Generalized anxiety disorder: Secondary | ICD-10-CM

## 2022-05-02 DIAGNOSIS — F909 Attention-deficit hyperactivity disorder, unspecified type: Secondary | ICD-10-CM

## 2022-05-02 DIAGNOSIS — G4700 Insomnia, unspecified: Secondary | ICD-10-CM | POA: Diagnosis not present

## 2022-05-02 MED ORDER — AMPHETAMINE-DEXTROAMPHET ER 30 MG PO CP24: 30.0000 mg | ORAL_CAPSULE | Freq: Every day | ORAL | 0 refills | Status: DC

## 2022-05-02 NOTE — Progress Notes (Signed)
Jocelyn Waters [id number] 04/03/1981 41 y.o.  Subjective:   Patient ID:  Jocelyn Waters is a 41 y.o. (DOB [birth year]) female.  Chief Complaint: No chief complaint on file.   HPI Jocelyn Waters presents to the office today for follow-up of GAD, SAD, panic attacks and ADD.  Describes mood today as "ok". Pleasant. Mood symptoms - denies depression and irritability. Reports decreased periods of anxiety - "not as bad". Denies recent panic attacks. Decreased worry and rumination. Decreased overthinking and over processing. Stating "I'm doing a lot better". Feels like restarting Adderall has been helpful - not lasting throughout the day. Improved interest and motivation. Taking medications as prescribed.  Energy levels stable. Active, does not have a regular exercise routine. Enjoys some usual interests and activities. Married. Lives with husband and 3 children. Sister local. Parents 2 hours away. Spending time with family. Appetite adequate. Weight loss - 190 pounds. Sleeps well most nights. Averages 7 to 8 hours. Focus and concentration improved, but still having issues. Completing tasks. Managing aspects of household. Works for the [employer]. Denies SI or HI.  Denies AH or VH. Denies self harm. Denies substance use.  Previous medication trials: Xanax, Adderall XR, Lorazepam,     Flowsheet Row Admission (Discharged) from 02/16/2021 in [facility]S Mother Baby Unit Admission (Discharged) from 01/17/2021 in [facility] 1S Maternity Assessment Unit Admission (Discharged) from 11/16/2020 in [facility] 1S Maternity Assessment Unit  C-SSRS RISK CATEGORY No Risk No Risk No Risk        Review of Systems:  Review of Systems  Musculoskeletal:  Negative for gait problem.  Neurological:  Negative for tremors.  Psychiatric/Behavioral:         Please refer to HPI    Medications: I have reviewed the patient's current medications.  Current Outpatient Medications  Medication Sig Dispense Refill    amphetamine-dextroamphetamine (ADDERALL XR) 30 MG 24 hr capsule Take 1 capsule (30 mg total) by mouth daily. 30 capsule 0   [START ON 05/30/2022] amphetamine-dextroamphetamine (ADDERALL XR) 30 MG 24 hr capsule Take 1 capsule (30 mg total) by mouth daily. 30 capsule 0   [START ON 06/27/2022] amphetamine-dextroamphetamine (ADDERALL XR) 30 MG 24 hr capsule Take 1 capsule (30 mg total) by mouth daily. 30 capsule 0   cetirizine (ZYRTEC) 10 MG tablet Take 10 mg by mouth daily.     COVID-19 mRNA bivalent vaccine, Pfizer, (PFIZER COVID-19 VAC BIVALENT) injection Inject into the muscle. 0.3 mL 0   FEROSUL 325 (65 Fe) MG tablet Take 325 mg by mouth 2 (two) times daily.     FLUoxetine (PROZAC) 40 MG capsule TAKE 1 CAPSULE (40 MG TOTAL) BY MOUTH DAILY. 90 capsule 2   ibuprofen (ADVIL) 600 MG tablet Take 1 tablet (600 mg total) by mouth every 6 (six) hours. 30 tablet 0   ondansetron (ZOFRAN-ODT) 8 MG disintegrating tablet Take by mouth.     pantoprazole (PROTONIX) 40 MG tablet Take 1 tablet by mouth daily.     traZODone (DESYREL) 50 MG tablet TAKE 1 TABLET BY MOUTH EVERYDAY AT BEDTIME 90 tablet 2   No current facility-administered medications for this visit.    Medication Side Effects: None  Allergies:  Allergies  Allergen Reactions   Sulfamethoxazole-Trimethoprim     Other reaction(s): rash   Biaxin [Clarithromycin] Rash   Septra [Bactrim] Rash   Sulfa Antibiotics Rash and Other (See Comments)    Past Medical History:  Diagnosis Date   ADD (attention deficit disorder)    Anxiety  Depression    History of abnormal cervical Pap smear 2007   History of gestational hypertension    History of kidney stones 2016   History of pre-eclampsia    History of pregnancy induced hypertension    Hx of varicella    PONV (postoperative nausea and vomiting)    Pregnancy induced hypertension     Past Medical History, Surgical history, Social history, and Family history were reviewed and updated as  appropriate.   Please see review of systems for further details on the patient's review from today.   Objective:   Physical Exam:  There were no vitals taken for this visit.  Physical Exam Constitutional:      General: She is not in acute distress. Musculoskeletal:        General: No deformity.  Neurological:     Mental Status: She is alert and oriented to person, place, and time.     Coordination: Coordination normal.  Psychiatric:        Attention and Perception: Attention and perception normal. She does not perceive auditory or visual hallucinations.        Mood and Affect: Mood normal. Mood is not anxious or depressed. Affect is not labile, blunt, angry or inappropriate.        Speech: Speech normal.        Behavior: Behavior normal.        Thought Content: Thought content normal. Thought content is not paranoid or delusional. Thought content does not include homicidal or suicidal ideation. Thought content does not include homicidal or suicidal plan.        Cognition and Memory: Cognition and memory normal.        Judgment: Judgment normal.     Comments: Insight intact     Lab Review:     Component Value Date/Time   NA 134 (L) 02/16/2021 1748   K 4.2 02/16/2021 1748   CL 104 02/16/2021 1748   CO2 21 (L) 02/16/2021 1748   GLUCOSE 86 02/16/2021 1748   BUN 7 02/16/2021 1748   CREATININE 0.60 02/16/2021 1748   CALCIUM 8.7 (L) 02/16/2021 1748   PROT 5.3 (L) 02/16/2021 1748   ALBUMIN 2.5 (L) 02/16/2021 1748   AST 17 02/16/2021 1748   ALT 12 02/16/2021 1748   ALKPHOS 70 02/16/2021 1748   BILITOT 0.3 02/16/2021 1748   GFRNONAA >60 02/16/2021 1748   GFRAA >60 02/10/2020 1222       Component Value Date/Time   WBC 15.2 (H) 02/18/2021 0511   RBC 3.89 02/18/2021 0511   HGB 10.5 (L) 02/18/2021 0511   HCT 32.6 (L) 02/18/2021 0511   PLT 173 02/18/2021 0511   MCV 83.8 02/18/2021 0511   MCH 27.0 02/18/2021 0511   MCHC 32.2 02/18/2021 0511   RDW 17.5 (H) 02/18/2021 0511    LYMPHSABS 1.4 07/01/2016 1614   MONOABS 0.4 07/01/2016 1614   EOSABS 0.0 07/01/2016 1614   BASOSABS 0.0 07/01/2016 1614    No results found for: "POCLITH", "LITHIUM"   No results found for: "PHENYTOIN", "PHENOBARB", "VALPROATE", "CBMZ"   .res Assessment: Plan:    Plan:  PDMP reviewed  Prozac 40mg  daily Adderall XR 20mg  Trazadone 50mg  at bedtime  RTC 4 weeks  Patient advised to contact office with any questions, adverse effects, or acute worsening in signs and symptoms. Diagnoses and all orders for this visit:  Generalized anxiety disorder  Attention deficit hyperactivity disorder (ADHD), unspecified ADHD type -     amphetamine-dextroamphetamine (ADDERALL XR) 30  MG 24 hr capsule; Take 1 capsule (30 mg total) by mouth daily. -     amphetamine-dextroamphetamine (ADDERALL XR) 30 MG 24 hr capsule; Take 1 capsule (30 mg total) by mouth daily. -     amphetamine-dextroamphetamine (ADDERALL XR) 30 MG 24 hr capsule; Take 1 capsule (30 mg total) by mouth daily.  Panic attacks  Insomnia, unspecified type     Please see After Visit Summary for patient specific instructions.  No future appointments.  No orders of the defined types were placed in this encounter.   -------------------------------

## 2022-05-16 ENCOUNTER — Other Ambulatory Visit: Payer: Self-pay | Admitting: Adult Health

## 2022-05-16 DIAGNOSIS — F410 Panic disorder [episodic paroxysmal anxiety] without agoraphobia: Secondary | ICD-10-CM

## 2022-05-16 DIAGNOSIS — F411 Generalized anxiety disorder: Secondary | ICD-10-CM

## 2022-05-16 DIAGNOSIS — G4700 Insomnia, unspecified: Secondary | ICD-10-CM

## 2022-08-01 IMAGING — US US ABDOMEN LIMITED
1 series · 15 of 25 positions shown · non-contrast
Comparison: CT abdomen/pelvis 02/10/2020.

CLINICAL DATA: Abdominal pain affecting pregnancy. Additional
history provided by scanning technologist: Patient reports pain
(upper abdominal cramping), prior cholecystectomy, patient 5 weeks
and 6 days pregnant.

EXAM:
ULTRASOUND ABDOMEN LIMITED RIGHT UPPER QUADRANT

[Series 1: us abdomen limited · 15 of 40 slices shown]
[im 1/40]
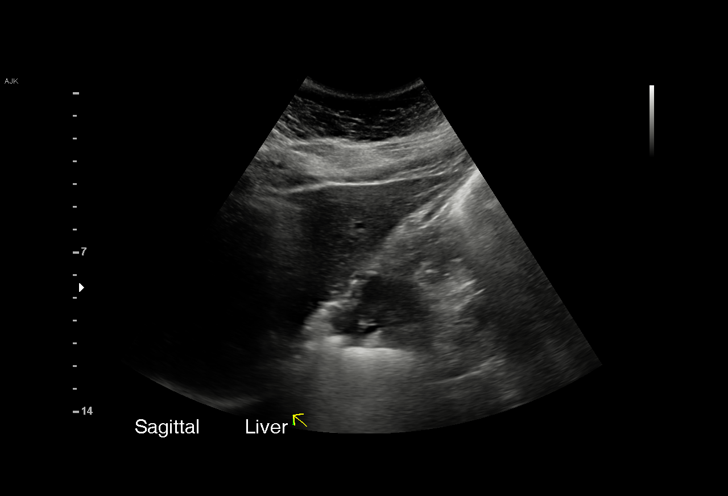
[im 4/40]
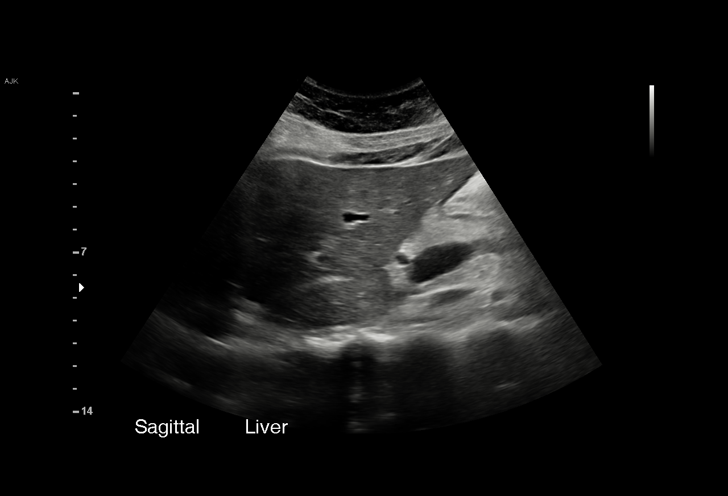
[im 7/40]
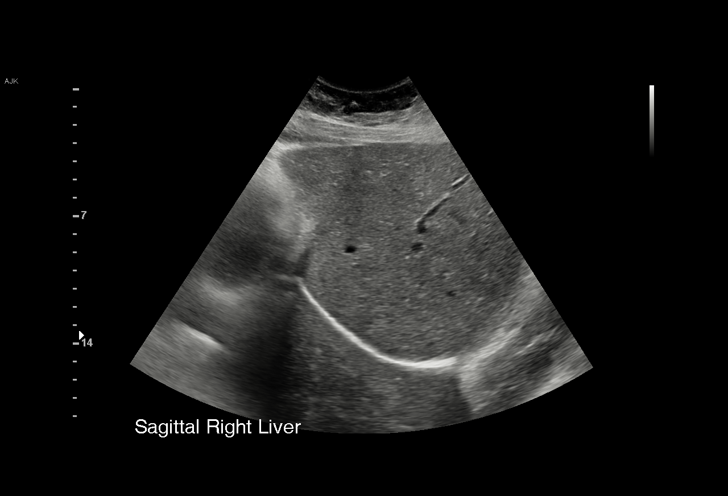
[im 9/40]
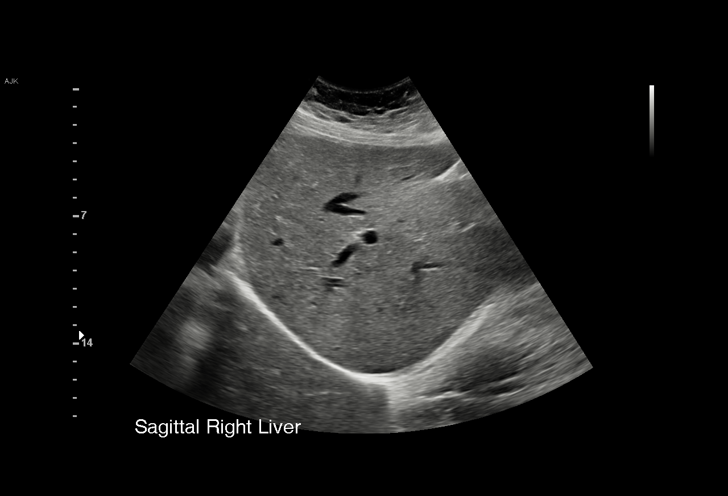
[im 12/40]
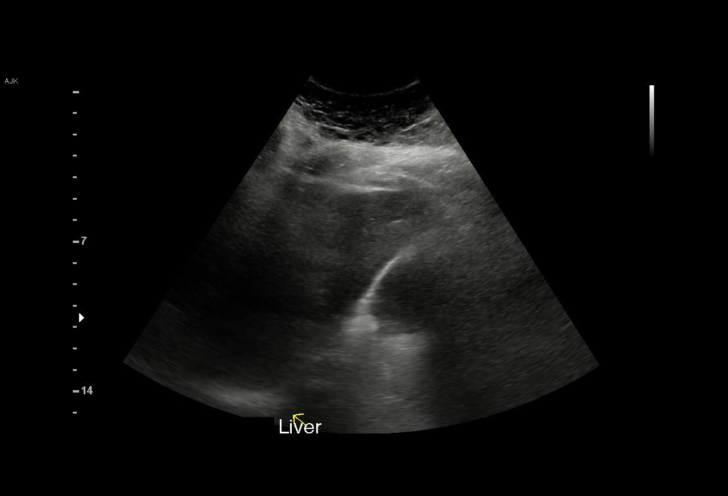
[im 15/40]
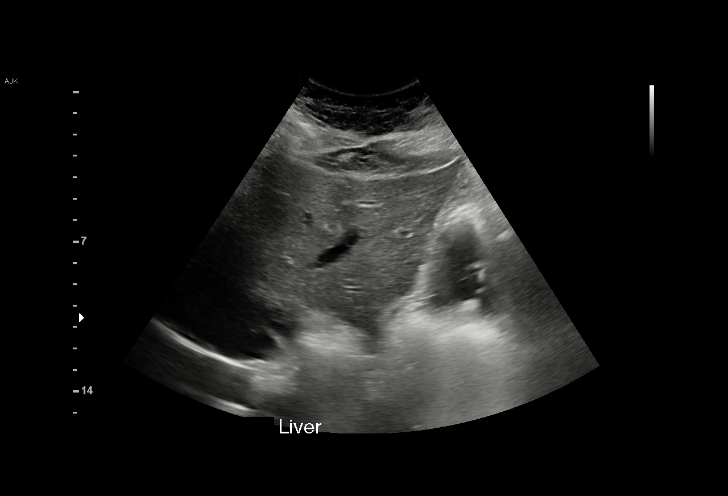
[im 17/40]
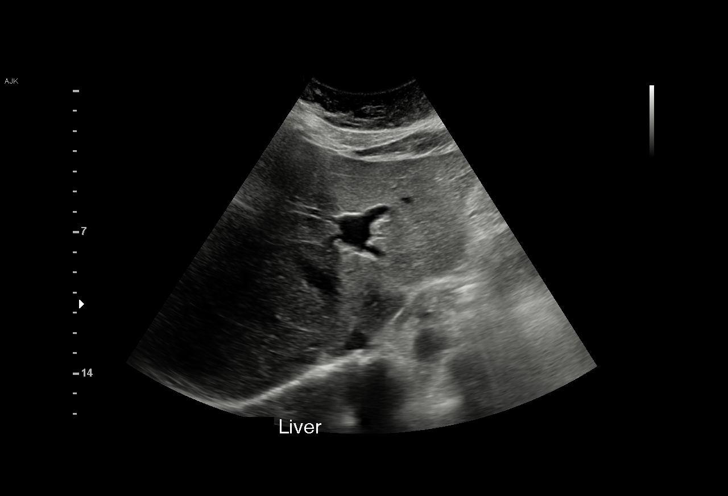
[im 20/40]
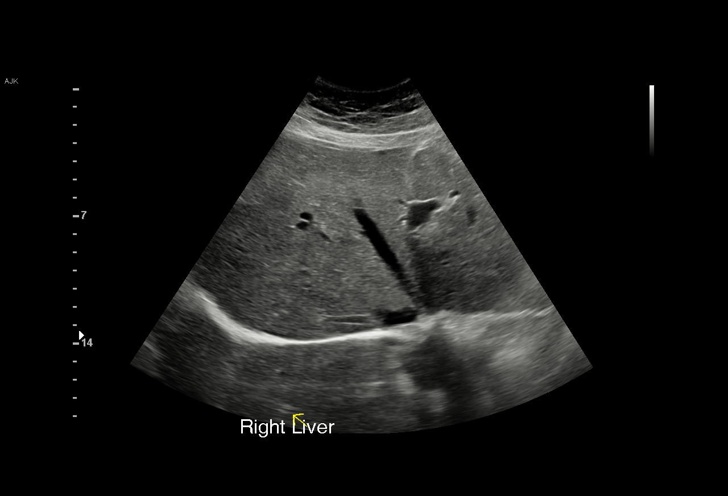
[im 23/40]
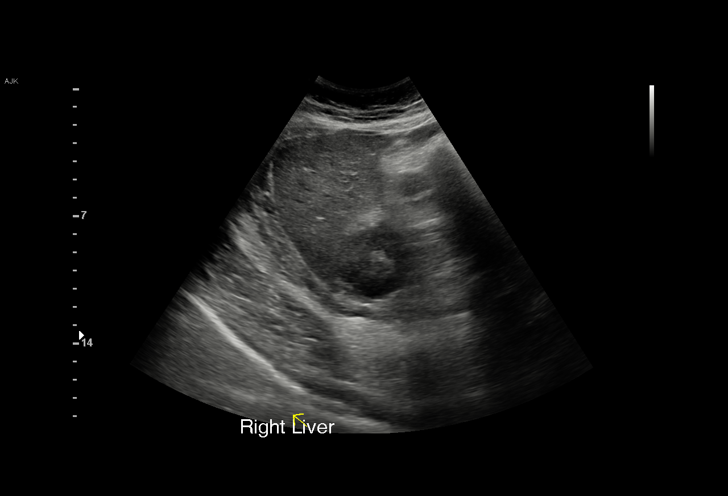
[im 25/40]
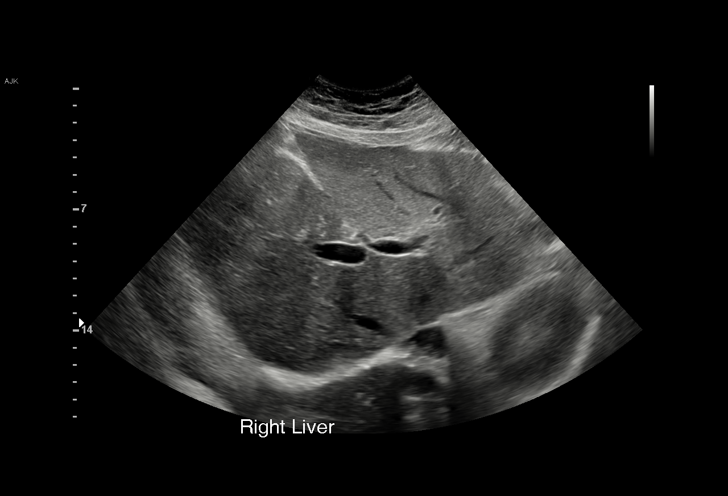
[im 28/40]
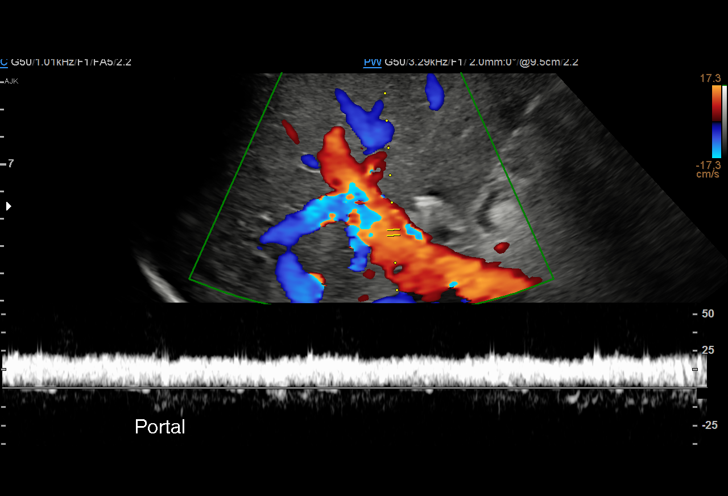
[im 31/40]
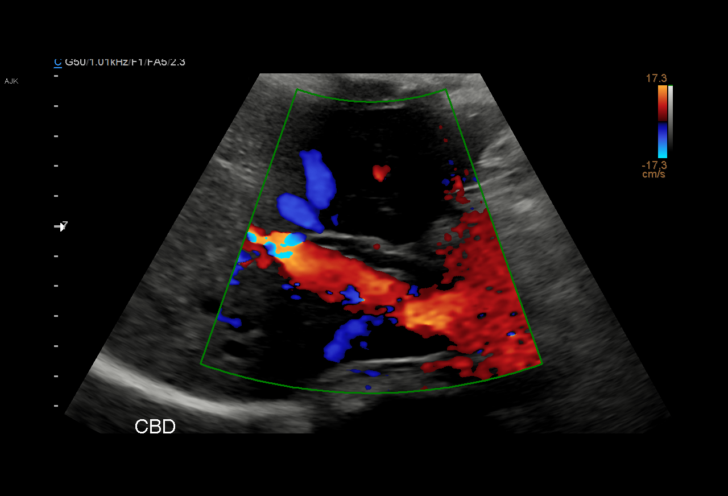
[im 33/40]
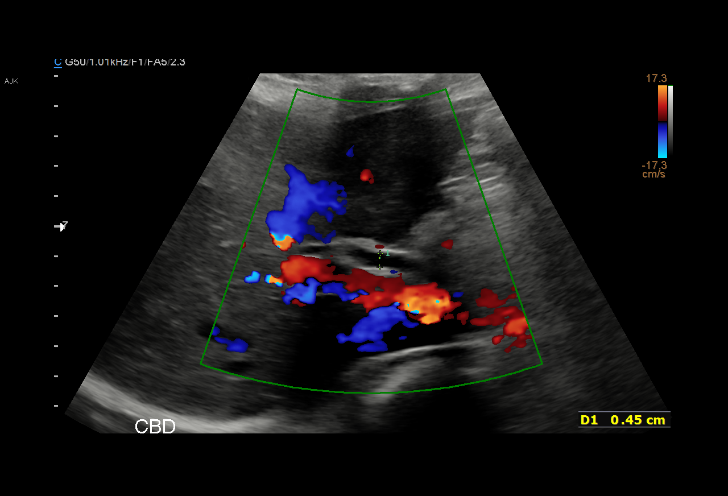
[im 36/40]
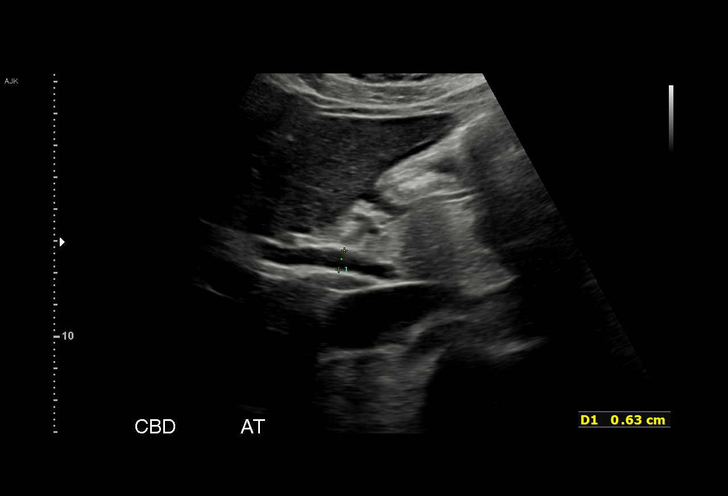
[im 40/40]
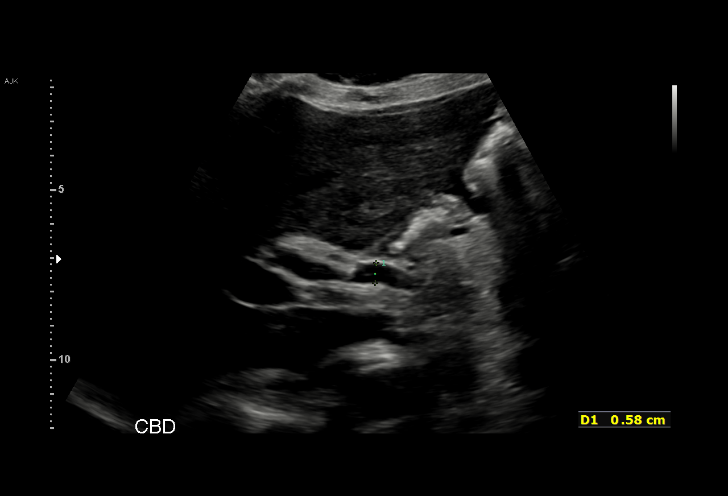

[15 of 25 positions shown; findings below may reference images not displayed]

FINDINGS: Gallbladder:

Prior cholecystectomy. No pain is elicited while scanning over the
gallbladder fossa.

Common bile duct:

Diameter: 6 mm, within normal limits.

Liver:

No focal lesion identified. Within normal limits in parenchymal
echogenicity. Portal vein is patent on color Doppler imaging with
normal direction of blood flow towards the liver.
IMPRESSION: Prior cholecystectomy.

Otherwise unremarkable right upper quadrant ultrasound, as
described.

## 2022-08-02 ENCOUNTER — Encounter: Payer: Self-pay | Admitting: Adult Health

## 2022-08-02 ENCOUNTER — Ambulatory Visit: Payer: 59 | Admitting: Adult Health

## 2022-08-02 DIAGNOSIS — F410 Panic disorder [episodic paroxysmal anxiety] without agoraphobia: Secondary | ICD-10-CM | POA: Diagnosis not present

## 2022-08-02 DIAGNOSIS — F909 Attention-deficit hyperactivity disorder, unspecified type: Secondary | ICD-10-CM | POA: Diagnosis not present

## 2022-08-02 DIAGNOSIS — G4700 Insomnia, unspecified: Secondary | ICD-10-CM

## 2022-08-02 DIAGNOSIS — F411 Generalized anxiety disorder: Secondary | ICD-10-CM

## 2022-08-02 MED ORDER — AMPHETAMINE-DEXTROAMPHET ER 30 MG PO CP24: 30.0000 mg | ORAL_CAPSULE | Freq: Every day | ORAL | 0 refills | Status: DC

## 2022-08-02 MED ORDER — FLUOXETINE HCL 40 MG PO CAPS: 40.0000 mg | ORAL_CAPSULE | Freq: Every day | ORAL | 0 refills | Status: DC

## 2022-08-02 MED ORDER — TRAZODONE HCL 50 MG PO TABS: ORAL_TABLET | ORAL | 0 refills | Status: DC

## 2022-08-02 NOTE — Progress Notes (Signed)
Jocelyn Waters [id number] 1981-01-13 41 y.o.  Subjective:   Patient ID:  Jocelyn Waters is a 41 y.o. (DOB 1981-07-28) female.  Chief Complaint: No chief complaint on file.   HPI Jocelyn Waters presents to the office today for follow-up of GAD, SAD, panic attacks and ADD.  Describes mood today as "ok". Pleasant. Mood symptoms - denies depression and irritability. Reports     anxiety - "once in a while, nothing like it was". Denies recent panic attacks. Decreased worry and rumination. Decreased overthinking and over processing. Mood is consistent. Stating "I'm doing really good". Feels like medications are working well. Improved interest and motivation. Taking medications as prescribed.  Energy levels stable. Active, has a regular exercise routine - working with [employer].  Enjoys some usual interests and activities. Married. Lives with husband and 3 children. Sister local. Parents 2 hours away. Spending time with family. Appetite adequate. Weight loss - 190 pounds. Sleeps well most nights. Averages 7 to 8 hours. Focus and concentration improved - completing tasks. Completing tasks. Managing aspects of household. Works for the [employer] Denies SI or HI.  Denies AH or VH. Denies self harm. Denies substance use.  Previous medication trials: Xanax, Adderall XR, Lorazepam,    Flowsheet Row Admission (Discharged) from 02/16/2021 in [facility] 5S Mother Baby Unit Admission (Discharged) from 01/17/2021 in [facility] Admission (Discharged) from 11/16/2020 in [facility] Unit  C-SSRS RISK CATEGORY No Risk No Risk No Risk        Review of Systems:  Review of Systems  Musculoskeletal:  Negative for gait problem.  Neurological:  Negative for tremors.  Psychiatric/Behavioral:         Please refer to HPI    Medications: I have reviewed the patient's current medications.  Current Outpatient Medications  Medication Sig Dispense Refill    amphetamine-dextroamphetamine (ADDERALL XR) 30 MG 24 hr capsule Take 1 capsule (30 mg total) by mouth daily. 30 capsule 0   [START ON 08/30/2022] amphetamine-dextroamphetamine (ADDERALL XR) 30 MG 24 hr capsule Take 1 capsule (30 mg total) by mouth daily. 30 capsule 0   [START ON 09/27/2022] amphetamine-dextroamphetamine (ADDERALL XR) 30 MG 24 hr capsule Take 1 capsule (30 mg total) by mouth daily. 30 capsule 0   cetirizine (ZYRTEC) 10 MG tablet Take 10 mg by mouth daily.     COVID-19 mRNA bivalent vaccine, Pfizer, (PFIZER COVID-19 VAC BIVALENT) injection Inject into the muscle. 0.3 mL 0   FEROSUL 325 (65 Fe) MG tablet Take 325 mg by mouth 2 (two) times daily.     FLUoxetine (PROZAC) 40 MG capsule Take 1 capsule (40 mg total) by mouth daily. 90 capsule 0   ibuprofen (ADVIL) 600 MG tablet Take 1 tablet (600 mg total) by mouth every 6 (six) hours. 30 tablet 0   ondansetron (ZOFRAN-ODT) 8 MG disintegrating tablet Take by mouth.     pantoprazole (PROTONIX) 40 MG tablet Take 1 tablet by mouth daily.     traZODone (DESYREL) 50 MG tablet TAKE 1 TABLET BY MOUTH EVERYDAY AT BEDTIME 90 tablet 0   No current facility-administered medications for this visit.    Medication Side Effects: None  Allergies:  Allergies  Allergen Reactions   Sulfamethoxazole-Trimethoprim     Other reaction(s): rash   Biaxin [Clarithromycin] Rash   Septra [Bactrim] Rash   Sulfa Antibiotics Rash and Other (See Comments)    Past Medical History:  Diagnosis Date   ADD (attention deficit disorder)  Anxiety    Depression    History of abnormal cervical Pap smear 2007   History of gestational hypertension    History of kidney stones 2016   History of pre-eclampsia    History of pregnancy induced hypertension    Hx of varicella    PONV (postoperative nausea and vomiting)    Pregnancy induced hypertension     Past Medical History, Surgical history, Social history, and Family history were reviewed and updated as  appropriate.   Please see review of systems for further details on the patient's review from today.   Objective:   Physical Exam:  There were no vitals taken for this visit.  Physical Exam Constitutional:      General: She is not in acute distress. Musculoskeletal:        General: No deformity.  Neurological:     Mental Status: She is alert and oriented to person, place, and time.     Coordination: Coordination normal.  Psychiatric:        Attention and Perception: Attention and perception normal. She does not perceive auditory or visual hallucinations.        Mood and Affect: Mood normal. Mood is not anxious or depressed. Affect is not labile, blunt, angry or inappropriate.        Speech: Speech normal.        Behavior: Behavior normal.        Thought Content: Thought content normal. Thought content is not paranoid or delusional. Thought content does not include homicidal or suicidal ideation. Thought content does not include homicidal or suicidal plan.        Cognition and Memory: Cognition and memory normal.        Judgment: Judgment normal.     Comments: Insight intact     Lab Review:     Component Value Date/Time   NA 134 (L) 02/16/2021 1748   K 4.2 02/16/2021 1748   CL 104 02/16/2021 1748   CO2 21 (L) 02/16/2021 1748   GLUCOSE 86 02/16/2021 1748   BUN 7 02/16/2021 1748   CREATININE 0.60 02/16/2021 1748   CALCIUM 8.7 (L) 02/16/2021 1748   PROT 5.3 (L) 02/16/2021 1748   ALBUMIN 2.5 (L) 02/16/2021 1748   AST 17 02/16/2021 1748   ALT 12 02/16/2021 1748   ALKPHOS 70 02/16/2021 1748   BILITOT 0.3 02/16/2021 1748   GFRNONAA >60 02/16/2021 1748   GFRAA >60 02/10/2020 1222       Component Value Date/Time   WBC 15.2 (H) 02/18/2021 0511   RBC 3.89 02/18/2021 0511   HGB 10.5 (L) 02/18/2021 0511   HCT 32.6 (L) 02/18/2021 0511   PLT 173 02/18/2021 0511   MCV 83.8 02/18/2021 0511   MCH 27.0 02/18/2021 0511   MCHC 32.2 02/18/2021 0511   RDW 17.5 (H) 02/18/2021 0511    LYMPHSABS 1.4 07/01/2016 1614   MONOABS 0.4 07/01/2016 1614   EOSABS 0.0 07/01/2016 1614   BASOSABS 0.0 07/01/2016 1614    No results found for: "POCLITH", "LITHIUM"   No results found for: "PHENYTOIN", "PHENOBARB", "VALPROATE", "CBMZ"   .res Assessment: Plan:    Plan:  PDMP reviewed  Prozac 40mg  daily Adderall XR 20mg  Trazadone 50mg  at bedtime - taking occasionally  Monitor BP between visits while taking stimulant medication.   120/80 per patient report  RTC 4 weeks  Patient advised to contact office with any questions, adverse effects, or acute worsening in signs and symptoms.  Diagnoses and all orders for this  visit:  Insomnia, unspecified type -     traZODone (DESYREL) 50 MG tablet; TAKE 1 TABLET BY MOUTH EVERYDAY AT BEDTIME  Generalized anxiety disorder -     FLUoxetine (PROZAC) 40 MG capsule; Take 1 capsule (40 mg total) by mouth daily.  Panic attacks -     FLUoxetine (PROZAC) 40 MG capsule; Take 1 capsule (40 mg total) by mouth daily.  Attention deficit hyperactivity disorder (ADHD), unspecified ADHD type -     amphetamine-dextroamphetamine (ADDERALL XR) 30 MG 24 hr capsule; Take 1 capsule (30 mg total) by mouth daily. -     amphetamine-dextroamphetamine (ADDERALL XR) 30 MG 24 hr capsule; Take 1 capsule (30 mg total) by mouth daily. -     amphetamine-dextroamphetamine (ADDERALL XR) 30 MG 24 hr capsule; Take 1 capsule (30 mg total) by mouth daily.     Please see After Visit Summary for patient specific instructions.  No future appointments.  No orders of the defined types were placed in this encounter.   -------------------------------

## 2022-09-10 ENCOUNTER — Encounter: Payer: Self-pay | Admitting: Emergency Medicine

## 2022-09-10 ENCOUNTER — Ambulatory Visit
Admission: EM | Admit: 2022-09-10 | Discharge: 2022-09-10 | Disposition: A | Discharge status: Home / Self Care | Payer: 59 | Attending: Internal Medicine | Admitting: Internal Medicine

## 2022-09-10 ENCOUNTER — Other Ambulatory Visit: Payer: Self-pay

## 2022-09-10 DIAGNOSIS — U071 COVID-19: Secondary | ICD-10-CM | POA: Diagnosis present

## 2022-09-10 MED ORDER — ACETAMINOPHEN 325 MG PO TABS
650.0000 mg | ORAL_TABLET | Freq: Once | ORAL | Status: AC
  Administered 2022-09-10: 650 mg via ORAL

## 2022-09-10 NOTE — ED Provider Notes (Addendum)
[facility]    CSN: [id number] Arrival date & time: 09/10/22  1029      History   Chief Complaint Chief Complaint  Patient presents with   Cough    HPI Jocelyn Waters is a 42 y.o. female.   Patient presents with cough, nasal congestion, generalized bodyaches that have been present for 3 days.  Patient reports that she is here to get a COVID test for work as she tested positive for COVID-19 with at home test today.  Her family members all have COVID-19 as well.  Patient denies history of asthma or COPD.  Denies chest pain, shortness of breath, sore throat, ear pain, nausea, vomiting, diarrhea, abdominal pain.  Has taken Advil Cold and Sinus with improvement in symptoms.   Cough   Past Medical History:  Diagnosis Date   ADD (attention deficit disorder)    Anxiety    Depression    History of abnormal cervical Pap smear 2007   History of gestational hypertension    History of kidney stones 2016   History of pre-eclampsia    History of pregnancy induced hypertension    Hx of varicella    PONV (postoperative nausea and vomiting)    Pregnancy induced hypertension     Patient Active Problem List   Diagnosis Date Noted   Normal postpartum course 02/19/2021   H/O: depression 02/19/2021   SVD (7/17) 02/18/2021   Gestational hypertension w/o significant proteinuria in 3rd trimester 02/16/2021   Anxiety disorder 06/15/2020   ADD (attention deficit disorder)     Past Surgical History:  Procedure Laterality Date   CHOLECYSTECTOMY N/A 12/16/2019   Procedure: LAPAROSCOPIC CHOLECYSTECTOMY;  Surgeon: Rolm Bookbinder, MD;  Location: [place];  Service: General;  Laterality: N/A;  GENERAL WITH BILATERAL TAP BLOCK   DILATION AND EVACUATION N/A 10/13/2019   Procedure: SUCTION  DILATATION AND EVACUATION WITH CHROMOSOME STUDIES;  Surgeon: Sanjuana Kava, MD;  Location: [place];  Service: Gynecology;  Laterality: N/A;   KNEE ARTHROSCOPY  Right 2000   LAPAROSCOPIC APPENDECTOMY N/A 02/10/2020   Procedure: APPENDECTOMY LAPAROSCOPIC;  Surgeon: Armandina Gemma, MD;  Location: [facility];  Service: General;  Laterality: N/A;   THERAPEUTIC ABORTION  05/2006   WISDOM TOOTH EXTRACTION  teen    OB History     Gravida  6   Para  3   Term  3   Preterm  0   AB  3   Living  3      SAB  2   IAB  1   Ectopic  0   Multiple  0   Live Births  3            Home Medications    Prior to Admission medications   Medication Sig Start Date End Date Taking? Authorizing Provider  amphetamine-dextroamphetamine (ADDERALL XR) 30 MG 24 hr capsule Take 1 capsule (30 mg total) by mouth daily. 08/02/22   Mozingo, Berdie Ogren, NP  amphetamine-dextroamphetamine (ADDERALL XR) 30 MG 24 hr capsule Take 1 capsule (30 mg total) by mouth daily. 08/30/22   Mozingo, Berdie Ogren, NP  amphetamine-dextroamphetamine (ADDERALL XR) 30 MG 24 hr capsule Take 1 capsule (30 mg total) by mouth daily. 09/27/22   Mozingo, Berdie Ogren, NP  cetirizine (ZYRTEC) 10 MG tablet Take 10 mg by mouth daily.    [provider]  COVID-19 mRNA bivalent vaccine, Pfizer, (PFIZER COVID-19 VAC BIVALENT) injection Inject into the muscle. 05/25/21   Carlyle Basques,  MD  FEROSUL 325 (65 Fe) MG tablet Take 325 mg by mouth 2 (two) times daily. 12/18/20   [provider]  FLUoxetine (PROZAC) 40 MG capsule Take 1 capsule (40 mg total) by mouth daily. 08/02/22   Mozingo, Berdie Ogren, NP  ibuprofen (ADVIL) 600 MG tablet Take 1 tablet (600 mg total) by mouth every 6 (six) hours. 02/20/21   [place], Luvenia Starch, FNP  ondansetron (ZOFRAN-ODT) 8 MG disintegrating tablet Take by mouth. 01/08/21   [provider]  pantoprazole (PROTONIX) 40 MG tablet Take 1 tablet by mouth daily. 11/05/20   [provider]  traZODone (DESYREL) 50 MG tablet TAKE 1 TABLET BY MOUTH EVERYDAY AT BEDTIME 08/02/22   Mozingo, Berdie Ogren, NP    Family History Family History   Problem Relation Age of Onset   Diabetes Maternal Aunt    Hypertension Maternal Grandmother    Hypertension Paternal Grandmother    Diabetes Paternal Grandfather     Social History Social History   Tobacco Use   Smoking status: Former    Years: 15.00    Types: Cigarettes    Quit date: 08/31/2012    Years since quitting: 10.0   Smokeless tobacco: Never  Vaping Use   Vaping Use: Former   Quit date: 10/11/2012  Substance Use Topics   Alcohol use: Not Currently    Comment: SOCIALY   Drug use: No     Allergies   Sulfamethoxazole-trimethoprim, Biaxin [clarithromycin], Septra [bactrim], and Sulfa antibiotics   Review of Systems Review of Systems Per HPI  Physical Exam Triage Vital Signs ED Triage Vitals [09/10/22 1137]  Enc Vitals Group     BP 111/65     Pulse Rate (!) 108     Resp 18     Temp 98.1 F (36.7 C)     Temp Source Oral     SpO2 97 %     Weight      Height      Head Circumference      Peak Flow      Pain Score 2     Pain Loc      Pain Edu?      Excl. in [place]?    No data found.  Updated Vital Signs BP 111/65 (BP Location: Left Arm)   Pulse 95   Temp 98.1 F (36.7 C) (Oral)   Resp 18   SpO2 97%   Visual Acuity Right Eye Distance:   Left Eye Distance:   Bilateral Distance:    Right Eye Near:   Left Eye Near:    Bilateral Near:     Physical Exam Constitutional:      General: She is not in acute distress.    Appearance: Normal appearance. She is not toxic-appearing or diaphoretic.  HENT:     Head: Normocephalic and atraumatic.     Right Ear: Tympanic membrane and ear canal normal.     Left Ear: Tympanic membrane and ear canal normal.     Nose: Congestion present.     Mouth/Throat:     Mouth: Mucous membranes are moist.     Pharynx: No posterior oropharyngeal erythema.  Eyes:     Extraocular Movements: Extraocular movements intact.     Conjunctiva/sclera: Conjunctivae normal.     Pupils: Pupils are equal, round, and reactive to  light.  Cardiovascular:     Rate and Rhythm: Regular rhythm. Tachycardia present.     Pulses: Normal pulses.     Heart sounds: Normal heart sounds.  Pulmonary:     Effort: Pulmonary effort is normal. No respiratory distress.     Breath sounds: Normal breath sounds. No stridor. No wheezing, rhonchi or rales.  Abdominal:     General: Abdomen is flat. Bowel sounds are normal.     Palpations: Abdomen is soft.  Musculoskeletal:        General: Normal range of motion.     Cervical back: Normal range of motion.  Skin:    General: Skin is warm and dry.  Neurological:     General: No focal deficit present.     Mental Status: She is alert and oriented to person, place, and time. Mental status is at baseline.  Psychiatric:        Mood and Affect: Mood normal.        Behavior: Behavior normal.      UC Treatments / Results  Labs (all labs ordered are listed, but only abnormal results are displayed) Labs Reviewed  SARS CORONAVIRUS 2 (TAT 6-24 HRS)    EKG   Radiology No results found.  Procedures Procedures (including critical care time)  Medications Ordered in UC Medications  acetaminophen (TYLENOL) tablet 650 mg (650 mg Oral Given 09/10/22 1204)    Initial Impression / Assessment and Plan / UC Course  I have reviewed the triage vital signs and the nursing notes.  Pertinent labs & imaging results that were available during my care of the patient were reviewed by me and considered in my medical decision making (see chart for details).     Patient tested positive for COVID-19 with an at home test.  Patient requesting COVID PCR for workplace requirements which is pending.  Discussed COVID antivirals with patient but she declined with shared decision making.  Advised supportive care and symptom management.  Patient was mildly tachycardic on initial triage and physical exam.  Tylenol administered with improvement in heart rate.  Therefore, suspect viral illness and fever was  contributing and do not think any further workup is necessary for this.  Discussed quarantine with patient.  Discussed return precautions.  Patient verbalized understanding and was agreeable with plan. Final Clinical Impressions(s) / UC Diagnoses   Final diagnoses:  [id number]     Discharge Instructions      Follow-up if any symptoms persist or worsen.  Recommend quarantine 5 days from the start.  Recommend wearing a mask for additional 5 days if around other people.     ED Prescriptions   None    PDMP not reviewed this encounter.   Teodora Medici, [place] 09/10/22 1236    Teodora Medici, [place] 09/10/22 1236

## 2022-09-10 NOTE — ED Triage Notes (Signed)
Pt sts cough and body aches x 3 days; pt sts home covid test was positive but needs official for her work

## 2022-09-10 NOTE — Discharge Instructions (Addendum)
Follow-up if any symptoms persist or worsen.  Recommend quarantine 5 days from the start.  Recommend wearing a mask for additional 5 days if around other people.

## 2022-09-11 LAB — SARS CORONAVIRUS 2 (TAT 6-24 HRS): SARS Coronavirus 2: POSITIVE — AB

## 2022-10-14 ENCOUNTER — Ambulatory Visit
Admission: EM | Admit: 2022-10-14 | Discharge: 2022-10-14 | Disposition: A | Discharge status: Home / Self Care | Payer: 59 | Attending: Internal Medicine | Admitting: Internal Medicine

## 2022-10-14 ENCOUNTER — Telehealth: Payer: Self-pay

## 2022-10-14 DIAGNOSIS — B029 Zoster without complications: Secondary | ICD-10-CM | POA: Diagnosis not present

## 2022-10-14 MED ORDER — VALACYCLOVIR HCL 1 G PO TABS: 1000.0000 mg | ORAL_TABLET | Freq: Three times a day (TID) | ORAL | 0 refills | Status: AC

## 2022-10-14 MED ORDER — VALACYCLOVIR HCL 1 G PO TABS: 1000.0000 mg | ORAL_TABLET | Freq: Three times a day (TID) | ORAL | 0 refills | Status: DC

## 2022-10-14 NOTE — ED Triage Notes (Signed)
Pt c/o pruritic linear rash that "almost feels like a sunburn" on left neck moving towards left shoulder pt states concerned for shingles. Onset ~ thurs.

## 2022-10-14 NOTE — ED Provider Notes (Signed)
[facility]    CSN: [id number] Arrival date & time: 10/14/22  0804      History   Chief Complaint Chief Complaint  Patient presents with   Rash    HPI Jocelyn Waters is a 41 y.o. female.   Patient presents to urgent care for evaluation of rash to the left posterior neck/shoulder region that started on Friday, October 11, 2022.  States that the rash feels like a "sunburn sensation" and started with a fever blister to her mouth.  States she frequently has cold sores when she is stressed and takes Valtrex for this.  Took 1 dose of Valtrex with onset of cold sore to the mouth, then developed rash to the left shoulder.  Rash to the left shoulder is a cluster of raised and red blisters and a singular line.  Patient reports tingling sensation to the left upper extremity and sensitivity with clothes touching the rash.  Denies recent antibiotic or steroid use.  Reports discomfort and pain to the rash.  Rash is localized to the left shoulder and does not spread anywhere else on the body.  Denies history of shingles but reports history of chickenpox in the past.  She is not vaccinated against shingles.  Denies cough, sore throat, fever/chills, dizziness, shortness of breath, eye involvement, ear pain, headache, and abdominal pain.     Past Medical History:  Diagnosis Date   ADD (attention deficit disorder)    Anxiety    Depression    History of abnormal cervical Pap smear 2007   History of gestational hypertension    History of kidney stones 2016   History of pre-eclampsia    History of pregnancy induced hypertension    Hx of varicella    PONV (postoperative nausea and vomiting)    Pregnancy induced hypertension     Patient Active Problem List   Diagnosis Date Noted   Normal postpartum course 02/19/2021   H/O: depression 02/19/2021   SVD (7/17) 02/18/2021   Gestational hypertension w/o significant proteinuria in 3rd trimester 02/16/2021   Anxiety disorder 06/15/2020    ADD (attention deficit disorder)     Past Surgical History:  Procedure Laterality Date   CHOLECYSTECTOMY N/A 12/16/2019   Procedure: LAPAROSCOPIC CHOLECYSTECTOMY;  Surgeon: Rolm Bookbinder, MD;  Location: [place];  Service: General;  Laterality: N/A;  GENERAL WITH BILATERAL TAP BLOCK   DILATION AND EVACUATION N/A 10/13/2019   Procedure: SUCTION  DILATATION AND EVACUATION WITH CHROMOSOME STUDIES;  Surgeon: Sanjuana Kava, MD;  Location: [place];  Service: Gynecology;  Laterality: N/A;   KNEE ARTHROSCOPY Right 2000   LAPAROSCOPIC APPENDECTOMY N/A 02/10/2020   Procedure: APPENDECTOMY LAPAROSCOPIC;  Surgeon: Armandina Gemma, MD;  Location: [facility];  Service: General;  Laterality: N/A;   THERAPEUTIC ABORTION  05/2006   WISDOM TOOTH EXTRACTION  teen    OB History     Gravida  6   Para  3   Term  3   Preterm  0   AB  3   Living  3      SAB  2   IAB  1   Ectopic  0   Multiple  0   Live Births  3            Home Medications    Prior to Admission medications   Medication Sig Start Date End Date Taking? Authorizing Provider  valACYclovir (VALTREX) 1000 MG tablet Take 1 tablet (1,000 mg total) by  mouth 3 (three) times daily for 7 days. 10/14/22 10/21/22 Yes Talbot Grumbling, FNP  amphetamine-dextroamphetamine (ADDERALL XR) 30 MG 24 hr capsule Take 1 capsule (30 mg total) by mouth daily. 08/02/22   Mozingo, Berdie Ogren, NP  amphetamine-dextroamphetamine (ADDERALL XR) 30 MG 24 hr capsule Take 1 capsule (30 mg total) by mouth daily. 08/30/22   Mozingo, Berdie Ogren, NP  amphetamine-dextroamphetamine (ADDERALL XR) 30 MG 24 hr capsule Take 1 capsule (30 mg total) by mouth daily. 09/27/22   Mozingo, Berdie Ogren, NP  cetirizine (ZYRTEC) 10 MG tablet Take 10 mg by mouth daily.    [provider]  COVID-19 mRNA bivalent vaccine, Pfizer, (PFIZER COVID-19 VAC BIVALENT) injection Inject into the muscle. 05/25/21   Carlyle Basques, MD   FEROSUL 325 (65 Fe) MG tablet Take 325 mg by mouth 2 (two) times daily. 12/18/20   [provider]  FLUoxetine (PROZAC) 40 MG capsule Take 1 capsule (40 mg total) by mouth daily. 08/02/22   Mozingo, Berdie Ogren, NP  ibuprofen (ADVIL) 600 MG tablet Take 1 tablet (600 mg total) by mouth every 6 (six) hours. 02/20/21   [place], Luvenia Starch, FNP  ondansetron (ZOFRAN-ODT) 8 MG disintegrating tablet Take by mouth. 01/08/21   [provider]  pantoprazole (PROTONIX) 40 MG tablet Take 1 tablet by mouth daily. 11/05/20   [provider]  traZODone (DESYREL) 50 MG tablet TAKE 1 TABLET BY MOUTH EVERYDAY AT BEDTIME 08/02/22   Mozingo, Berdie Ogren, NP    Family History Family History  Problem Relation Age of Onset   Diabetes Maternal Aunt    Hypertension Maternal Grandmother    Hypertension Paternal Grandmother    Diabetes Paternal Grandfather     Social History Social History   Tobacco Use   Smoking status: Former    Years: 15.00    Types: Cigarettes    Quit date: 08/31/2012    Years since quitting: 10.1   Smokeless tobacco: Never  Vaping Use   Vaping Use: Former   Quit date: 10/11/2012  Substance Use Topics   Alcohol use: Not Currently    Comment: SOCIALY   Drug use: No     Allergies   Sulfamethoxazole-trimethoprim, Biaxin [clarithromycin], Septra [bactrim], and Sulfa antibiotics   Review of Systems Review of Systems Per HPI  Physical Exam Triage Vital Signs ED Triage Vitals  Enc Vitals Group     BP 10/14/22 0822 109/76     Pulse Rate 10/14/22 0822 96     Resp 10/14/22 0822 16     Temp 10/14/22 0822 98.3 F (36.8 C)     Temp Source 10/14/22 0822 Oral     SpO2 10/14/22 0822 97 %     Weight --      Height --      Head Circumference --      Peak Flow --      Pain Score 10/14/22 0823 3     Pain Loc --      Pain Edu? --      Excl. in [place]? --    No data found.  Updated Vital Signs BP 109/76 (BP Location: Left Arm)   Pulse 96   Temp 98.3 F  (36.8 C) (Oral)   Resp 16   SpO2 97%   Breastfeeding No   Visual Acuity Right Eye Distance:   Left Eye Distance:   Bilateral Distance:    Right Eye Near:   Left Eye Near:    Bilateral Near:     Physical  Exam Vitals and nursing note reviewed.  Constitutional:      Appearance: She is not ill-appearing or toxic-appearing.  HENT:     Head: Normocephalic and atraumatic.     Right Ear: Hearing and external ear normal.     Left Ear: Hearing and external ear normal.     Nose: Nose normal.     Mouth/Throat:     Lips: Pink.     Mouth: Mucous membranes are moist.     Pharynx: No posterior oropharyngeal erythema.  Eyes:     General: Lids are normal. Vision grossly intact. Gaze aligned appropriately.     Extraocular Movements: Extraocular movements intact.     Conjunctiva/sclera: Conjunctivae normal.  Cardiovascular:     Rate and Rhythm: Normal rate and regular rhythm.     Heart sounds: Normal heart sounds, S1 normal and S2 normal.  Pulmonary:     Effort: Pulmonary effort is normal. No respiratory distress.     Breath sounds: Normal breath sounds and air entry.  Musculoskeletal:     Cervical back: Neck supple.  Skin:    General: Skin is warm and dry.     Capillary Refill: Capillary refill takes less than 2 seconds.     Findings: Rash present.     Comments: Localized vesicular, raised, erythematous, and linear rash to the C6 dermatome.  See image below for further detail.  Sensation intact bilateral upper and lower extremities.  Neurological:     General: No focal deficit present.     Mental Status: She is alert and oriented to person, place, and time. Mental status is at baseline.     Cranial Nerves: No dysarthria or facial asymmetry.  Psychiatric:        Mood and Affect: Mood normal.        Speech: Speech normal.        Behavior: Behavior normal.        Thought Content: Thought content normal.        Judgment: Judgment normal.      UC Treatments / Results  Labs (all  labs ordered are listed, but only abnormal results are displayed) Labs Reviewed - No data to display  EKG   Radiology No results found.  Procedures Procedures (including critical care time)  Medications Ordered in UC Medications - No data to display  Initial Impression / Assessment and Plan / UC Course  I have reviewed the triage vital signs and the nursing notes.  Pertinent labs & imaging results that were available during my care of the patient were reviewed by me and considered in my medical decision making (see chart for details).   1.  Herpes zoster without complication Presentation is consistent with acute shingles infection.  Valtrex 3 times a day for the next 7 days sent to pharmacy to be taken as prescribed.  May use ibuprofen/Tylenol as needed for pain associated with rash.  Advised to wash hands frequently.  Strict ER and urgent care return precautions discussed.  Patient is agreeable with plan. No systemic symptoms.  PCP follow-up in the next 1 to 2 weeks recommended.  Discussed physical exam and available lab work findings in clinic with patient.  Counseled patient regarding appropriate use of medications and potential side effects for all medications recommended or prescribed today. Discussed red flag signs and symptoms of worsening condition,when to call the PCP office, return to urgent care, and when to seek higher level of care in the emergency department. Patient verbalizes understanding and agreement  with plan. All questions answered. Patient discharged in stable condition.    Final Clinical Impressions(s) / UC Diagnoses   Final diagnoses:  Herpes zoster without complication     Discharge Instructions      Take Valtrex with breakfast, lunch, and dinner for the next 7 days.   You may use tylenol/ibuprofen as needed for pain associated with rash.   If you develop any new or worsening symptoms or do not improve in the next 2 to 3 days, please return.  If your  symptoms are severe, please go to the emergency room.  Follow-up with your primary care provider for further evaluation and management of your symptoms as well as ongoing wellness visits.  I hope you feel better!    ED Prescriptions     Medication Sig Dispense Auth. Provider   valACYclovir (VALTREX) 1000 MG tablet Take 1 tablet (1,000 mg total) by mouth 3 (three) times daily for 7 days. 21 tablet Talbot Grumbling, FNP      PDMP not reviewed this encounter.   Joella [place], [place] 10/14/22 [phone]

## 2022-10-14 NOTE — Discharge Instructions (Signed)
Take Valtrex with breakfast, lunch, and dinner for the next 7 days.   You may use tylenol/ibuprofen as needed for pain associated with rash.   If you develop any new or worsening symptoms or do not improve in the next 2 to 3 days, please return.  If your symptoms are severe, please go to the emergency room.  Follow-up with your primary care provider for further evaluation and management of your symptoms as well as ongoing wellness visits.  I hope you feel better!

## 2022-11-04 ENCOUNTER — Encounter: Payer: Self-pay | Admitting: Adult Health

## 2022-11-04 ENCOUNTER — Ambulatory Visit: Payer: 59 | Admitting: Adult Health

## 2022-11-04 DIAGNOSIS — F4010 Social phobia, unspecified: Secondary | ICD-10-CM | POA: Diagnosis not present

## 2022-11-04 DIAGNOSIS — F410 Panic disorder [episodic paroxysmal anxiety] without agoraphobia: Secondary | ICD-10-CM | POA: Diagnosis not present

## 2022-11-04 DIAGNOSIS — F909 Attention-deficit hyperactivity disorder, unspecified type: Secondary | ICD-10-CM | POA: Diagnosis not present

## 2022-11-04 DIAGNOSIS — F411 Generalized anxiety disorder: Secondary | ICD-10-CM

## 2022-11-04 MED ORDER — AMPHETAMINE-DEXTROAMPHET ER 30 MG PO CP24: 30.0000 mg | ORAL_CAPSULE | Freq: Every day | ORAL | 0 refills | Status: DC

## 2022-11-04 MED ORDER — FLUOXETINE HCL 40 MG PO CAPS: 40.0000 mg | ORAL_CAPSULE | Freq: Every day | ORAL | 3 refills | Status: DC

## 2022-11-04 NOTE — Progress Notes (Signed)
Jocelyn Waters [id number] [birth year] 42 y.o.  Subjective:   Patient ID:  Jocelyn Waters is a 42 y.o. (DOB 1980-11-18) female.  Chief Complaint: No chief complaint on file.   HPI Jocelyn Waters presents to the office today for follow-up of GAD, SAD, panic attacks and ADD.  Describes mood today as "ok". Pleasant. Denies tearfulness. Mood symptoms - denies depression and irritability. Reports anxiety - "nothing uncontrolled". Denies recent panic attacks. Denies worry and rumination. Decreased overthinking and over processing. Mood is consistent. Stating "I'm doing pretty good". Feels like medications are working well. Improved interest and motivation. Taking medications as prescribed.  Energy levels stable. Active, has a regular exercise routine - working with [employer].  Enjoys some usual interests and activities. Married. Lives with husband and 3 children. Sister local. Parents 2 hours away. Spending time with family. Appetite adequate. Weight loss - 141 pounds - loss of pregnancy weight. Sleeps well most nights. Averages 7 to 8 hours. Focus and concentration stable with Adderall. Completing tasks. Managing aspects of household. Works for the [employer] Denies SI or HI.  Denies AH or VH. Denies self harm. Denies substance use.  Previous medication trials: Xanax, Adderall XR, Lorazepam,    Flowsheet Row ED from 10/14/2022 in [facility] at [facility]) ED from 09/10/2022 in [facility] at [facility]) Admission (Discharged) from 02/16/2021 in [facility]  C-SSRS RISK CATEGORY No Risk No Risk No Risk        Review of Systems:  Review of Systems  Musculoskeletal:  Negative for gait problem.  Neurological:  Negative for tremors.  Psychiatric/Behavioral:         Please refer to HPI    Medications: I have reviewed the patient's current medications.  Current Outpatient Medications  Medication Sig  Dispense Refill   amphetamine-dextroamphetamine (ADDERALL XR) 30 MG 24 hr capsule Take 1 capsule (30 mg total) by mouth daily. 30 capsule 0   amphetamine-dextroamphetamine (ADDERALL XR) 30 MG 24 hr capsule Take 1 capsule (30 mg total) by mouth daily. 30 capsule 0   amphetamine-dextroamphetamine (ADDERALL XR) 30 MG 24 hr capsule Take 1 capsule (30 mg total) by mouth daily. 30 capsule 0   cetirizine (ZYRTEC) 10 MG tablet Take 10 mg by mouth daily.     COVID-19 mRNA bivalent vaccine, Pfizer, (PFIZER COVID-19 VAC BIVALENT) injection Inject into the muscle. 0.3 mL 0   FEROSUL 325 (65 Fe) MG tablet Take 325 mg by mouth 2 (two) times daily.     FLUoxetine (PROZAC) 40 MG capsule Take 1 capsule (40 mg total) by mouth daily. 90 capsule 0   ibuprofen (ADVIL) 600 MG tablet Take 1 tablet (600 mg total) by mouth every 6 (six) hours. 30 tablet 0   ondansetron (ZOFRAN-ODT) 8 MG disintegrating tablet Take by mouth.     pantoprazole (PROTONIX) 40 MG tablet Take 1 tablet by mouth daily.     traZODone (DESYREL) 50 MG tablet TAKE 1 TABLET BY MOUTH EVERYDAY AT BEDTIME 90 tablet 0   No current facility-administered medications for this visit.    Medication Side Effects: None  Allergies:  Allergies  Allergen Reactions   Sulfamethoxazole-Trimethoprim     Other reaction(s): rash   Biaxin [Clarithromycin] Rash   Septra [Bactrim] Rash   Sulfa Antibiotics Rash and Other (See Comments)    Past Medical History:  Diagnosis Date   ADD (attention deficit disorder)    Anxiety  Depression    History of abnormal cervical Pap smear 2007   History of gestational hypertension    History of kidney stones 2016   History of pre-eclampsia    History of pregnancy induced hypertension    Hx of varicella    PONV (postoperative nausea and vomiting)    Pregnancy induced hypertension     Past Medical History, Surgical history, Social history, and Family history were reviewed and updated as appropriate.   Please see  review of systems for further details on the patient's review from today.   Objective:   Physical Exam:  There were no vitals taken for this visit.  Physical Exam Constitutional:      General: She is not in acute distress. Musculoskeletal:        General: No deformity.  Neurological:     Mental Status: She is alert and oriented to person, place, and time.     Coordination: Coordination normal.  Psychiatric:        Attention and Perception: Attention and perception normal. She does not perceive auditory or visual hallucinations.        Mood and Affect: Mood normal. Mood is not anxious or depressed. Affect is not labile, blunt, angry or inappropriate.        Speech: Speech normal.        Behavior: Behavior normal.        Thought Content: Thought content normal. Thought content is not paranoid or delusional. Thought content does not include homicidal or suicidal ideation. Thought content does not include homicidal or suicidal plan.        Cognition and Memory: Cognition and memory normal.        Judgment: Judgment normal.     Comments: Insight intact     Lab Review:     Component Value Date/Time   NA 134 (L) 02/16/2021 1748   K 4.2 02/16/2021 1748   CL 104 02/16/2021 1748   CO2 21 (L) 02/16/2021 1748   GLUCOSE 86 02/16/2021 1748   BUN 7 02/16/2021 1748   CREATININE 0.60 02/16/2021 1748   CALCIUM 8.7 (L) 02/16/2021 1748   PROT 5.3 (L) 02/16/2021 1748   ALBUMIN 2.5 (L) 02/16/2021 1748   AST 17 02/16/2021 1748   ALT 12 02/16/2021 1748   ALKPHOS 70 02/16/2021 1748   BILITOT 0.3 02/16/2021 1748   GFRNONAA >60 02/16/2021 1748   GFRAA >60 02/10/2020 1222       Component Value Date/Time   WBC 15.2 (H) 02/18/2021 0511   RBC 3.89 02/18/2021 0511   HGB 10.5 (L) 02/18/2021 0511   HCT 32.6 (L) 02/18/2021 0511   PLT 173 02/18/2021 0511   MCV 83.8 02/18/2021 0511   MCH 27.0 02/18/2021 0511   MCHC 32.2 02/18/2021 0511   RDW 17.5 (H) 02/18/2021 0511   LYMPHSABS 1.4 07/01/2016  1614   MONOABS 0.4 07/01/2016 1614   EOSABS 0.0 07/01/2016 1614   BASOSABS 0.0 07/01/2016 1614    No results found for: "POCLITH", "LITHIUM"   No results found for: "PHENYTOIN", "PHENOBARB", "VALPROATE", "CBMZ"   .res Assessment: Plan:    Plan:  PDMP reviewed  Prozac 40mg  daily Adderall XR 20mg   D/C Trazadone 50mg  at bedtime - not needed.  Monitor BP between visits while taking stimulant medication.   RTC 3 months  Patient advised to contact office with any questions, adverse effects, or acute worsening in signs and symptoms.  Discussed potential benefits, risks, and side effects of stimulants with patient to include  increased heart rate, palpitations, insomnia, increased anxiety, increased irritability, or decreased appetite.  Instructed patient to contact office if experiencing any significant tolerability issues.   There are no diagnoses linked to this encounter.   Please see After Visit Summary for patient specific instructions.  Future Appointments  Date Time Provider [place]  11/04/2022  8:00 AM Yahsir Wickens, Berdie Ogren, NP CP-CP None    No orders of the defined types were placed in this encounter.   -------------------------------

## 2022-12-17 ENCOUNTER — Other Ambulatory Visit (HOSPITAL_COMMUNITY)
Admission: RE | Admit: 2022-12-17 | Discharge: 2022-12-17 | Disposition: A | Discharge status: Home / Self Care | Payer: 59 | Source: Ambulatory Visit | Attending: Nurse Practitioner | Admitting: Nurse Practitioner

## 2022-12-17 ENCOUNTER — Other Ambulatory Visit: Payer: Self-pay | Admitting: Nurse Practitioner

## 2022-12-17 DIAGNOSIS — Z124 Encounter for screening for malignant neoplasm of cervix: Secondary | ICD-10-CM | POA: Diagnosis present

## 2022-12-23 LAB — CYTOLOGY - PAP
Comment: NEGATIVE
Diagnosis: UNDETERMINED — AB
High risk HPV: NEGATIVE

## 2022-12-27 ENCOUNTER — Ambulatory Visit: Payer: 59 | Admitting: Adult Health

## 2023-01-10 ENCOUNTER — Other Ambulatory Visit: Payer: Self-pay

## 2023-01-28 ENCOUNTER — Ambulatory Visit: Payer: 59 | Admitting: Adult Health

## 2023-01-28 ENCOUNTER — Encounter: Payer: Self-pay | Admitting: Adult Health

## 2023-01-28 DIAGNOSIS — F411 Generalized anxiety disorder: Secondary | ICD-10-CM | POA: Diagnosis not present

## 2023-01-28 DIAGNOSIS — F909 Attention-deficit hyperactivity disorder, unspecified type: Secondary | ICD-10-CM | POA: Diagnosis not present

## 2023-01-28 DIAGNOSIS — F4010 Social phobia, unspecified: Secondary | ICD-10-CM

## 2023-01-28 DIAGNOSIS — F410 Panic disorder [episodic paroxysmal anxiety] without agoraphobia: Secondary | ICD-10-CM | POA: Diagnosis not present

## 2023-01-28 MED ORDER — AMPHETAMINE-DEXTROAMPHET ER 30 MG PO CP24
30.0000 mg | ORAL_CAPSULE | Freq: Every day | ORAL | 0 refills | Status: DC
Start: 2023-02-25 — End: 2023-04-30

## 2023-01-28 MED ORDER — AMPHETAMINE-DEXTROAMPHET ER 30 MG PO CP24
30.0000 mg | ORAL_CAPSULE | Freq: Every day | ORAL | 0 refills | Status: DC
Start: 2023-03-25 — End: 2023-04-30

## 2023-01-28 MED ORDER — FLUOXETINE HCL 40 MG PO CAPS: 40.0000 mg | ORAL_CAPSULE | Freq: Every day | ORAL | 3 refills | Status: DC

## 2023-01-28 MED ORDER — AMPHETAMINE-DEXTROAMPHET ER 30 MG PO CP24: 30.0000 mg | ORAL_CAPSULE | Freq: Every day | ORAL | 0 refills | Status: DC

## 2023-01-28 NOTE — Progress Notes (Signed)
Jocelyn Waters [id number] Dec 28, 1980 42 y.o.  Subjective:   Patient ID:  Jocelyn Waters is a 42 y.o. (DOB [birth year]) female.  Chief Complaint: No chief complaint on file.   HPI Jocelyn Waters presents to the office today for follow-up of GAD, SAD, panic attacks and ADD.  Describes mood today as "ok". Pleasant. Denies tearfulness. Mood symptoms - denies depression and irritability. Reports anxiety - "here and there". Denies recent panic attacks. Denies worry,rumination, and over thinking. Mood is consistent. Stating "I'm doing pretty good". Feels like medications are working well. Improved interest and motivation. Taking medications as prescribed.  Energy levels stable. Active, has a regular exercise routine - working with [employer].  Enjoys some usual interests and activities. Married. Lives with husband and 3 - children 8,6, and 2. Sister local. Parents 2 hours away. Spending time with family. Appetite adequate. Weight loss - 137 pounds - loss of pregnancy weight. Sleeps well most nights. Averages 7 to 8 hours. Focus and concentration stable with Adderall. Completing tasks. Managing aspects of household. Works for the [employer]. Denies SI or HI.  Denies AH or VH. Denies self harm. Denies substance use.  Previous medication trials: Xanax, Adderall XR, Lorazepam,    Flowsheet Row ED from 10/14/2022 in [facility] at [facility]) ED from 09/10/2022 in [facility] at [facility]) Admission (Discharged) from 02/16/2021 in [facility]  C-SSRS RISK CATEGORY No Risk No Risk No Risk        Review of Systems:  Review of Systems  Musculoskeletal:  Negative for gait problem.  Neurological:  Negative for tremors.  Psychiatric/Behavioral:         Please refer to HPI    Medications: I have reviewed the patient's current medications.  Current Outpatient Medications  Medication Sig Dispense Refill    amphetamine-dextroamphetamine (ADDERALL XR) 30 MG 24 hr capsule Take 1 capsule (30 mg total) by mouth daily. 30 capsule 0   amphetamine-dextroamphetamine (ADDERALL XR) 30 MG 24 hr capsule Take 1 capsule (30 mg total) by mouth daily. 30 capsule 0   amphetamine-dextroamphetamine (ADDERALL XR) 30 MG 24 hr capsule Take 1 capsule (30 mg total) by mouth daily. 30 capsule 0   cetirizine (ZYRTEC) 10 MG tablet Take 10 mg by mouth daily.     COVID-19 mRNA bivalent vaccine, Pfizer, (PFIZER COVID-19 VAC BIVALENT) injection Inject into the muscle. 0.3 mL 0   FEROSUL 325 (65 Fe) MG tablet Take 325 mg by mouth 2 (two) times daily.     FLUoxetine (PROZAC) 40 MG capsule Take 1 capsule (40 mg total) by mouth daily. 90 capsule 3   ibuprofen (ADVIL) 600 MG tablet Take 1 tablet (600 mg total) by mouth every 6 (six) hours. 30 tablet 0   ondansetron (ZOFRAN-ODT) 8 MG disintegrating tablet Take by mouth.     pantoprazole (PROTONIX) 40 MG tablet Take 1 tablet by mouth daily.     No current facility-administered medications for this visit.    Medication Side Effects: None  Allergies:  Allergies  Allergen Reactions   Sulfamethoxazole-Trimethoprim     Other reaction(s): rash   Biaxin [Clarithromycin] Rash   Septra [Bactrim] Rash   Sulfa Antibiotics Rash and Other (See Comments)    Past Medical History:  Diagnosis Date   ADD (attention deficit disorder)    Anxiety    Depression    History of abnormal cervical Pap smear 2007   History of gestational hypertension  History of kidney stones 2016   History of pre-eclampsia    History of pregnancy induced hypertension    Hx of varicella    PONV (postoperative nausea and vomiting)    Pregnancy induced hypertension     Past Medical History, Surgical history, Social history, and Family history were reviewed and updated as appropriate.   Please see review of systems for further details on the patient's review from today.   Objective:   Physical Exam:   There were no vitals taken for this visit.  Physical Exam Constitutional:      General: She is not in acute distress. Musculoskeletal:        General: No deformity.  Neurological:     Mental Status: She is alert and oriented to person, place, and time.     Coordination: Coordination normal.  Psychiatric:        Attention and Perception: Attention and perception normal. She does not perceive auditory or visual hallucinations.        Mood and Affect: Mood normal. Mood is not anxious or depressed. Affect is not labile, blunt, angry or inappropriate.        Speech: Speech normal.        Behavior: Behavior normal.        Thought Content: Thought content normal. Thought content is not paranoid or delusional. Thought content does not include homicidal or suicidal ideation. Thought content does not include homicidal or suicidal plan.        Cognition and Memory: Cognition and memory normal.        Judgment: Judgment normal.     Comments: Insight intact     Lab Review:     Component Value Date/Time   NA 134 (L) 02/16/2021 1748   K 4.2 02/16/2021 1748   CL 104 02/16/2021 1748   CO2 21 (L) 02/16/2021 1748   GLUCOSE 86 02/16/2021 1748   BUN 7 02/16/2021 1748   CREATININE 0.60 02/16/2021 1748   CALCIUM 8.7 (L) 02/16/2021 1748   PROT 5.3 (L) 02/16/2021 1748   ALBUMIN 2.5 (L) 02/16/2021 1748   AST 17 02/16/2021 1748   ALT 12 02/16/2021 1748   ALKPHOS 70 02/16/2021 1748   BILITOT 0.3 02/16/2021 1748   GFRNONAA >60 02/16/2021 1748   GFRAA >60 02/10/2020 1222       Component Value Date/Time   WBC 15.2 (H) 02/18/2021 0511   RBC 3.89 02/18/2021 0511   HGB 10.5 (L) 02/18/2021 0511   HCT 32.6 (L) 02/18/2021 0511   PLT 173 02/18/2021 0511   MCV 83.8 02/18/2021 0511   MCH 27.0 02/18/2021 0511   MCHC 32.2 02/18/2021 0511   RDW 17.5 (H) 02/18/2021 0511   LYMPHSABS 1.4 07/01/2016 1614   MONOABS 0.4 07/01/2016 1614   EOSABS 0.0 07/01/2016 1614   BASOSABS 0.0 07/01/2016 1614    No  results found for: "POCLITH", "LITHIUM"   No results found for: "PHENYTOIN", "PHENOBARB", "VALPROATE", "CBMZ"   .res Assessment: Plan:    Plan:  PDMP reviewed  Prozac 40mg  daily Adderall XR 20mg   Monitor BP between visits while taking stimulant medication.   RTC 3 months  Patient advised to contact office with any questions, adverse effects, or acute worsening in signs and symptoms.  Discussed potential benefits, risks, and side effects of stimulants with patient to include increased heart rate, palpitations, insomnia, increased anxiety, increased irritability, or decreased appetite.  Instructed patient to contact office if experiencing any significant tolerability issues.   There are no  diagnoses linked to this encounter.   Please see After Visit Summary for patient specific instructions.  Future Appointments  Date Time Provider Department Center  01/28/2023  8:00 AM Timi Reeser, Thereasa Solo, NP CP-CP None    No orders of the defined types were placed in this encounter.   -------------------------------

## 2023-04-30 ENCOUNTER — Encounter: Payer: Self-pay | Admitting: Adult Health

## 2023-04-30 ENCOUNTER — Ambulatory Visit: Payer: 59 | Admitting: Adult Health

## 2023-04-30 DIAGNOSIS — F411 Generalized anxiety disorder: Secondary | ICD-10-CM

## 2023-04-30 DIAGNOSIS — F909 Attention-deficit hyperactivity disorder, unspecified type: Secondary | ICD-10-CM

## 2023-04-30 DIAGNOSIS — F4010 Social phobia, unspecified: Secondary | ICD-10-CM

## 2023-04-30 DIAGNOSIS — F410 Panic disorder [episodic paroxysmal anxiety] without agoraphobia: Secondary | ICD-10-CM | POA: Diagnosis not present

## 2023-04-30 MED ORDER — AMPHETAMINE-DEXTROAMPHET ER 30 MG PO CP24: 30.0000 mg | ORAL_CAPSULE | Freq: Every day | ORAL | 0 refills | Status: DC

## 2023-04-30 MED ORDER — AMPHETAMINE-DEXTROAMPHET ER 30 MG PO CP24
30.0000 mg | ORAL_CAPSULE | Freq: Every day | ORAL | 0 refills | Status: DC
Start: 2023-04-30 — End: 2023-07-23

## 2023-04-30 NOTE — Progress Notes (Signed)
Jocelyn Waters [id number] [birth year] 42 y.o.  Subjective:   Patient ID:  Jocelyn Waters is a 42 y.o. (DOB 10-06-1980) female.  Chief Complaint: No chief complaint on file.   HPI Jocelyn Waters presents to the office today for follow-up of GAD, SAD, panic attacks and ADD.  Describes mood today as "ok". Pleasant. Denies tearfulness. Mood symptoms - denies depression, anxiety and irritability. Denies panic attacks. Denies worry,rumination, and over thinking. Mood is consistent. Stating "I'm feeling pretty nomal". Feels like medications are working well. Improved interest and motivation. Taking medications as prescribed.  Energy levels stable. Active, has a regular exercise routine. Enjoys some usual interests and activities. Married. Lives with husband and 3 - children 8, 6 and 2. Sister local. Parents 2 hours away. Spending time with family. Appetite adequate. Weight loss - 126 from 137 pounds Sleeps well most nights. Averages 7 to 8 hours. Focus and concentration stable with Adderall. Completing tasks. Managing aspects of household. 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 Denies SI or HI.  Denies AH or VH. Denies self harm. Denies substance use.  Previous medication trials: Xanax, Adderall XR, Lorazepam,    Flowsheet Row ED from 10/14/2022 in [facility] Ur[facility] at [facility]) ED from 09/10/2022 in [facility] at [facility]) Admission (Discharged) from 02/16/2021 in [facility]  C-SSRS RISK CATEGORY No Risk No Risk No Risk        Review of Systems:  Review of Systems  Musculoskeletal:  Negative for gait problem.  Neurological:  Negative for tremors.  Psychiatric/Behavioral:         Please refer to HPI    Medications: I have reviewed the patient's current medications.  Current Outpatient Medications  Medication Sig Dispense Refill   amphetamine-dextroamphetamine (ADDERALL XR) 30 MG 24 hr capsule Take 1 capsule (30 mg  total) by mouth daily. 30 capsule 0   amphetamine-dextroamphetamine (ADDERALL XR) 30 MG 24 hr capsule Take 1 capsule (30 mg total) by mouth daily. 30 capsule 0   amphetamine-dextroamphetamine (ADDERALL XR) 30 MG 24 hr capsule Take 1 capsule (30 mg total) by mouth daily. 30 capsule 0   cetirizine (ZYRTEC) 10 MG tablet Take 10 mg by mouth daily.     COVID-19 mRNA bivalent vaccine, Pfizer, (PFIZER COVID-19 VAC BIVALENT) injection Inject into the muscle. 0.3 mL 0   FEROSUL 325 (65 Fe) MG tablet Take 325 mg by mouth 2 (two) times daily.     FLUoxetine (PROZAC) 40 MG capsule Take 1 capsule (40 mg total) by mouth daily. 90 capsule 3   ibuprofen (ADVIL) 600 MG tablet Take 1 tablet (600 mg total) by mouth every 6 (six) hours. 30 tablet 0   ondansetron (ZOFRAN-ODT) 8 MG disintegrating tablet Take by mouth.     pantoprazole (PROTONIX) 40 MG tablet Take 1 tablet by mouth daily.     No current facility-administered medications for this visit.    Medication Side Effects: None  Allergies:  Allergies  Allergen Reactions   Sulfamethoxazole-Trimethoprim     Other reaction(s): rash   Biaxin [Clarithromycin] Rash   Septra [Bactrim] Rash   Sulfa Antibiotics Rash and Other (See Comments)    Past Medical History:  Diagnosis Date   ADD (attention deficit disorder)    Anxiety    Depression    History of abnormal cervical Pap smear 2007   History of gestational hypertension    History of kidney stones 2016   History of  pre-eclampsia    History of pregnancy induced hypertension    Hx of varicella    PONV (postoperative nausea and vomiting)    Pregnancy induced hypertension     Past Medical History, Surgical history, Social history, and Family history were reviewed and updated as appropriate.   Please see review of systems for further details on the patient's review from today.   Objective:   Physical Exam:  There were no vitals taken for this visit.  Physical Exam Constitutional:       General: She is not in acute distress. Musculoskeletal:        General: No deformity.  Neurological:     Mental Status: She is alert and oriented to person, place, and time.     Coordination: Coordination normal.  Psychiatric:        Attention and Perception: Attention and perception normal. She does not perceive auditory or visual hallucinations.        Mood and Affect: Affect is not labile, blunt, angry or inappropriate.        Speech: Speech normal.        Behavior: Behavior normal.        Thought Content: Thought content normal. Thought content is not paranoid or delusional. Thought content does not include homicidal or suicidal ideation. Thought content does not include homicidal or suicidal plan.        Cognition and Memory: Cognition and memory normal.        Judgment: Judgment normal.     Comments: Insight intact     Lab Review:     Component Value Date/Time   NA 134 (L) 02/16/2021 1748   K 4.2 02/16/2021 1748   CL 104 02/16/2021 1748   CO2 21 (L) 02/16/2021 1748   GLUCOSE 86 02/16/2021 1748   BUN 7 02/16/2021 1748   CREATININE 0.60 02/16/2021 1748   CALCIUM 8.7 (L) 02/16/2021 1748   PROT 5.3 (L) 02/16/2021 1748   ALBUMIN 2.5 (L) 02/16/2021 1748   AST 17 02/16/2021 1748   ALT 12 02/16/2021 1748   ALKPHOS 70 02/16/2021 1748   BILITOT 0.3 02/16/2021 1748   GFRNONAA >60 02/16/2021 1748   GFRAA >60 02/10/2020 1222       Component Value Date/Time   WBC 15.2 (H) 02/18/2021 0511   RBC 3.89 02/18/2021 0511   HGB 10.5 (L) 02/18/2021 0511   HCT 32.6 (L) 02/18/2021 0511   PLT 173 02/18/2021 0511   MCV 83.8 02/18/2021 0511   MCH 27.0 02/18/2021 0511   MCHC 32.2 02/18/2021 0511   RDW 17.5 (H) 02/18/2021 0511   LYMPHSABS 1.4 07/01/2016 1614   MONOABS 0.4 07/01/2016 1614   EOSABS 0.0 07/01/2016 1614   BASOSABS 0.0 07/01/2016 1614    No results found for: "POCLITH", "LITHIUM"   No results found for: "PHENYTOIN", "PHENOBARB", "VALPROATE", "CBMZ"   .res Assessment:  Plan:    Plan:  PDMP reviewed  Prozac 40mg  daily Adderall XR 20mg   Monitor BP between visits while taking stimulant medication.   RTC 3 months  Patient advised to contact office with any questions, adverse effects, or acute worsening in signs and symptoms.  Discussed potential benefits, risks, and side effects of stimulants with patient to include increased heart rate, palpitations, insomnia, increased anxiety, increased irritability, or decreased appetite.  Instructed patient to contact office if experiencing any significant tolerability issues.   There are no diagnoses linked to this encounter.   Please see After Visit Summary for patient specific instructions.  No future appointments.  No orders of the defined types were placed in this encounter.   -------------------------------

## 2023-07-23 ENCOUNTER — Encounter: Payer: Self-pay | Admitting: Adult Health

## 2023-07-23 ENCOUNTER — Ambulatory Visit: Payer: 59 | Admitting: Adult Health

## 2023-07-23 DIAGNOSIS — F4010 Social phobia, unspecified: Secondary | ICD-10-CM

## 2023-07-23 DIAGNOSIS — F410 Panic disorder [episodic paroxysmal anxiety] without agoraphobia: Secondary | ICD-10-CM | POA: Diagnosis not present

## 2023-07-23 DIAGNOSIS — F909 Attention-deficit hyperactivity disorder, unspecified type: Secondary | ICD-10-CM

## 2023-07-23 DIAGNOSIS — F411 Generalized anxiety disorder: Secondary | ICD-10-CM

## 2023-07-23 MED ORDER — AMPHETAMINE-DEXTROAMPHET ER 30 MG PO CP24
30.0000 mg | ORAL_CAPSULE | Freq: Every day | ORAL | 0 refills | Status: DC
Start: 2023-08-20 — End: 2023-10-21

## 2023-07-23 MED ORDER — AMPHETAMINE-DEXTROAMPHET ER 30 MG PO CP24
30.0000 mg | ORAL_CAPSULE | Freq: Every day | ORAL | 0 refills | Status: DC
Start: 2023-09-17 — End: 2023-10-21

## 2023-07-23 MED ORDER — AMPHETAMINE-DEXTROAMPHET ER 30 MG PO CP24
30.0000 mg | ORAL_CAPSULE | Freq: Every day | ORAL | 0 refills | Status: DC
Start: 2023-07-23 — End: 2023-10-21

## 2023-07-23 NOTE — Progress Notes (Signed)
Jocelyn Waters [id number] 09/09/1980 42 y.o.  Subjective:   Patient ID:  Jocelyn Waters is a 42 y.o. (DOB Jul 28, 1981) female.  Chief Complaint: No chief complaint on file.   HPI Jocelyn Waters presents to the office today for follow-up of GAD, SAD, panic attacks and ADD.  Describes mood today as "ok". Pleasant. Denies tearfulness. Mood symptoms - denies depression, anxiety and irritability. Denies panic attacks. Denies worry,rumination, and over thinking. Mood is consistent. Stating "I feel like I'm doing really well". Feels like medications are working well. Stable interest and motivation. Taking medications as prescribed.  Energy levels stable. Active, has a regular exercise routine. Enjoys some usual interests and activities. Married. Lives with husband and 3 - children 8, 6 and 2. Sister local. Parents 2 hours away. Spending time with family. Appetite adequate. Weight stable - 125 pounds Sleeps well most nights. Averages 7 to 8 hours. Focus and concentration stable with Adderall. Completing tasks. Managing aspects of household. 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 Denies SI or HI.  Denies AH or VH. Denies self harm. Denies substance use.  Previous medication trials: Xanax, Adderall XR, Lorazepam,     Flowsheet Row ED from 10/14/2022 in [facility] at [facility]) ED from 09/10/2022 in [facility] at [facility]) Admission (Discharged) from 02/16/2021 in [facility]  C-SSRS RISK CATEGORY No Risk No Risk No Risk        Review of Systems:  Review of Systems  Musculoskeletal:  Negative for gait problem.  Neurological:  Negative for tremors.  Psychiatric/Behavioral:         Please refer to HPI    Medications: I have reviewed the patient's current medications.  Current Outpatient Medications  Medication Sig Dispense Refill   amphetamine-dextroamphetamine (ADDERALL XR) 30 MG 24 hr capsule Take 1 capsule (30 mg  total) by mouth daily. 30 capsule 0   [START ON 08/20/2023] amphetamine-dextroamphetamine (ADDERALL XR) 30 MG 24 hr capsule Take 1 capsule (30 mg total) by mouth daily. 30 capsule 0   [START ON 09/17/2023] amphetamine-dextroamphetamine (ADDERALL XR) 30 MG 24 hr capsule Take 1 capsule (30 mg total) by mouth daily. 30 capsule 0   cetirizine (ZYRTEC) 10 MG tablet Take 10 mg by mouth daily.     COVID-19 mRNA bivalent vaccine, Pfizer, (PFIZER COVID-19 VAC BIVALENT) injection Inject into the muscle. 0.3 mL 0   FEROSUL 325 (65 Fe) MG tablet Take 325 mg by mouth 2 (two) times daily.     FLUoxetine (PROZAC) 40 MG capsule Take 1 capsule (40 mg total) by mouth daily. 90 capsule 3   ibuprofen (ADVIL) 600 MG tablet Take 1 tablet (600 mg total) by mouth every 6 (six) hours. 30 tablet 0   ondansetron (ZOFRAN-ODT) 8 MG disintegrating tablet Take by mouth.     pantoprazole (PROTONIX) 40 MG tablet Take 1 tablet by mouth daily.     No current facility-administered medications for this visit.    Medication Side Effects: None  Allergies:  Allergies  Allergen Reactions   Sulfamethoxazole-Trimethoprim     Other reaction(s): rash   Biaxin [Clarithromycin] Rash   Septra [Bactrim] Rash   Sulfa Antibiotics Rash and Other (See Comments)    Past Medical History:  Diagnosis Date   ADD (attention deficit disorder)    Anxiety    Depression    History of abnormal cervical Pap smear 2007   History of gestational hypertension    History  of kidney stones 2016   History of pre-eclampsia    History of pregnancy induced hypertension    Hx of varicella    PONV (postoperative nausea and vomiting)    Pregnancy induced hypertension     Past Medical History, Surgical history, Social history, and Family history were reviewed and updated as appropriate.   Please see review of systems for further details on the patient's review from today.   Objective:   Physical Exam:  There were no vitals taken for this  visit.  Physical Exam Constitutional:      General: She is not in acute distress. Musculoskeletal:        General: No deformity.  Neurological:     Mental Status: She is alert and oriented to person, place, and time.     Coordination: Coordination normal.  Psychiatric:        Attention and Perception: Attention and perception normal. She does not perceive auditory or visual hallucinations.        Mood and Affect: Mood normal. Mood is not anxious or depressed. Affect is not labile, blunt, angry or inappropriate.        Speech: Speech normal.        Behavior: Behavior normal.        Thought Content: Thought content normal. Thought content is not paranoid or delusional. Thought content does not include homicidal or suicidal ideation. Thought content does not include homicidal or suicidal plan.        Cognition and Memory: Cognition and memory normal.        Judgment: Judgment normal.     Comments: Insight intact     Lab Review:     Component Value Date/Time   NA 134 (L) 02/16/2021 1748   K 4.2 02/16/2021 1748   CL 104 02/16/2021 1748   CO2 21 (L) 02/16/2021 1748   GLUCOSE 86 02/16/2021 1748   BUN 7 02/16/2021 1748   CREATININE 0.60 02/16/2021 1748   CALCIUM 8.7 (L) 02/16/2021 1748   PROT 5.3 (L) 02/16/2021 1748   ALBUMIN 2.5 (L) 02/16/2021 1748   AST 17 02/16/2021 1748   ALT 12 02/16/2021 1748   ALKPHOS 70 02/16/2021 1748   BILITOT 0.3 02/16/2021 1748   GFRNONAA >60 02/16/2021 1748   GFRAA >60 02/10/2020 1222       Component Value Date/Time   WBC 15.2 (H) 02/18/2021 0511   RBC 3.89 02/18/2021 0511   HGB 10.5 (L) 02/18/2021 0511   HCT 32.6 (L) 02/18/2021 0511   PLT 173 02/18/2021 0511   MCV 83.8 02/18/2021 0511   MCH 27.0 02/18/2021 0511   MCHC 32.2 02/18/2021 0511   RDW 17.5 (H) 02/18/2021 0511   LYMPHSABS 1.4 07/01/2016 1614   MONOABS 0.4 07/01/2016 1614   EOSABS 0.0 07/01/2016 1614   BASOSABS 0.0 07/01/2016 1614    No results found for: "POCLITH", "LITHIUM"    No results found for: "PHENYTOIN", "PHENOBARB", "VALPROATE", "CBMZ"   .res Assessment: Plan:    Plan:  PDMP reviewed  Prozac 40mg  daily Adderall XR 30mg   Monitor BP between visits while taking stimulant medication.   RTC 3 months  Patient advised to contact office with any questions, adverse effects, or acute worsening in signs and symptoms.  Discussed potential benefits, risks, and side effects of stimulants with patient to include increased heart rate, palpitations, insomnia, increased anxiety, increased irritability, or decreased appetite.  Instructed patient to contact office if experiencing any significant tolerability issues.   Diagnoses and all orders  for this visit:  Attention deficit hyperactivity disorder (ADHD), unspecified ADHD type -     amphetamine-dextroamphetamine (ADDERALL XR) 30 MG 24 hr capsule; Take 1 capsule (30 mg total) by mouth daily. -     amphetamine-dextroamphetamine (ADDERALL XR) 30 MG 24 hr capsule; Take 1 capsule (30 mg total) by mouth daily. -     amphetamine-dextroamphetamine (ADDERALL XR) 30 MG 24 hr capsule; Take 1 capsule (30 mg total) by mouth daily.     Please see After Visit Summary for patient specific instructions.  No future appointments.   No orders of the defined types were placed in this encounter.   -------------------------------

## 2023-10-21 ENCOUNTER — Ambulatory Visit: Payer: 59 | Admitting: Adult Health

## 2023-10-21 ENCOUNTER — Encounter: Payer: Self-pay | Admitting: Adult Health

## 2023-10-21 DIAGNOSIS — F410 Panic disorder [episodic paroxysmal anxiety] without agoraphobia: Secondary | ICD-10-CM

## 2023-10-21 DIAGNOSIS — F4010 Social phobia, unspecified: Secondary | ICD-10-CM

## 2023-10-21 DIAGNOSIS — F411 Generalized anxiety disorder: Secondary | ICD-10-CM | POA: Diagnosis not present

## 2023-10-21 DIAGNOSIS — F909 Attention-deficit hyperactivity disorder, unspecified type: Secondary | ICD-10-CM

## 2023-10-21 MED ORDER — BUPROPION HCL ER (XL) 150 MG PO TB24: 150.0000 mg | ORAL_TABLET | Freq: Every day | ORAL | 2 refills | Status: DC

## 2023-10-21 MED ORDER — AMPHETAMINE-DEXTROAMPHET ER 30 MG PO CP24: 30.0000 mg | ORAL_CAPSULE | Freq: Every day | ORAL | 0 refills | Status: DC

## 2023-10-21 MED ORDER — FLUOXETINE HCL 40 MG PO CAPS: 40.0000 mg | ORAL_CAPSULE | Freq: Every day | ORAL | 3 refills | Status: AC

## 2023-10-21 MED ORDER — AMPHETAMINE-DEXTROAMPHET ER 30 MG PO CP24
30.0000 mg | ORAL_CAPSULE | Freq: Every day | ORAL | 0 refills | Status: DC
Start: 2023-11-18 — End: 2024-01-22

## 2023-10-21 MED ORDER — AMPHETAMINE-DEXTROAMPHET ER 30 MG PO CP24
30.0000 mg | ORAL_CAPSULE | Freq: Every day | ORAL | 0 refills | Status: DC
Start: 2023-10-21 — End: 2024-01-22

## 2023-10-21 NOTE — Progress Notes (Signed)
 Jocelyn Waters [id number] [birth year] 43 y.o.  Virtual Visit via Video Note  I connected with pt @ on 10/21/23 at  8:30 AM EDT by a video enabled telemedicine application and verified that I am speaking with the correct person using two identifiers.   I discussed the limitations of evaluation and management by telemedicine and the availability of in person appointments. The patient expressed understanding and agreed to proceed.  I discussed the assessment and treatment plan with the patient. The patient was provided an opportunity to ask questions and all were answered. The patient agreed with the plan and demonstrated an understanding of the instructions.   The patient was advised to call back or seek an in-person evaluation if the symptoms worsen or if the condition fails to improve as anticipated.  I provided 25 minutes of non-face-to-face time during this encounter.  The patient was located at home.  The provider was located at [facility].   Dorothyann Gibbs, NP   Subjective:   Patient ID:  Jocelyn Waters is a 43 y.o. (DOB 12-15-1980) female.  Chief Complaint: No chief complaint on file.   HPI Pina L Bearman presents for follow-up of GAD, SAD, panic attacks and ADD.  Describes mood today as "so-so". Pleasant. Reports tearfulness. Mood symptoms - reports depression, anxiety and irritability. Reports decreased interest and motivation. Denies panic attacks. Reports some worry, rumination, and over thinking. Reports situational stressors - job change - family. Mood is lower. Stating "I'm not doing as well as I had been". Taking medications as prescribed.  Energy levels lower. Active, has a regular exercise routine. Enjoys some usual interests and activities. Married. Lives with husband and 3 - children. Sister local. Parents 2 hours away. Spending time with family. Appetite adequate. Weight stable - 125 pounds Sleeps better some nights than others. Averages 7 to 8 hours of  broken sleep. Focus and concentration stable with Adderall. Completing tasks. Managing aspects of household. 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 Denies SI or HI.  Denies AH or VH. Denies self harm. Denies substance use.  Previous medication trials: Xanax, Adderall XR, Lorazepam, Wellbutrin XL 150mg   Review of Systems:  Review of Systems  Musculoskeletal:  Negative for gait problem.  Neurological:  Negative for tremors.  Psychiatric/Behavioral:         Please refer to HPI    Medications: I have reviewed the patient's current medications.  Current Outpatient Medications  Medication Sig Dispense Refill   amphetamine-dextroamphetamine (ADDERALL XR) 30 MG 24 hr capsule Take 1 capsule (30 mg total) by mouth daily. 30 capsule 0   amphetamine-dextroamphetamine (ADDERALL XR) 30 MG 24 hr capsule Take 1 capsule (30 mg total) by mouth daily. 30 capsule 0   amphetamine-dextroamphetamine (ADDERALL XR) 30 MG 24 hr capsule Take 1 capsule (30 mg total) by mouth daily. 30 capsule 0   cetirizine (ZYRTEC) 10 MG tablet Take 10 mg by mouth daily.     COVID-19 mRNA bivalent vaccine, Pfizer, (PFIZER COVID-19 VAC BIVALENT) injection Inject into the muscle. 0.3 mL 0   FEROSUL 325 (65 Fe) MG tablet Take 325 mg by mouth 2 (two) times daily.     FLUoxetine (PROZAC) 40 MG capsule Take 1 capsule (40 mg total) by mouth daily. 90 capsule 3   ibuprofen (ADVIL) 600 MG tablet Take 1 tablet (600 mg total) by mouth every 6 (six) hours. 30 tablet 0   ondansetron (ZOFRAN-ODT) 8 MG disintegrating tablet Take by mouth.     pantoprazole (  PROTONIX) 40 MG tablet Take 1 tablet by mouth daily.     No current facility-administered medications for this visit.    Medication Side Effects: None  Allergies:  Allergies  Allergen Reactions   Sulfamethoxazole-Trimethoprim     Other reaction(s): rash   Biaxin [Clarithromycin] Rash   Septra [Bactrim] Rash   Sulfa Antibiotics Rash and Other (See Comments)    Past Medical  History:  Diagnosis Date   ADD (attention deficit disorder)    Anxiety    Depression    History of abnormal cervical Pap smear 2007   History of gestational hypertension    History of kidney stones 2016   History of pre-eclampsia    History of pregnancy induced hypertension    Hx of varicella    PONV (postoperative nausea and vomiting)    Pregnancy induced hypertension     Family History  Problem Relation Age of Onset   Diabetes Maternal Aunt    Hypertension Maternal Grandmother    Hypertension Paternal Grandmother    Diabetes Paternal Grandfather     Social History   Socioeconomic History   Marital status: Married    Spouse name: Not on file   Number of children: Not on file   Years of education: Not on file   Highest education level: Not on file  Occupational History   Not on file  Tobacco Use   Smoking status: Former    Current packs/day: 0.00    Types: Cigarettes    Start date: 08/31/1997    Quit date: 08/31/2012    Years since quitting: 11.1   Smokeless tobacco: Never  Vaping Use   Vaping status: Former   Quit date: 10/11/2012  Substance and Sexual Activity   Alcohol use: Not Currently    Comment: SOCIALY   Drug use: No   Sexual activity: Yes    Partners: Male    Birth control/protection: None  Other Topics Concern   Not on file  Social History Narrative   Not on file   Social Drivers of Health   Financial Resource Strain: Not on file  Food Insecurity: Not on file  Transportation Needs: Not on file  Physical Activity: Not on file  Stress: Not on file  Social Connections: Not on file  Intimate Partner Violence: Not on file    Past Medical History, Surgical history, Social history, and Family history were reviewed and updated as appropriate.   Please see review of systems for further details on the patient's review from today.   Objective:   Physical Exam:  There were no vitals taken for this visit.  Physical Exam Constitutional:       General: She is not in acute distress. Musculoskeletal:        General: No deformity.  Neurological:     Mental Status: She is alert and oriented to person, place, and time.     Coordination: Coordination normal.  Psychiatric:        Attention and Perception: Attention and perception normal. She does not perceive auditory or visual hallucinations.        Mood and Affect: Mood normal. Mood is not anxious or depressed. Affect is not labile, blunt, angry or inappropriate.        Speech: Speech normal.        Behavior: Behavior normal.        Thought Content: Thought content normal. Thought content is not paranoid or delusional. Thought content does not include homicidal or suicidal ideation. Thought content  does not include homicidal or suicidal plan.        Cognition and Memory: Cognition and memory normal.        Judgment: Judgment normal.     Comments: Insight intact     Lab Review:     Component Value Date/Time   NA 134 (L) 02/16/2021 1748   K 4.2 02/16/2021 1748   CL 104 02/16/2021 1748   CO2 21 (L) 02/16/2021 1748   GLUCOSE 86 02/16/2021 1748   BUN 7 02/16/2021 1748   CREATININE 0.60 02/16/2021 1748   CALCIUM 8.7 (L) 02/16/2021 1748   PROT 5.3 (L) 02/16/2021 1748   ALBUMIN 2.5 (L) 02/16/2021 1748   AST 17 02/16/2021 1748   ALT 12 02/16/2021 1748   ALKPHOS 70 02/16/2021 1748   BILITOT 0.3 02/16/2021 1748   GFRNONAA >60 02/16/2021 1748   GFRAA >60 02/10/2020 1222       Component Value Date/Time   WBC 15.2 (H) 02/18/2021 0511   RBC 3.89 02/18/2021 0511   HGB 10.5 (L) 02/18/2021 0511   HCT 32.6 (L) 02/18/2021 0511   PLT 173 02/18/2021 0511   MCV 83.8 02/18/2021 0511   MCH 27.0 02/18/2021 0511   MCHC 32.2 02/18/2021 0511   RDW 17.5 (H) 02/18/2021 0511   LYMPHSABS 1.4 07/01/2016 1614   MONOABS 0.4 07/01/2016 1614   EOSABS 0.0 07/01/2016 1614   BASOSABS 0.0 07/01/2016 1614    No results found for: "POCLITH", "LITHIUM"   No results found for: "PHENYTOIN",  "PHENOBARB", "VALPROATE", "CBMZ"   .res Assessment: Plan:    Plan:  PDMP reviewed  Add Wellbutrin XL 150mg  every morning - denies seizure history.  Prozac 40mg  daily Adderall XR 30mg   Monitor BP between visits while taking stimulant medication.   RTC 3 months  Patient advised to contact office with any questions, adverse effects, or acute worsening in signs and symptoms.  Discussed potential benefits, risks, and side effects of stimulants with patient to include increased heart rate, palpitations, insomnia, increased anxiety, increased irritability, or decreased appetite.  Instructed patient to contact office if experiencing any significant tolerability issues.  There are no diagnoses linked to this encounter.   Please see After Visit Summary for patient specific instructions.  Future Appointments  Date Time Provider Department Center  10/21/2023  8:30 AM Arlita Buffkin, Thereasa Solo, NP [facility] None    No orders of the defined types were placed in this encounter.     -------------------------------

## 2023-11-12 ENCOUNTER — Other Ambulatory Visit: Payer: Self-pay | Admitting: Adult Health

## 2023-11-12 DIAGNOSIS — F909 Attention-deficit hyperactivity disorder, unspecified type: Secondary | ICD-10-CM

## 2024-01-22 ENCOUNTER — Ambulatory Visit: Admitting: Adult Health

## 2024-01-22 ENCOUNTER — Encounter: Payer: Self-pay | Admitting: Adult Health

## 2024-01-22 DIAGNOSIS — F411 Generalized anxiety disorder: Secondary | ICD-10-CM | POA: Diagnosis not present

## 2024-01-22 DIAGNOSIS — F410 Panic disorder [episodic paroxysmal anxiety] without agoraphobia: Secondary | ICD-10-CM | POA: Diagnosis not present

## 2024-01-22 DIAGNOSIS — F4010 Social phobia, unspecified: Secondary | ICD-10-CM

## 2024-01-22 DIAGNOSIS — G4700 Insomnia, unspecified: Secondary | ICD-10-CM

## 2024-01-22 DIAGNOSIS — F909 Attention-deficit hyperactivity disorder, unspecified type: Secondary | ICD-10-CM

## 2024-01-22 MED ORDER — AMPHETAMINE-DEXTROAMPHET ER 30 MG PO CP24: 30.0000 mg | ORAL_CAPSULE | Freq: Every day | ORAL | 0 refills | Status: DC

## 2024-01-22 MED ORDER — BUPROPION HCL ER (XL) 150 MG PO TB24: 150.0000 mg | ORAL_TABLET | Freq: Every day | ORAL | 3 refills | Status: AC

## 2024-01-22 NOTE — Progress Notes (Signed)
 [name] [id number] 13-May-1981 43 y.o.  Subjective:   Patient ID:  Jocelyn Waters is a 43 y.o. (DOB 1981-03-18) female.  Chief Complaint: No chief complaint on file.   HPI [name] presents to the office today for follow-up of GAD, SAD, panic attacks and ADD.  Describes mood today as ok. Pleasant. Reports tearfulness. Mood symptoms - denies depression and anxiety. Reports irritability at times. Reports improved interest and motivation. Denies panic attacks. Reports occasional worry, rumination and over thinking. Reports situational stressors - job. Reports mood is stable. Stating I feel like I'm doing better than I was. Taking medications as prescribed.  Energy levels improved. Active, has a regular exercise routine. Enjoys some usual interests and activities. Married. Lives with husband and 3 - children. Sister local. Parents 2 hours away. Spending time with family. Appetite adequate. Weight stable - 125 pounds Reports sleep has improved. Averages 7 to 8 hours. Focus and concentration stable with Adderall. Completing tasks. Managing aspects of household. 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 Denies SI or HI.  Denies AH or VH. Denies self harm. Denies substance use.  Previous medication trials: Xanax , Adderall XR, Lorazepam, Wellbutrin  XL 150mg    Flowsheet Row UC from 10/14/2022 in [facility] at [facility]) UC from 09/10/2022 in [facility] at [facility]) Admission (Discharged) from 02/16/2021 in [facility]  C-SSRS RISK CATEGORY No Risk No Risk No Risk     Review of Systems:  Review of Systems  Musculoskeletal:  Negative for gait problem.  Neurological:  Negative for tremors.  Psychiatric/Behavioral:         Please refer to HPI    Medications: I have reviewed the patient's current medications.  Current Outpatient Medications  Medication Sig Dispense Refill   amphetamine -dextroamphetamine  (ADDERALL XR) 30 MG 24 hr capsule Take 1 capsule (30 mg total) by mouth daily. 30 capsule 0   amphetamine -dextroamphetamine (ADDERALL XR) 30 MG 24 hr capsule Take 1 capsule (30 mg total) by mouth daily. 30 capsule 0   amphetamine -dextroamphetamine (ADDERALL XR) 30 MG 24 hr capsule Take 1 capsule (30 mg total) by mouth daily. 30 capsule 0   buPROPion  (WELLBUTRIN  XL) 150 MG 24 hr tablet TAKE 1 TABLET BY MOUTH EVERY DAY 90 tablet 0   cetirizine (ZYRTEC) 10 MG tablet Take 10 mg by mouth daily.     COVID-19 mRNA bivalent vaccine, Pfizer, (PFIZER COVID-19 VAC BIVALENT) injection Inject into the muscle. 0.3 mL 0   FEROSUL 325 (65 Fe) MG tablet Take 325 mg by mouth 2 (two) times daily.     FLUoxetine  (PROZAC ) 40 MG capsule Take 1 capsule (40 mg total) by mouth daily. 90 capsule 3   ibuprofen  (ADVIL ) 600 MG tablet Take 1 tablet (600 mg total) by mouth every 6 (six) hours. 30 tablet 0   ondansetron  (ZOFRAN -ODT) 8 MG disintegrating tablet Take by mouth.     pantoprazole (PROTONIX) 40 MG tablet Take 1 tablet by mouth daily.     No current facility-administered medications for this visit.    Medication Side Effects: None  Allergies:  Allergies  Allergen Reactions   Sulfamethoxazole-Trimethoprim     Other reaction(s): rash   Biaxin [Clarithromycin] Rash   Septra [Bactrim] Rash   Sulfa Antibiotics Rash and Other (See Comments)    Past Medical History:  Diagnosis Date   ADD (attention deficit disorder)    Anxiety    Depression    History  of abnormal cervical Pap smear 2007   History of gestational hypertension    History of kidney stones 2016   History of pre-eclampsia    History of pregnancy induced hypertension    Hx of varicella    PONV (postoperative nausea and vomiting)    Pregnancy induced hypertension     Past Medical History, Surgical history, Social history, and Family history were reviewed and updated as appropriate.   Please see review of systems for further details on the  patient's review from today.   Objective:   Physical Exam:  There were no vitals taken for this visit.  Physical Exam Constitutional:      General: She is not in acute distress.  Musculoskeletal:        General: No deformity.   Neurological:     Mental Status: She is alert and oriented to person, place, and time.     Coordination: Coordination normal.   Psychiatric:        Attention and Perception: Attention and perception normal. She does not perceive auditory or visual hallucinations.        Mood and Affect: Mood normal. Mood is not anxious or depressed. Affect is not labile, blunt, angry or inappropriate.        Speech: Speech normal.        Behavior: Behavior normal.        Thought Content: Thought content normal. Thought content is not paranoid or delusional. Thought content does not include homicidal or suicidal ideation. Thought content does not include homicidal or suicidal plan.        Cognition and Memory: Cognition and memory normal.        Judgment: Judgment normal.     Comments: Insight intact     Lab Review:     Component Value Date/Time   NA 134 (L) 02/16/2021 1748   K 4.2 02/16/2021 1748   CL 104 02/16/2021 1748   CO2 21 (L) 02/16/2021 1748   GLUCOSE 86 02/16/2021 1748   BUN 7 02/16/2021 1748   CREATININE 0.60 02/16/2021 1748   CALCIUM  8.7 (L) 02/16/2021 1748   PROT 5.3 (L) 02/16/2021 1748   ALBUMIN 2.5 (L) 02/16/2021 1748   AST 17 02/16/2021 1748   ALT 12 02/16/2021 1748   ALKPHOS 70 02/16/2021 1748   BILITOT 0.3 02/16/2021 1748   GFRNONAA >60 02/16/2021 1748   GFRAA >60 02/10/2020 1222       Component Value Date/Time   WBC 15.2 (H) 02/18/2021 0511   RBC 3.89 02/18/2021 0511   HGB 10.5 (L) 02/18/2021 0511   HCT 32.6 (L) 02/18/2021 0511   PLT 173 02/18/2021 0511   MCV 83.8 02/18/2021 0511   MCH 27.0 02/18/2021 0511   MCHC 32.2 02/18/2021 0511   RDW 17.5 (H) 02/18/2021 0511   LYMPHSABS 1.4 07/01/2016 1614   MONOABS 0.4 07/01/2016 1614    EOSABS 0.0 07/01/2016 1614   BASOSABS 0.0 07/01/2016 1614    No results found for: POCLITH, LITHIUM   No results found for: PHENYTOIN, PHENOBARB, VALPROATE, CBMZ   .res Assessment: Plan:    Plan:  PDMP reviewed  Wellbutrin  XL 150mg  every morning - denies seizure history. Prozac  40mg  daily Adderall XR 30mg   Monitor BP between visits while taking stimulant medication.   RTC 3 months  Patient advised to contact office with any questions, adverse effects, or acute worsening in signs and symptoms.  Discussed potential benefits, risks, and side effects of stimulants with patient to include increased heart  rate, palpitations, insomnia, increased anxiety, increased irritability, or decreased appetite.  Instructed patient to contact office if experiencing any significant tolerability issues.  There are no diagnoses linked to this encounter.   Please see After Visit Summary for patient specific instructions.  Future Appointments  Date Time Provider Department Center  01/22/2024  8:00 AM Dhwani Venkatesh Nattalie, NP [facility] None    No orders of the defined types were placed in this encounter.   -------------------------------

## 2024-04-21 ENCOUNTER — Encounter: Payer: Self-pay | Admitting: Adult Health

## 2024-04-21 ENCOUNTER — Ambulatory Visit: Admitting: Adult Health

## 2024-04-21 DIAGNOSIS — F909 Attention-deficit hyperactivity disorder, unspecified type: Secondary | ICD-10-CM

## 2024-04-21 DIAGNOSIS — F411 Generalized anxiety disorder: Secondary | ICD-10-CM | POA: Diagnosis not present

## 2024-04-21 DIAGNOSIS — F410 Panic disorder [episodic paroxysmal anxiety] without agoraphobia: Secondary | ICD-10-CM

## 2024-04-21 DIAGNOSIS — F4010 Social phobia, unspecified: Secondary | ICD-10-CM | POA: Diagnosis not present

## 2024-04-21 MED ORDER — AMPHETAMINE-DEXTROAMPHET ER 30 MG PO CP24: 30.0000 mg | ORAL_CAPSULE | Freq: Every day | ORAL | 0 refills | Status: DC

## 2024-04-21 MED ORDER — BUSPIRONE HCL 10 MG PO TABS: 10.0000 mg | ORAL_TABLET | Freq: Two times a day (BID) | ORAL | 2 refills | Status: DC

## 2024-04-21 NOTE — Progress Notes (Signed)
 Jocelyn Waters [id number] 03-Jun-1981 43 y.o.  Subjective:   Patient ID:  Jocelyn Waters is a 43 y.o. (DOB 1980/09/09) female.  Chief Complaint: No chief complaint on file.   HPI Jocelyn Waters presents to the office today for follow-up of GAD, SAD, panic attacks and ADD.  Describes mood today as ok. Pleasant. Reports tearfulness. Mood symptoms - denies depression. Reports increased anxiety in the afternoons - not sure why. Reports worsening social anxiety. Reports irritability at times - more snippy than normal. Reports stable interest and motivation - more so around the house. Denies panic attacks. Reports increased worry, rumination and over thinking. Reports situational stressors - job. Reports mood is stable. Stating I feel like I'm doing ok, not as good as it was. Taking medications as prescribed.  Energy levels stable. Active, has a regular exercise routine - walking daily.. Enjoys some usual interests and activities. Married. Lives with husband and 3 - children. Sister local. Parents 2 hours away. Spending time with family. Appetite adequate. Weight stable - 125 pounds Reports sleeping better some nights than others. Averages 5 to 6 hours. Focus and concentration stable with Adderall. Completing tasks. Managing aspects of household. 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 Denies SI or HI.  Denies AH or VH. Denies self harm. Denies substance use.  Previous medication trials: Xanax , Adderall XR, Lorazepam, Wellbutrin  XL 150mg    Flowsheet Row UC from 10/14/2022 in [facility]) UC from 09/10/2022 in [facility] at [facility]) Admission (Discharged) from 02/16/2021 in [facility]  C-SSRS RISK CATEGORY No Risk No Risk No Risk     Review of Systems:  Review of Systems  Musculoskeletal:  Negative for gait problem.  Neurological:  Negative for tremors.  Psychiatric/Behavioral:         Please refer  to HPI    Medications: I have reviewed the patient's current medications.  Current Outpatient Medications  Medication Sig Dispense Refill   amphetamine -dextroamphetamine (ADDERALL XR) 30 MG 24 hr capsule Take 1 capsule (30 mg total) by mouth daily. 30 capsule 0   amphetamine -dextroamphetamine (ADDERALL XR) 30 MG 24 hr capsule Take 1 capsule (30 mg total) by mouth daily. 30 capsule 0   amphetamine -dextroamphetamine (ADDERALL XR) 30 MG 24 hr capsule Take 1 capsule (30 mg total) by mouth daily. 30 capsule 0   buPROPion  (WELLBUTRIN  XL) 150 MG 24 hr tablet Take 1 tablet (150 mg total) by mouth daily. 90 tablet 3   cetirizine (ZYRTEC) 10 MG tablet Take 10 mg by mouth daily.     COVID-19 mRNA bivalent vaccine, Pfizer, (PFIZER COVID-19 VAC BIVALENT) injection Inject into the muscle. 0.3 mL 0   FEROSUL 325 (65 Fe) MG tablet Take 325 mg by mouth 2 (two) times daily.     FLUoxetine  (PROZAC ) 40 MG capsule Take 1 capsule (40 mg total) by mouth daily. 90 capsule 3   ibuprofen  (ADVIL ) 600 MG tablet Take 1 tablet (600 mg total) by mouth every 6 (six) hours. 30 tablet 0   ondansetron  (ZOFRAN -ODT) 8 MG disintegrating tablet Take by mouth.     pantoprazole (PROTONIX) 40 MG tablet Take 1 tablet by mouth daily.     No current facility-administered medications for this visit.    Medication Side Effects: None  Allergies:  Allergies  Allergen Reactions   Sulfamethoxazole-Trimethoprim     Other reaction(s): rash   Biaxin [Clarithromycin] Rash   Septra [Bactrim] Rash  Sulfa Antibiotics Rash and Other (See Comments)    Past Medical History:  Diagnosis Date   ADD (attention deficit disorder)    Anxiety    Depression    History of abnormal cervical Pap smear 2007   History of gestational hypertension    History of kidney stones 2016   History of pre-eclampsia    History of pregnancy induced hypertension    Hx of varicella    PONV (postoperative nausea and vomiting)    Pregnancy induced hypertension      Past Medical History, Surgical history, Social history, and Family history were reviewed and updated as appropriate.   Please see review of systems for further details on the patient's review from today.   Objective:   Physical Exam:  There were no vitals taken for this visit.  Physical Exam Constitutional:      General: She is not in acute distress. Musculoskeletal:        General: No deformity.  Neurological:     Mental Status: She is alert and oriented to person, place, and time.     Coordination: Coordination normal.  Psychiatric:        Attention and Perception: Attention and perception normal. She does not perceive auditory or visual hallucinations.        Mood and Affect: Mood normal. Mood is not anxious or depressed. Affect is not labile, blunt, angry or inappropriate.        Speech: Speech normal.        Behavior: Behavior normal.        Thought Content: Thought content normal. Thought content is not paranoid or delusional. Thought content does not include homicidal or suicidal ideation. Thought content does not include homicidal or suicidal plan.        Cognition and Memory: Cognition and memory normal.        Judgment: Judgment normal.     Comments: Insight intact     Lab Review:     Component Value Date/Time   NA 134 (L) 02/16/2021 1748   K 4.2 02/16/2021 1748   CL 104 02/16/2021 1748   CO2 21 (L) 02/16/2021 1748   GLUCOSE 86 02/16/2021 1748   BUN 7 02/16/2021 1748   CREATININE 0.60 02/16/2021 1748   CALCIUM  8.7 (L) 02/16/2021 1748   PROT 5.3 (L) 02/16/2021 1748   ALBUMIN 2.5 (L) 02/16/2021 1748   AST 17 02/16/2021 1748   ALT 12 02/16/2021 1748   ALKPHOS 70 02/16/2021 1748   BILITOT 0.3 02/16/2021 1748   GFRNONAA >60 02/16/2021 1748   GFRAA >60 02/10/2020 1222       Component Value Date/Time   WBC 15.2 (H) 02/18/2021 0511   RBC 3.89 02/18/2021 0511   HGB 10.5 (L) 02/18/2021 0511   HCT 32.6 (L) 02/18/2021 0511   PLT 173 02/18/2021 0511   MCV  83.8 02/18/2021 0511   MCH 27.0 02/18/2021 0511   MCHC 32.2 02/18/2021 0511   RDW 17.5 (H) 02/18/2021 0511   LYMPHSABS 1.4 07/01/2016 1614   MONOABS 0.4 07/01/2016 1614   EOSABS 0.0 07/01/2016 1614   BASOSABS 0.0 07/01/2016 1614    No results found for: POCLITH, LITHIUM   No results found for: PHENYTOIN, PHENOBARB, VALPROATE, CBMZ   .res Assessment: Plan:    Plan:  PDMP reviewed  Wellbutrin  XL 150mg  every morning - denies seizure history. Prozac  40mg  daily Adderall XR 30mg  daily  Add Buspar  10mg  BID for anxiety  Monitor BP between visits while taking stimulant medication.  RTC 3 months  Patient advised to contact office with any questions, adverse effects, or acute worsening in signs and symptoms.  Discussed potential benefits, risks, and side effects of stimulants with patient to include increased heart rate, palpitations, insomnia, increased anxiety, increased irritability, or decreased appetite.  Instructed patient to contact office if experiencing any significant tolerability issues.   There are no diagnoses linked to this encounter.   Please see After Visit Summary for patient specific instructions.  Future Appointments  Date Time Provider Department Center  04/21/2024  8:00 AM Lylianna Fraiser Nattalie, NP CP-CP None    No orders of the defined types were placed in this encounter.   -------------------------------

## 2024-05-14 ENCOUNTER — Other Ambulatory Visit: Payer: Self-pay | Admitting: Adult Health

## 2024-05-14 DIAGNOSIS — F411 Generalized anxiety disorder: Secondary | ICD-10-CM

## 2024-07-21 ENCOUNTER — Encounter: Payer: Self-pay | Admitting: Adult Health

## 2024-07-21 ENCOUNTER — Ambulatory Visit: Admitting: Adult Health

## 2024-07-21 DIAGNOSIS — F4010 Social phobia, unspecified: Secondary | ICD-10-CM | POA: Diagnosis not present

## 2024-07-21 DIAGNOSIS — F411 Generalized anxiety disorder: Secondary | ICD-10-CM

## 2024-07-21 DIAGNOSIS — F410 Panic disorder [episodic paroxysmal anxiety] without agoraphobia: Secondary | ICD-10-CM | POA: Diagnosis not present

## 2024-07-21 DIAGNOSIS — F909 Attention-deficit hyperactivity disorder, unspecified type: Secondary | ICD-10-CM | POA: Diagnosis not present

## 2024-07-21 MED ORDER — AMPHETAMINE-DEXTROAMPHET ER 30 MG PO CP24: 30.0000 mg | ORAL_CAPSULE | Freq: Every day | ORAL | 0 refills | Status: AC

## 2024-07-21 MED ORDER — BUSPIRONE HCL 10 MG PO TABS: 10.0000 mg | ORAL_TABLET | Freq: Two times a day (BID) | ORAL | 1 refills | Status: AC

## 2024-07-21 NOTE — Progress Notes (Signed)
 Jocelyn Waters [id number] 14-Nov-1980 43 y.o.  Subjective:   Patient ID:  Jocelyn Waters is a 43 y.o. (DOB [birth year]) female.  Chief Complaint: No chief complaint on file.   HPI Jocelyn Waters presents to the office today for follow-up of GAD, SAD, panic attacks and ADD.  Describes mood today as ok. Pleasant. Reports tearfulness. Mood symptoms - denies depression. Reports some anxiety - addition of Buspar  has been helpful. Reports decreased social anxiety. Denies irritability - that is better. Reports stable interest and motivation. Denies panic attacks. Reports decreased worry, rumination and over thinking. Reports situational stressors - job. Reports mood is stable. Stating I feel like I'm doing better. Taking medications as prescribed.  Energy levels stable. Active, has a regular exercise routine - walking daily. Enjoys some usual interests and activities. Married. Lives with husband and 3 - children - 3 dogs. Sister local. Parents 2 hours away. Spending time with family. Appetite adequate. Weight loss  - 120 - 125 pounds Reports sleeping better some nights than others. Averages 5 to 6 hours. Focus and concentration stable with Adderall. Completing tasks. Managing aspects of household. 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 Denies SI or HI.  Denies AH or VH. Denies self harm. Denies substance use.  Previous medication trials: Xanax , Adderall XR, Lorazepam, Wellbutrin  XL 150mg    Flowsheet Row UC from 10/14/2022 in [facility] at [facility]) UC from 09/10/2022 in [facility] at [facility]) Admission (Discharged) from 02/16/2021 in [facility]  C-SSRS RISK CATEGORY No Risk No Risk No Risk     Review of Systems:  Review of Systems  Musculoskeletal:  Negative for gait problem.  Neurological:  Negative for tremors.  Psychiatric/Behavioral:         Please refer to HPI    Medications: I have reviewed the patient's  current medications.  Current Outpatient Medications  Medication Sig Dispense Refill   amphetamine -dextroamphetamine (ADDERALL XR) 30 MG 24 hr capsule Take 1 capsule (30 mg total) by mouth daily. 30 capsule 0   amphetamine -dextroamphetamine (ADDERALL XR) 30 MG 24 hr capsule Take 1 capsule (30 mg total) by mouth daily. 30 capsule 0   amphetamine -dextroamphetamine (ADDERALL XR) 30 MG 24 hr capsule Take 1 capsule (30 mg total) by mouth daily. 30 capsule 0   buPROPion  (WELLBUTRIN  XL) 150 MG 24 hr tablet Take 1 tablet (150 mg total) by mouth daily. 90 tablet 3   busPIRone  (BUSPAR ) 10 MG tablet TAKE 1 TABLET BY MOUTH TWICE A DAY 180 tablet 0   cetirizine (ZYRTEC) 10 MG tablet Take 10 mg by mouth daily.     COVID-19 mRNA bivalent vaccine, Pfizer, (PFIZER COVID-19 VAC BIVALENT) injection Inject into the muscle. 0.3 mL 0   FEROSUL 325 (65 Fe) MG tablet Take 325 mg by mouth 2 (two) times daily.     FLUoxetine  (PROZAC ) 40 MG capsule Take 1 capsule (40 mg total) by mouth daily. 90 capsule 3   ibuprofen  (ADVIL ) 600 MG tablet Take 1 tablet (600 mg total) by mouth every 6 (six) hours. 30 tablet 0   ondansetron  (ZOFRAN -ODT) 8 MG disintegrating tablet Take by mouth.     pantoprazole (PROTONIX) 40 MG tablet Take 1 tablet by mouth daily.     No current facility-administered medications for this visit.    Medication Side Effects: None  Allergies: Allergies[1]  Past Medical History:  Diagnosis Date   ADD (attention deficit disorder)    Anxiety  Depression    History of abnormal cervical Pap smear 2007   History of gestational hypertension    History of kidney stones 2016   History of pre-eclampsia    History of pregnancy induced hypertension    Hx of varicella    PONV (postoperative nausea and vomiting)    Pregnancy induced hypertension     Past Medical History, Surgical history, Social history, and Family history were reviewed and updated as appropriate.   Please see review of systems for  further details on the patient's review from today.   Objective:   Physical Exam:  There were no vitals taken for this visit.  Physical Exam Constitutional:      General: She is not in acute distress. Musculoskeletal:        General: No deformity.  Neurological:     Mental Status: She is alert and oriented to person, place, and time.     Coordination: Coordination normal.  Psychiatric:        Attention and Perception: Attention and perception normal. She does not perceive auditory or visual hallucinations.        Mood and Affect: Mood normal. Mood is not anxious or depressed. Affect is not labile, blunt, angry or inappropriate.        Speech: Speech normal.        Behavior: Behavior normal.        Thought Content: Thought content normal. Thought content is not paranoid or delusional. Thought content does not include homicidal or suicidal ideation. Thought content does not include homicidal or suicidal plan.        Cognition and Memory: Cognition and memory normal.        Judgment: Judgment normal.     Comments: Insight intact     Lab Review:     Component Value Date/Time   NA 134 (L) 02/16/2021 1748   K 4.2 02/16/2021 1748   CL 104 02/16/2021 1748   CO2 21 (L) 02/16/2021 1748   GLUCOSE 86 02/16/2021 1748   BUN 7 02/16/2021 1748   CREATININE 0.60 02/16/2021 1748   CALCIUM  8.7 (L) 02/16/2021 1748   PROT 5.3 (L) 02/16/2021 1748   ALBUMIN 2.5 (L) 02/16/2021 1748   AST 17 02/16/2021 1748   ALT 12 02/16/2021 1748   ALKPHOS 70 02/16/2021 1748   BILITOT 0.3 02/16/2021 1748   GFRNONAA >60 02/16/2021 1748   GFRAA >60 02/10/2020 1222       Component Value Date/Time   WBC 15.2 (H) 02/18/2021 0511   RBC 3.89 02/18/2021 0511   HGB 10.5 (L) 02/18/2021 0511   HCT 32.6 (L) 02/18/2021 0511   PLT 173 02/18/2021 0511   MCV 83.8 02/18/2021 0511   MCH 27.0 02/18/2021 0511   MCHC 32.2 02/18/2021 0511   RDW 17.5 (H) 02/18/2021 0511   LYMPHSABS 1.4 07/01/2016 1614   MONOABS 0.4  07/01/2016 1614   EOSABS 0.0 07/01/2016 1614   BASOSABS 0.0 07/01/2016 1614    No results found for: POCLITH, LITHIUM   No results found for: PHENYTOIN, PHENOBARB, VALPROATE, CBMZ   .res Assessment: Plan:    Plan:  PDMP reviewed  Wellbutrin  XL 150mg  every morning - denies seizure history. Prozac  40mg  daily Adderall XR 30mg  daily Buspar  10mg  BID for anxiety  Monitor BP between visits while taking stimulant medication.   RTC 3 months  Patient advised to contact office with any questions, adverse effects, or acute worsening in signs and symptoms.  Discussed potential benefits, risks, and side effects  of stimulants with patient to include increased heart rate, palpitations, insomnia, increased anxiety, increased irritability, or decreased appetite.  Instructed patient to contact office if experiencing any significant tolerability issues.    There are no diagnoses linked to this encounter.   Please see After Visit Summary for patient specific instructions.  Future Appointments  Date Time Provider Department Center  07/21/2024  8:00 AM Zaharah Amir Nattalie, NP CP-CP None    No orders of the defined types were placed in this encounter.   -------------------------------     [1]  Allergies Allergen Reactions   Sulfamethoxazole-Trimethoprim     Other reaction(s): rash   Biaxin [Clarithromycin] Rash   Septra [Bactrim] Rash   Sulfa Antibiotics Rash and Other (See Comments)

## 2024-08-25 ENCOUNTER — Emergency Department (HOSPITAL_BASED_OUTPATIENT_CLINIC_OR_DEPARTMENT_OTHER)

## 2024-08-25 ENCOUNTER — Other Ambulatory Visit: Payer: Self-pay

## 2024-08-25 ENCOUNTER — Emergency Department (HOSPITAL_BASED_OUTPATIENT_CLINIC_OR_DEPARTMENT_OTHER)
Admission: EM | Admit: 2024-08-25 | Discharge: 2024-08-25 | Disposition: A | Discharge status: Home / Self Care | Attending: Emergency Medicine | Admitting: Emergency Medicine

## 2024-08-25 DIAGNOSIS — R10A1 Flank pain, right side: Secondary | ICD-10-CM

## 2024-08-25 DIAGNOSIS — R1011 Right upper quadrant pain: Secondary | ICD-10-CM | POA: Diagnosis present

## 2024-08-25 LAB — COMPREHENSIVE METABOLIC PANEL WITH GFR
ALT: 35 U/L (ref 0–44)
AST: 21 U/L (ref 15–41)
Albumin: 4.5 g/dL (ref 3.5–5.0)
Alkaline Phosphatase: 45 U/L (ref 38–126)
Anion gap: 9 (ref 5–15)
BUN: 16 mg/dL (ref 6–20)
CO2: 28 mmol/L (ref 22–32)
Calcium: 9.5 mg/dL (ref 8.9–10.3)
Chloride: 101 mmol/L (ref 98–111)
Creatinine, Ser: 0.62 mg/dL (ref 0.44–1.00)
GFR, Estimated: >60 mL/min
Glucose, Bld: 80 mg/dL (ref 70–99)
Potassium: 4.3 mmol/L (ref 3.5–5.1)
Sodium: 138 mmol/L (ref 135–145)
Total Bilirubin: 0.4 mg/dL (ref 0.0–1.2)
Total Protein: 6.9 g/dL (ref 6.5–8.1)

## 2024-08-25 LAB — CBC WITH DIFFERENTIAL/PLATELET
Abs Immature Granulocytes: 0.03 K/uL (ref 0.00–0.07)
Basophils Absolute: 0 K/uL (ref 0.0–0.1)
Basophils Relative: 0 %
Eosinophils Absolute: 0.1 K/uL (ref 0.0–0.5)
Eosinophils Relative: 1 %
HCT: 37.1 % (ref 36.0–46.0)
Hemoglobin: 12.7 g/dL (ref 12.0–15.0)
Immature Granulocytes: 1 %
Lymphocytes Relative: 27 %
Lymphs Abs: 1.7 K/uL (ref 0.7–4.0)
MCH: 30.3 pg (ref 26.0–34.0)
MCHC: 34.2 g/dL (ref 30.0–36.0)
MCV: 88.5 fL (ref 80.0–100.0)
Monocytes Absolute: 0.5 K/uL (ref 0.1–1.0)
Monocytes Relative: 8 %
Neutro Abs: 4.1 K/uL (ref 1.7–7.7)
Neutrophils Relative %: 63 %
Platelets: 233 K/uL (ref 150–400)
RBC: 4.19 MIL/uL (ref 3.87–5.11)
RDW: 12.4 % (ref 11.5–15.5)
WBC: 6.5 K/uL (ref 4.0–10.5)
nRBC: 0 % (ref 0.0–0.2)

## 2024-08-25 LAB — URINALYSIS, ROUTINE W REFLEX MICROSCOPIC
Bacteria, UA: NONE SEEN
Bilirubin Urine: NEGATIVE
Glucose, UA: NEGATIVE mg/dL
Ketones, ur: NEGATIVE mg/dL
Leukocytes,Ua: NEGATIVE
Nitrite: NEGATIVE
Specific Gravity, Urine: 1.023 (ref 1.005–1.030)
pH: 6.5 (ref 5.0–8.0)

## 2024-08-25 LAB — HCG, SERUM, QUALITATIVE: Preg, Serum: NEGATIVE

## 2024-08-25 MED ORDER — IOHEXOL 300 MG/ML  SOLN
100.0000 mL | Freq: Once | INTRAMUSCULAR | Status: AC | PRN
  Administered 2024-08-25: 100 mL via INTRAVENOUS

## 2024-08-25 NOTE — Discharge Instructions (Addendum)
 Your CT scan overall was unremarkable, you have a small kidney stone in your left kidney but that will not cause pain unless it tries to pass down your ureter. Use Tylenol  and ibuprofen  as needed for pain.  Follow-up with your local physician for next [facility] management.  Your blood work is unremarkable.

## 2024-08-25 NOTE — ED Provider Notes (Signed)
 " [facility] EMERGENC[facility] DEPARTMENT AT [facility] Provider Note   CSN: [id number] Arrival date & time: 08/25/24  9190     Patient presents with: Abdominal Pain   Jocelyn Waters is a 44 y.o. female.   Patient presents with right upper abdominal pain intermittent but gradually worsening since went to holiday.  Patient had gallbladder and appendix removed in the past.  Patient is on Wegovy.  Patient denies any shortness of breath or cough.  No recent significant weight loss.  Not specific worsening with any foods.  No injuries.  No estrogen use, no leg swelling.  Worse anterior right flank near gallbladder area.  The history is provided by the patient.  Abdominal Pain Associated symptoms: no chest pain, no chills, no dysuria, no fever, no shortness of breath and no vomiting        Prior to Admission medications  Medication Sig Start Date End Date Taking? Authorizing Provider  amphetamine -dextroamphetamine (ADDERALL XR) 30 MG 24 hr capsule Take 1 capsule (30 mg total) by mouth daily. 07/21/24   Mozingo, Regina Nattalie, NP  amphetamine -dextroamphetamine (ADDERALL XR) 30 MG 24 hr capsule Take 1 capsule (30 mg total) by mouth daily. 08/18/24   Mozingo, Regina Nattalie, NP  amphetamine -dextroamphetamine (ADDERALL XR) 30 MG 24 hr capsule Take 1 capsule (30 mg total) by mouth daily. 09/15/24   Mozingo, Regina Nattalie, NP  buPROPion  (WELLBUTRIN  XL) 150 MG 24 hr tablet Take 1 tablet (150 mg total) by mouth daily. 01/22/24   Mozingo, Regina Nattalie, NP  busPIRone  (BUSPAR ) 10 MG tablet Take 1 tablet (10 mg total) by mouth 2 (two) times daily. 07/21/24   Mozingo, Regina Nattalie, NP  cetirizine (ZYRTEC) 10 MG tablet Take 10 mg by mouth daily.    [provider]  COVID-19 mRNA bivalent vaccine, Pfizer, (PFIZER COVID-19 VAC BIVALENT) injection Inject into the muscle. 05/25/21   Luiz Channel, MD  FEROSUL 325 (65 Fe) MG tablet Take 325 mg by mouth 2 (two) times daily. 12/18/20    [provider]  FLUoxetine  (PROZAC ) 40 MG capsule Take 1 capsule (40 mg total) by mouth daily. 10/21/23   Mozingo, Regina Nattalie, NP  ibuprofen  (ADVIL ) 600 MG tablet Take 1 tablet (600 mg total) by mouth every 6 (six) hours. 02/20/21   Mont[place] , Jade, FNP  ondansetron  (ZOFRAN -ODT) 8 MG disintegrating tablet Take by mouth. 01/08/21   [provider]  pantoprazole (PROTONIX) 40 MG tablet Take 1 tablet by mouth daily. 11/05/20   [provider]    Allergies: Sulfamethoxazole-trimethoprim, Biaxin [clarithromycin], Septra [bactrim], and Sulfa antibiotics    Review of Systems  Constitutional:  Negative for chills and fever.  HENT:  Negative for congestion.   Eyes:  Negative for visual disturbance.  Respiratory:  Negative for shortness of breath.   Cardiovascular:  Negative for chest pain.  Gastrointestinal:  Positive for abdominal pain. Negative for vomiting.  Genitourinary:  Negative for dysuria and flank pain.  Musculoskeletal:  Negative for back pain, neck pain and neck stiffness.  Skin:  Negative for rash.  Neurological:  Negative for light-headedness and headaches.    Updated Vital Signs BP 104/61   Pulse 89   Temp 98.5 F (36.9 C)   Resp 16   SpO2 100%   Physical Exam Vitals and nursing note reviewed.  Constitutional:      General: She is not in acute distress.    Appearance: She is well-developed.  HENT:     Head: Normocephalic and atraumatic.  Mouth/Throat:     Mouth: Mucous membranes are moist.  Eyes:     General:        Right eye: No discharge.        Left eye: No discharge.     Conjunctiva/sclera: Conjunctivae normal.  Neck:     Trachea: No tracheal deviation.  Cardiovascular:     Rate and Rhythm: Normal rate and regular rhythm.     Heart sounds: No murmur heard. Pulmonary:     Effort: Pulmonary effort is normal.     Breath sounds: Normal breath sounds.  Abdominal:     General: There is no distension.     Palpations: Abdomen is  soft.     Tenderness: There is abdominal tenderness in the right upper quadrant. There is no guarding.  Musculoskeletal:     Cervical back: Normal range of motion and neck supple. No rigidity.  Skin:    General: Skin is warm.     Capillary Refill: Capillary refill takes less than 2 seconds.     Findings: No rash.  Neurological:     General: No focal deficit present.     Mental Status: She is alert.     Cranial Nerves: No cranial nerve deficit.  Psychiatric:        Mood and Affect: Mood normal.     (all labs ordered are listed, but only abnormal results are displayed) Labs Reviewed  URINALYSIS, ROUTINE W REFLEX MICROSCOPIC - Abnormal; Notable for the following components:      Result Value   Hgb urine dipstick TRACE (*)    Protein, ur TRACE (*)    All other components within normal limits  HCG, SERUM, QUALITATIVE  COMPREHENSIVE METABOLIC PANEL WITH GFR  CBC WITH DIFFERENTIAL/PLATELET    EKG: None  Radiology: CT ABDOMEN PELVIS W CONTRAST Result Date: 08/25/2024 CLINICAL DATA:  Right upper quadrant abdominal pain for 1 month EXAM: CT ABDOMEN AND PELVIS WITH CONTRAST TECHNIQUE: Multidetector CT imaging of the abdomen and pelvis was performed using the standard protocol following bolus administration of intravenous contrast. RADIATION DOSE REDUCTION: This exam was performed according to the departmental dose-optimization program which includes automated exposure control, adjustment of the mA and/or kV according to patient size and/or use of iterative reconstruction technique. CONTRAST:  OMNIPAQUE  IOHEXOL  300 MG/ML  SOLN COMPARISON:  February 10, 2020 FINDINGS: Lower chest: No acute abnormality. Hepatobiliary: No focal liver abnormality is seen. Status post cholecystectomy. No biliary dilatation. Pancreas: Unremarkable. No pancreatic ductal dilatation or surrounding inflammatory changes. Spleen: Normal in size without focal abnormality. Adrenals/Urinary Tract: Adrenal glands are  unremarkable. Small nonobstructive left renal calculus is noted. No hydronephrosis or renal obstruction is noted. Bladder is unremarkable. Stomach/Bowel: Stomach is unremarkable. Status post appendectomy. No evidence of bowel obstruction inflammation. Vascular/Lymphatic: No significant vascular findings are present. No enlarged abdominal or pelvic lymph nodes. Reproductive: Uterus and bilateral adnexa are unremarkable. Other: No abdominal wall hernia or abnormality. No abdominopelvic ascites. Musculoskeletal: No acute or significant osseous findings. IMPRESSION: 1. Small nonobstructive left renal calculus. No hydronephrosis or renal obstruction is noted. 2. No other abnormality seen in the abdomen or pelvis. Electronically Signed   By: Lynwood Landy Raddle M.D.   On: 08/25/2024 10:10   DG Chest Portable 1 View Result Date: 08/25/2024 CLINICAL DATA:  Right lower flank pain. EXAM: PORTABLE CHEST 1 VIEW COMPARISON:  The lungs are clear without focal pneumonia, edema, pneumothorax or pleural effusion. The cardiopericardial silhouette is within normal limits for size. No acute bony  abnormality. FINDINGS: The heart size and mediastinal contours are within normal limits. Both lungs are clear. The visualized skeletal structures are unremarkable. IMPRESSION: No active disease. Electronically Signed   By: Camellia Candle M.D.   On: 08/25/2024 09:00     Procedures   Medications Ordered in the ED  iohexol  (OMNIPAQUE ) 300 MG/ML solution 100 mL (100 mLs Intravenous Contrast Given 08/25/24 0943)                                    Medical Decision Making Amount and/or Complexity of Data Reviewed Labs: ordered. Radiology: ordered.  Risk Prescription drug management.   Patient presents with worsening right upper quadrant abdominal discomfort differential includes liver pathology, lower lung pathology, musculoskeletal, kidney stone, malignancy, colon related, other.  Patient denies any blood clot risk factors.  Vital  signs normal.  Patient well-appearing in the room does not want pain meds at this time.  With worsening signs symptoms plan for blood work, urinalysis, x-ray of the chest, CT abdomen pelvis for further delineation of pain.  Patient comfortable plan.  Blood work independent reviewed unremarkable normal kidney function, electrolytes and liver function unremarkable.  CT scan results independently reviewed no acute abnormalities, small left kidney stone but this in the kidney itself.  Patient stable for outpatient follow-up.  Mild hemoglobin in the urine no signs of infection.     Final diagnoses:  Acute right flank pain    ED Discharge Orders     None          Tonia Chew, MD 08/25/24 1135  "

## 2024-08-25 NOTE — ED Triage Notes (Signed)
 Reports R sided abd pain. States tender to touch. Denies n/v/d Does not have gallbladder or appendix.

## 2024-09-06 ENCOUNTER — Encounter: Payer: Self-pay | Admitting: Adult Health

## 2024-09-06 ENCOUNTER — Telehealth: Admitting: Adult Health

## 2024-09-06 DIAGNOSIS — F909 Attention-deficit hyperactivity disorder, unspecified type: Secondary | ICD-10-CM

## 2024-09-06 DIAGNOSIS — G4700 Insomnia, unspecified: Secondary | ICD-10-CM

## 2024-09-06 DIAGNOSIS — F410 Panic disorder [episodic paroxysmal anxiety] without agoraphobia: Secondary | ICD-10-CM

## 2024-09-06 DIAGNOSIS — F411 Generalized anxiety disorder: Secondary | ICD-10-CM

## 2024-09-06 DIAGNOSIS — F4010 Social phobia, unspecified: Secondary | ICD-10-CM

## 2024-09-10 ENCOUNTER — Telehealth: Payer: Self-pay

## 2024-10-18 ENCOUNTER — Ambulatory Visit: Admitting: Adult Health

## 2024-10-22 ENCOUNTER — Ambulatory Visit: Admitting: Adult Health
